# Patient Record
Sex: Female | Born: 1945 | Race: White | Hispanic: No | Marital: Married | Smoking: Never smoker
Health system: Southern US, Community
[De-identification: ages and names within clinical notes are randomized; demographics above are authoritative.]

## PROBLEM LIST (undated history)

## (undated) DIAGNOSIS — R112 Nausea with vomiting, unspecified: Secondary | ICD-10-CM

## (undated) DIAGNOSIS — G20 Parkinson's disease: Secondary | ICD-10-CM

## (undated) DIAGNOSIS — J45909 Unspecified asthma, uncomplicated: Secondary | ICD-10-CM

## (undated) DIAGNOSIS — J189 Pneumonia, unspecified organism: Secondary | ICD-10-CM

## (undated) DIAGNOSIS — M1990 Unspecified osteoarthritis, unspecified site: Secondary | ICD-10-CM

## (undated) DIAGNOSIS — G709 Myoneural disorder, unspecified: Secondary | ICD-10-CM

## (undated) DIAGNOSIS — E039 Hypothyroidism, unspecified: Secondary | ICD-10-CM

## (undated) DIAGNOSIS — Z9889 Other specified postprocedural states: Secondary | ICD-10-CM

## (undated) DIAGNOSIS — E079 Disorder of thyroid, unspecified: Secondary | ICD-10-CM

## (undated) DIAGNOSIS — G459 Transient cerebral ischemic attack, unspecified: Secondary | ICD-10-CM

## (undated) DIAGNOSIS — I639 Cerebral infarction, unspecified: Secondary | ICD-10-CM

## (undated) DIAGNOSIS — T783XXA Angioneurotic edema, initial encounter: Secondary | ICD-10-CM

## (undated) DIAGNOSIS — G20C Parkinsonism, unspecified: Secondary | ICD-10-CM

## (undated) DIAGNOSIS — L509 Urticaria, unspecified: Secondary | ICD-10-CM

## (undated) DIAGNOSIS — I10 Essential (primary) hypertension: Secondary | ICD-10-CM

## (undated) HISTORY — PX: TONSILLECTOMY: SUR1361

## (undated) HISTORY — DX: Disorder of thyroid, unspecified: E07.9

## (undated) HISTORY — DX: Urticaria, unspecified: L50.9

## (undated) HISTORY — PX: KNEE SURGERY: SHX244

## (undated) HISTORY — DX: Parkinson's disease: G20

## (undated) HISTORY — DX: Parkinsonism, unspecified: G20.C

## (undated) HISTORY — DX: Angioneurotic edema, initial encounter: T78.3XXA

## (undated) HISTORY — DX: Essential (primary) hypertension: I10

## (undated) HISTORY — PX: DILATION AND CURETTAGE OF UTERUS: SHX78

## (undated) HISTORY — DX: Transient cerebral ischemic attack, unspecified: G45.9

## (undated) HISTORY — PX: HYSTEROSCOPY: SHX211

---

## 1999-03-23 ENCOUNTER — Ambulatory Visit (HOSPITAL_COMMUNITY): Admission: RE | Admit: 1999-03-23 | Discharge: 1999-03-23 | Payer: Self-pay | Admitting: Neurological Surgery

## 1999-03-23 ENCOUNTER — Encounter: Payer: Self-pay | Admitting: Neurological Surgery

## 1999-04-01 ENCOUNTER — Encounter: Payer: Self-pay | Admitting: Neurological Surgery

## 1999-04-01 ENCOUNTER — Encounter: Admission: RE | Admit: 1999-04-01 | Discharge: 1999-04-01 | Payer: Self-pay | Admitting: Neurological Surgery

## 1999-05-12 HISTORY — PX: BACK SURGERY: SHX140

## 1999-05-14 ENCOUNTER — Encounter: Payer: Self-pay | Admitting: Neurological Surgery

## 1999-05-14 ENCOUNTER — Ambulatory Visit (HOSPITAL_COMMUNITY): Admission: RE | Admit: 1999-05-14 | Discharge: 1999-05-15 | Payer: Self-pay | Admitting: Neurological Surgery

## 1999-09-03 ENCOUNTER — Encounter: Payer: Self-pay | Admitting: Neurological Surgery

## 1999-09-03 ENCOUNTER — Ambulatory Visit (HOSPITAL_COMMUNITY): Admission: RE | Admit: 1999-09-03 | Discharge: 1999-09-03 | Payer: Self-pay | Admitting: Neurological Surgery

## 1999-11-21 ENCOUNTER — Encounter: Payer: Self-pay | Admitting: Neurological Surgery

## 1999-11-21 ENCOUNTER — Ambulatory Visit (HOSPITAL_COMMUNITY): Admission: RE | Admit: 1999-11-21 | Discharge: 1999-11-21 | Payer: Self-pay | Admitting: Neurological Surgery

## 1999-12-29 ENCOUNTER — Inpatient Hospital Stay (HOSPITAL_COMMUNITY): Admission: RE | Admit: 1999-12-29 | Discharge: 2000-01-05 | Payer: Self-pay | Admitting: Neurological Surgery

## 1999-12-29 ENCOUNTER — Encounter: Payer: Self-pay | Admitting: Neurological Surgery

## 1999-12-31 ENCOUNTER — Encounter: Payer: Self-pay | Admitting: Neurological Surgery

## 2000-01-19 ENCOUNTER — Encounter: Admission: RE | Admit: 2000-01-19 | Discharge: 2000-01-19 | Payer: Self-pay | Admitting: Neurological Surgery

## 2000-01-19 ENCOUNTER — Encounter: Payer: Self-pay | Admitting: Neurological Surgery

## 2000-03-12 ENCOUNTER — Encounter: Admission: RE | Admit: 2000-03-12 | Discharge: 2000-04-05 | Payer: Self-pay | Admitting: Neurological Surgery

## 2000-04-21 ENCOUNTER — Encounter: Payer: Self-pay | Admitting: Neurological Surgery

## 2000-04-21 ENCOUNTER — Encounter: Admission: RE | Admit: 2000-04-21 | Discharge: 2000-04-21 | Payer: Self-pay | Admitting: Neurological Surgery

## 2000-12-21 ENCOUNTER — Other Ambulatory Visit: Admission: RE | Admit: 2000-12-21 | Discharge: 2000-12-21 | Payer: Self-pay | Admitting: Obstetrics and Gynecology

## 2001-04-26 ENCOUNTER — Ambulatory Visit: Admission: RE | Admit: 2001-04-26 | Discharge: 2001-04-26 | Payer: Self-pay | Admitting: Gynecology

## 2001-04-26 ENCOUNTER — Encounter (INDEPENDENT_AMBULATORY_CARE_PROVIDER_SITE_OTHER): Payer: Self-pay | Admitting: Specialist

## 2001-11-08 HISTORY — PX: NECK SURGERY: SHX720

## 2001-12-21 ENCOUNTER — Other Ambulatory Visit: Admission: RE | Admit: 2001-12-21 | Discharge: 2001-12-21 | Payer: Self-pay | Admitting: Obstetrics and Gynecology

## 2002-09-07 ENCOUNTER — Ambulatory Visit (HOSPITAL_BASED_OUTPATIENT_CLINIC_OR_DEPARTMENT_OTHER): Admission: RE | Admit: 2002-09-07 | Discharge: 2002-09-07 | Payer: Self-pay | Admitting: Obstetrics and Gynecology

## 2002-09-07 ENCOUNTER — Encounter (INDEPENDENT_AMBULATORY_CARE_PROVIDER_SITE_OTHER): Payer: Self-pay | Admitting: Specialist

## 2003-01-12 ENCOUNTER — Other Ambulatory Visit: Admission: RE | Admit: 2003-01-12 | Discharge: 2003-01-12 | Payer: Self-pay | Admitting: Obstetrics and Gynecology

## 2003-01-16 ENCOUNTER — Encounter: Payer: Self-pay | Admitting: Neurological Surgery

## 2003-01-18 ENCOUNTER — Encounter: Payer: Self-pay | Admitting: Neurological Surgery

## 2003-01-18 ENCOUNTER — Inpatient Hospital Stay (HOSPITAL_COMMUNITY): Admission: RE | Admit: 2003-01-18 | Discharge: 2003-01-19 | Payer: Self-pay | Admitting: Neurological Surgery

## 2003-07-31 ENCOUNTER — Ambulatory Visit (HOSPITAL_COMMUNITY): Admission: RE | Admit: 2003-07-31 | Discharge: 2003-07-31 | Payer: Self-pay | Admitting: *Deleted

## 2004-01-17 ENCOUNTER — Other Ambulatory Visit: Admission: RE | Admit: 2004-01-17 | Discharge: 2004-01-17 | Payer: Self-pay | Admitting: Obstetrics and Gynecology

## 2004-11-05 ENCOUNTER — Ambulatory Visit (HOSPITAL_COMMUNITY): Admission: RE | Admit: 2004-11-05 | Discharge: 2004-11-05 | Payer: Self-pay | Admitting: Obstetrics and Gynecology

## 2004-11-05 ENCOUNTER — Ambulatory Visit (HOSPITAL_BASED_OUTPATIENT_CLINIC_OR_DEPARTMENT_OTHER): Admission: RE | Admit: 2004-11-05 | Discharge: 2004-11-05 | Payer: Self-pay | Admitting: Obstetrics and Gynecology

## 2004-11-05 ENCOUNTER — Encounter (INDEPENDENT_AMBULATORY_CARE_PROVIDER_SITE_OTHER): Payer: Self-pay | Admitting: *Deleted

## 2005-01-22 ENCOUNTER — Other Ambulatory Visit: Admission: RE | Admit: 2005-01-22 | Discharge: 2005-01-22 | Payer: Self-pay | Admitting: Obstetrics and Gynecology

## 2005-11-24 ENCOUNTER — Encounter: Admission: RE | Admit: 2005-11-24 | Discharge: 2005-11-24 | Payer: Self-pay | Admitting: Internal Medicine

## 2005-12-24 ENCOUNTER — Ambulatory Visit (HOSPITAL_BASED_OUTPATIENT_CLINIC_OR_DEPARTMENT_OTHER): Admission: RE | Admit: 2005-12-24 | Discharge: 2005-12-24 | Payer: Self-pay | Admitting: Obstetrics and Gynecology

## 2005-12-24 ENCOUNTER — Encounter (INDEPENDENT_AMBULATORY_CARE_PROVIDER_SITE_OTHER): Payer: Self-pay | Admitting: Specialist

## 2006-04-29 ENCOUNTER — Other Ambulatory Visit: Admission: RE | Admit: 2006-04-29 | Discharge: 2006-04-29 | Payer: Self-pay | Admitting: Obstetrics and Gynecology

## 2007-07-27 ENCOUNTER — Other Ambulatory Visit: Admission: RE | Admit: 2007-07-27 | Discharge: 2007-07-27 | Payer: Self-pay | Admitting: Obstetrics and Gynecology

## 2008-07-31 ENCOUNTER — Ambulatory Visit: Payer: Self-pay | Admitting: Obstetrics and Gynecology

## 2008-07-31 ENCOUNTER — Encounter: Payer: Self-pay | Admitting: Obstetrics and Gynecology

## 2008-07-31 ENCOUNTER — Other Ambulatory Visit: Admission: RE | Admit: 2008-07-31 | Discharge: 2008-07-31 | Payer: Self-pay | Admitting: Obstetrics and Gynecology

## 2008-08-01 ENCOUNTER — Ambulatory Visit: Payer: Self-pay | Admitting: Obstetrics and Gynecology

## 2008-08-07 ENCOUNTER — Ambulatory Visit: Payer: Self-pay | Admitting: Obstetrics and Gynecology

## 2008-08-08 ENCOUNTER — Ambulatory Visit: Payer: Self-pay | Admitting: Obstetrics and Gynecology

## 2009-03-06 ENCOUNTER — Ambulatory Visit (HOSPITAL_COMMUNITY): Admission: RE | Admit: 2009-03-06 | Discharge: 2009-03-06 | Payer: Self-pay | Admitting: Neurological Surgery

## 2009-03-06 ENCOUNTER — Encounter (INDEPENDENT_AMBULATORY_CARE_PROVIDER_SITE_OTHER): Payer: Self-pay | Admitting: Neurological Surgery

## 2009-03-06 ENCOUNTER — Ambulatory Visit: Payer: Self-pay | Admitting: Vascular Surgery

## 2009-05-11 DIAGNOSIS — I639 Cerebral infarction, unspecified: Secondary | ICD-10-CM

## 2009-05-11 HISTORY — DX: Cerebral infarction, unspecified: I63.9

## 2009-08-01 ENCOUNTER — Other Ambulatory Visit: Admission: RE | Admit: 2009-08-01 | Discharge: 2009-08-01 | Payer: Self-pay | Admitting: Obstetrics and Gynecology

## 2009-08-01 ENCOUNTER — Ambulatory Visit: Payer: Self-pay | Admitting: Obstetrics and Gynecology

## 2010-08-05 ENCOUNTER — Encounter: Payer: Self-pay | Admitting: Obstetrics and Gynecology

## 2010-08-06 ENCOUNTER — Other Ambulatory Visit: Payer: Self-pay | Admitting: Obstetrics and Gynecology

## 2010-08-06 ENCOUNTER — Encounter (INDEPENDENT_AMBULATORY_CARE_PROVIDER_SITE_OTHER): Payer: Medicare Other | Admitting: Obstetrics and Gynecology

## 2010-08-06 ENCOUNTER — Other Ambulatory Visit (HOSPITAL_COMMUNITY)
Admission: RE | Admit: 2010-08-06 | Discharge: 2010-08-06 | Disposition: A | Discharge status: Home / Self Care | Payer: Medicare Other | Source: Ambulatory Visit | Attending: Obstetrics and Gynecology | Admitting: Obstetrics and Gynecology

## 2010-08-06 DIAGNOSIS — M899 Disorder of bone, unspecified: Secondary | ICD-10-CM

## 2010-08-06 DIAGNOSIS — N952 Postmenopausal atrophic vaginitis: Secondary | ICD-10-CM

## 2010-08-06 DIAGNOSIS — Z124 Encounter for screening for malignant neoplasm of cervix: Secondary | ICD-10-CM

## 2010-08-06 DIAGNOSIS — N951 Menopausal and female climacteric states: Secondary | ICD-10-CM

## 2010-08-19 ENCOUNTER — Encounter (INDEPENDENT_AMBULATORY_CARE_PROVIDER_SITE_OTHER): Payer: Medicare Other

## 2010-08-19 DIAGNOSIS — M899 Disorder of bone, unspecified: Secondary | ICD-10-CM

## 2010-09-26 NOTE — Op Note (Signed)
NAME:  [name], Jeanette                     ACCOUNT NO.:  [id number]   MEDICAL RECORD NO.:  [id number]                   PATIENT TYPE:  AMB   LOCATION:  ENDO                                 FACILITY:  [facility]   PHYSICIAN:  Georgiana Spinner, M.D.                 DATE OF BIRTH:  12-26-1945   DATE OF PROCEDURE:  07/31/2003  DATE OF DISCHARGE:                                 OPERATIVE REPORT   PROCEDURE:  Colonoscopy.   INDICATIONS:  Constipation with colon cancer screening.   ANESTHESIA:  Demerol 100, Versed 7.5 mg.   PROCEDURE:  With the patient mildly sedated in the left lateral decubitus  position, the Olympus videoscopic colonoscope was inserted in the rectum and  passed under direct vision into the cecum, identified by the ileocecal valve  and appendiceal orifice, both of which were photographed.  At this point,  the colonoscope was slowly withdrawn, taking circumferential views of the  colonic mucosa, stopping only in the rectum, which appeared normal on direct  and showed hemorrhoids on retroflexed view.  The endoscope was straight and  withdrawn.  The patient's vital signs remained stable.  The patient  tolerated the procedure well with no apparent complications.   FINDINGS:  Scattered diverticulosis of right and left colon, internal  hemorrhoids, otherwise an unremarkable examination to the cecum.   PLAN:  The patient is to followup with me as an outpatient to discuss  certain problems with constipation and therapy directed towards that.                                               Georgiana Spinner, M.D.    GMO/MEDQ  D:  07/31/2003  T:  08/01/2003  Job:  [id number]   cc:   Reuel Boom L. Eda Paschal, M.D.  [address], [address]  [place]  [place] [address]  Fax: [phone]

## 2010-09-26 NOTE — Op Note (Signed)
Jeanette [name], DIENER               ACCOUNT NO.:  [id number]   MEDICAL RECORD NO.:  [id number]          PATIENT TYPE:  AMB   LOCATION:  [facility]                         FACILITY:  [facility]   PHYSICIAN:  Daniel L. Gottsegen, M.D.DATE OF BIRTH:  Aug 09, 1945   DATE OF PROCEDURE:  12/24/2005  DATE OF DISCHARGE:                                 OPERATIVE REPORT   PREOPERATIVE DIAGNOSIS:  Postmenopausal bleeding, probable endometrial  polyps.   POSTOPERATIVE DIAGNOSIS:  Postmenopausal bleeding with endometrial polyps.   OPERATIONS:  D and C, hysteroscopy, and excision of endometrial polyps.   SURGEON:  Daniel L. Eda Paschal, M.D.   ANESTHESIA:  General.   INDICATIONS:  The patient is a 65 year old female who presented to the  office with postmenopausal bleeding.  Endometrial biopsy was performed as  well as a saline infusion histogram.  She had a extremely thin endometrial  stripe of 17 mm, consistent with intrauterine pathology.  Although biopsy  was benign, it showed ___decidualization_______  which appeared to be  unusual in a postmenopausal woman not on progestins.  As a result of all the  above, she enters the hospital now for hysteroscopy and D and C.   FINDINGS:  External is normal.  BUS is normal.  Vaginal is normal.  Cervix  is clean.  Uterus is normal size and shape with first-degree uterine  descensus.  Adnexa fails to reveal masses.  At time of hysteroscopy, the  patient had multiple small endometrial polyps.  Once they were excised, the  patient had a normal postmenopausal endometrial lining without any other  pathology.   PROCEDURE:  After adequate general anesthesia, the patient was placed in the  dorsal supine position and prepped and draped in the usual sterile manner.  A single-tooth tenaculum was placed in the anterior lip of the cervix.  The  cervix was dilated to a No. 31 Pratt dilator, and then a hysteroscopic  resectoscope was introduced.  Camera was used for  magnification.  Sorbitol  3% was used to expand the intrauterine cavity.  Multiple small polyps were  obtained using the resectoscope set at 70 coag, 110 cutting, blend 1.  All  polyps were excised.  A fractional curettage was also performed.  All tissue  was sent to pathology for tissue diagnosis.  At the termination of the  procedure, there was no bleeding noted.  Fluid deficit was less than 100 cc  at approximately 70, and blood loss was minimal.  The patient left the  operating room in satisfactory condition.      Daniel L. Eda Paschal, M.D.  Electronically Signed     DLG/MEDQ  D:  12/24/2005  T:  12/24/2005  Job:  [id number]

## 2010-09-26 NOTE — Op Note (Signed)
NAME:  Jeanette [name], Jeanette [name]                     ACCOUNT NO.:  [id number]   MEDICAL RECORD NO.:  [id number]                   PATIENT TYPE:  AMB   LOCATION:  [facility]                                 FACILITY:  [facility]   PHYSICIAN:  Daniel L. Eda Paschal, M.D.           DATE OF BIRTH:  Sep 30, 1945   DATE OF PROCEDURE:  09/07/2002  DATE OF DISCHARGE:                                 OPERATIVE REPORT   PREOPERATIVE DIAGNOSIS:  Postmenopausal bleeding with abnormal endometrial  cavity.   POSTOPERATIVE DIAGNOSIS:  Postmenopausal bleeding with abnormal endometrial  cavity.   OPERATION:  Hysteroscopy D&C.   SURGEON:  Daniel L. Eda Paschal, M.D.   ANESTHESIA:  General.   INDICATIONS FOR PROCEDURE:  The patient is a 65 year old female who had  presented to the office with left lower quadrant pain, ultrasound was done  as a result of that. Although she did not have adnexal pathology, she did  have a very enlarged endometrial cavity for a post menopausal woman, it was  22 mm and it appeared to be that there was a solid growth in there. She also  had an intramural myoma. As a result of this, she was scheduled for  hysteroscopy D&C. The surgery was initially cancelled because she had  developed acute disk symptoms and she was stated on Celebrex and at that  point she started to have vaginal bleeding. This has been controlled with  Megace and she now enters for both diagnostic and therapeutic surgery.   PHYSICAL EXAMINATION:  External and vaginal is within normal limits. Cervix  is clean and somewhat stenotic. Uterus is enlarged by small myoma to about 7  or 8 week size. Adnexa are palpably normal. At the time of hysteroscopy, the  patient had a solid moss coming off the top of the fundus that when it was  removed appeared to be very fleshy and consistent with a benign endometrial  polyp. Curettings were heavy because of  her Megace. The uterus had first  degree uterine descensus.   DESCRIPTION OF  PROCEDURE:  After adequate general anesthesia, the patient  was placed in the dorsal lithotomy position, prepped and draped in the usual  sterile manner. A single tooth tenaculum was placed in the anterior lip of  the cervix. We had to start with very small dilators because of her cervical  stenosis but careful cautious progressive dilatation she was dilated to a  #33 Pratt dilator. The hysteroscopic resectoscope was introduced, it was  attached to 35 sorbitol to expand the intrauterine cavity. A 90 degree wire  loop was used with settings 70 coag, 110 cutting, blend 1. A camera was used  for magnification. The intrauterine cavity was seen. There were so much  curettings here that initially sharp curettage was done and then the polyp  could be identified, it was resected in about 5 or 6 bites using the 90  degree wire loop. She was then rehysteroscoped and she now  had a clean  cavity. Tissue was sent into separate specimens, endometrial curettings and  endometrial polyp. Estimated blood loss for the entire procedure was 50 mL  with none replaced. Fluid deficit was 150 mL. The patient tolerated the  procedure well and left the operating room in satisfactory condition.                                               Daniel L. Eda Paschal, M.D.   Tonette Bihari  D:  09/07/2002  T:  09/07/2002  Job:  [id number]

## 2010-09-26 NOTE — Consult Note (Signed)
[facility]  Patient:    Jeanette [name], Jeanette [name] Visit Number: [id number] MRN: [id number]          Service Type: GON Location: GYN Attending Physician:  Jeannette Corpus Dictated by:   Rande Brunt. Clarke-Pearson, M.D. Proc. Date: 04/26/01 Admit Date:  04/26/2001   CC:         Reuel Boom L. Eda Paschal, M.D.  Telford Nab, R.N.   Consultation Report  REASON FOR CONSULTATION:  [age]-year-old white female presents seeking a second opinion regarding management of ovarian cysts.  The patient had an ultrasound initially on August 28th in Dr. Reuel Boom L. Gottsegens office which revealed a myomatous uterus and a right adnexal cyst measuring 4.6 x 3.4 x 3.5 cm and a left adnexal cyst measuring 3.2 x 2.6 x 2.4 cm.  No free fluid was noted.  Followup ultrasound on November 20th showed that the right cyst had resolved; the left cyst was slightly larger, measuring now 5.2 x 3.7 x 3.7 cm with a thin septation.  The patient denies any pelvic pain, pressure or other GI or GU symptoms.  It is notable that the patients mother, in the postmenopausal time frame, has an early stage ovarian cancer.  Other gynecologic symptoms include the fact that patient has been having bleeding off and on for the last several months.  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  She notes that she was having bleeding on a somewhat regular basis for the last several years which she thought were periods.  However, since October, she has been bleeding nearly continuously.  It is noted that her endometrial stripe in August was 1.5 cm and in November was 12 cm.  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  She has had spinal surgery with fusions, placement of rods and  pins.  DRUG ALLERGIES:  COMPAZINE.  CURRENT MEDICATIONS:  Synthroid and two antihypertensive pills; the patient does not have them with her today.  FAMILY HISTORY:  Positive for mother with ovarian cancer.  REVIEW OF SYSTEMS:  Essentially negative.  PHYSICAL EXAMINATION:  VITAL SIGNS:  Weight 137 pounds.  Blood pressure 122/68, pulse 68, respiratory rate 18.  Height 5 feet 1 inch.  GENERAL:  The patient is a healthy white female in no acute distress.  HEENT:  Negative.  NECK:  Supple without thyromegaly.  NODES:  There is no supraclavicular or inguinal adenopathy.  ABDOMEN:  Soft and nontender.  No masses, organomegaly, ascites or herniae are noted although there is some fullness in the left lower quadrant.  PELVIC:  EGBUS normal.  Vagina is clean.  There is some menstrual blood present.  Cervix is normal.  Uterus is irregular in shape, consistent with fibroids, with aggregate size to approximately [redacted] weeks gestational size.  I do not feel any adnexal masses.  Rectovaginal exam confirms.  ULTRASOUND:  The patients ultrasound reports are reviewed.  IMPRESSION:  Relatively simple ovarian cyst in the left adnexa.  The right adnexa previously had had a cyst which resolved over three months of observation.  I believe these cysts are unlikely to represent a malignancy and would recommend that they be followed, since they are asymptomatic.  I would suggest she had a repeat ultrasound in approximately three months.  Uterine fibroids which apparently are asymptomatic unless they are  causing the patients bleeding.  Postmenopausal bleeding of questionable etiology with a thickened endometrial stripe.  After discussing this with the patient, she wishes to undertake a endometrial biopsy, which is obtained today.  The uterus sounds approximately 10 to 12 cm when performing the endometrial biopsy.  We will contact the patient with the biopsy report, as this may have some influence as  to her overall management.  She will return to Dr. Eda Paschal and have a repeat ultrasound. Dictated by:   Rande Brunt. Clarke-Pearson, M.D. Attending Physician:  Jeannette Corpus DD:  04/27/01 TD:  04/27/01 Job: [id number]

## 2010-09-26 NOTE — Discharge Summary (Signed)
. [facility]  Patient:    Jeanette [name], Jeanette [name]                  MRN: [id number] Adm. Date:  [id number] Disc. 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  DISCHARGE DIAGNOSES: 1. 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 2. Paralytic ileus with nausea and vomiting. 3. Postsurgical anemia.  CONDITION ON DISCHARGE:  Improved.  HOSPITAL COURSE:  The patient is a 65 year old individual who has had a severe degenerative spondylolisthesis with stenosis at L3-4 and L4-5.  After careful consideration of surgical intervention she was admitted and underwent an L3 and L4 laminectomy, posterior interbody arthrodesis, and supplemental fixation from L3 to L5 with local bone graft.  The patient tolerated the procedure well.  However, during the postoperative period she had a protracted period of a paralytic ileus which caused significant nausea and vomiting.  She was also noted to have a hemoglobin that went from a fairly normal level of 12.0 down to 7.5.  This maintained stable during her postoperative period, however, the patient did have some periods of light-headedness.  Mobilization was difficult in the early stages, however, after the third day the patient was gradually ambulated and tolerated this fairly well.  However, nausea and vomiting persisted until the sixth postoperative day the patient had continued problems with nausea.  Ultimately, she responded to laxatives and Fleets enema, and at the current time the patient is feeling well.  Her incision is clean and dry. Blood pressure has stabilized itself, though the patient has been advised to maintain herself off of her antihypertensive medications until she is seen in the office in about  three weeks time.  DISCHARGE MEDICATIONS:  The patient is given prescriptions for Percocet #60 without refills, Xanax 0.5 mg #40 without refills. DD:  01/05/00 TD:  01/05/00 Job: [id number]

## 2010-09-26 NOTE — Op Note (Signed)
Jeanette [name]                     ACCOUNT NO.:  [id number]   MEDICAL RECORD NO.:  [id number]                   PATIENT TYPE:  INP   LOCATION:  3011                                 FACILITY:  [facility]   PHYSICIAN:  Stefani Dama, M.D.               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   PROCEDURE:  Anterior cervical decompression and arthrodesis with structural  allograft, C5-6 and C6-7, Synthes plate fixation.   SURGEON:  Stefani Dama, M.D.   FIRST ASSISTANT:  Cristi Loron, M.D.   ANESTHESIA:  General endotracheal.   INDICATIONS:  [name] is a 65 year old individual who has had  significant neck, shoulder, and arm weakness, primarily in her right arm,  with decreased grip strength.  She has had severe spondylitic changes noted  at C5-6 and C6-7.  There is also moderate spondylosis at C4-5.  She notes  that she has numbing and tingling into the fingertips, and this process has  been progressively getting worse with plain radiographs and an MRI  confirming the presence of severe spondylitic degeneration with some  flattening of the ventral aspect of the cord and biforaminal stenosis, worse  at C5-6 but also present to a significant degree at C6-7.  She is taken to  the operating room.   DESCRIPTION OF PROCEDURE:  The patient was brought to the operating room and  placed on the operating table in supine position.  After the smooth  induction of general endotracheal anesthesia, she was placed in five pounds  of Holter traction and the neck was shaved, prepped with Duraprep, and  draped in a sterile fashion.  A transverse incision was made in the left  side of the neck and carried down through  the platysma.  The plain between  the sternocleidomastoid and the strap muscles was dissected bluntly until  the prevertebral space was reached with the first identifiable disk space  noted to be that of C5-6 on a localizing radiograph.  The dissection was  then carried over to the other side of the midline, stripping the longus  colli muscle from the prevertebral space.  A self-retaining Caspar retractor  was then placed in the wound and a diskectomy was undertaken at C5-6, first  removing large ventral osteophytes and then entering a severely collapsed  disk space.  The high-speed air drill and 2.3 mm dissecting tool were used  to enlarge the disk space to as to allow placement of a self-retaining disk  spreader.  This allowed for further opening of the interspace and then  drilling and then removal of some very desiccated cartilaginous end plate  material.  Once this was all removed  the area of the posterior longitudinal  ligament was examined.  A large posterior osteophyte from the inferior  margin of the body of C5 was drilled down with a 2.3 mm dissecting tool.  Dissection was carried out to the uncinate processes, where a large uncinate  spur from the edge of C6 was removed in an en bloc fashion with the bone  graft being saved for later use as bone graft.  This was first done on the  right side, then done on the left side. Then with the area being thinned  down to the posterior longitudinal ligament, this was opened with a 1 mm  Kerrison punch and then carefully the opening was enlarged to allow  placement of a 2 mm Kerrison punch so that the ligamentous material could be  taken out to the lateral recess, thus decompressing the C6 nerve root, which  was amply visualized in the foramen.  This was first done on the right and  then on the left.  Once the decompression was secured, care was taken to  check for hemostasis in the epidural spaces and when this was found to be  the case,  the end plates were drilled smooth and a trial fitting of a  6 mm  lordotic graft was attempted.  Further drilling was performed to allow snug  placement of this graft and then a graft was prepared by placing some of the  bone graft that had been harvested into the center of the fibular strut and  then tamping the graft into position.  Attention was then turned to C6-7,  where a similar procedure was carried out.  Again, here large uncinate spurs  were found on both sides, causing severe foraminal compression.  On the  right side there was noted to be some fat just beyond the takeoff of the C7  nerve root and once this was identified and the decompression was secured,  hemostasis was achieved and again a 6 mm lordotic graft was placed into the  interspace.  Then the traction was removed and the prevertebral space was  sized for a 34 mm plate, which was contoured to fit the ventral aspects of  the vertebral bodies and then secured with six locking 4 x 14 mm screws,  which were individually tapped and drilled.  Locking screws were placed.  A  final localizing radiograph identified good position of the surgical  construct.  The wound was copiously irrigated with antibiotic irrigating  solution.  Hemostasis in the soft tissues was meticulously obtained.  Dr.  Lovell Sheehan helped with the arthrodesis portion of this operation to help secure  fixation and placement of the plate and screws.  He also helped with the  very end of the decompression and placement of the two grafts  simultaneously.  With this the wound was closed with 3-0 Vicryl in the  platysma and 3-0 Vicryl in the subcuticular tissues.  Dermabond was placed  on the skin and the patient was returned to the recovery room in stable  condition.                                               Stefani Dama, M.D.    Merla Riches  D:  01/18/2003  T:  01/19/2003  Job:  [id number]

## 2010-09-26 NOTE — Op Note (Signed)
Jeanette [name], Jeanette [name]               ACCOUNT NO.:  [id number]   MEDICAL RECORD NO.:  [id number]          PATIENT TYPE:  AMB   LOCATION:  [facility]                         FACILITY:  [facility]   PHYSICIAN:  Daniel L. Gottsegen, M.D.DATE OF BIRTH:  October 24, 1945   DATE OF PROCEDURE:  11/05/2004  DATE OF DISCHARGE:                                 OPERATIVE REPORT   PREOPERATIVE DIAGNOSES:  1.  Postmenopausal bleeding.  2.  Endometrial polyp.   POSTOPERATIVE DIAGNOSES:  1.  Postmenopausal bleeding.  2.  Endometrial polyps.   OPERATION:  Hysteroscopy D&C, excision of endometrial polyp.   SURGEON:  Daniel L. Eda Paschal, M.D.   ANESTHESIA:  General.   INDICATIONS:  The patient is a 65 year old gravida 2, para 2, AB 0, who  presented to the office with two episodes of postmenopausal bleeding. On  ultrasound, there was an obvious endometrial polyp in the cavity. She now  enters the hospital for hysteroscopy and excision of the polyp.   FINDINGS:  External exam is normal. BUS is normal. Vaginal examination shows  1-1/2 degree cystocele. Cervix is clean. Uterus is retroverted, normal size  and shape. Adnexa are palpably normal.   PROCEDURE:  After adequate general anesthesia, the patient was placed in the  dorsal supine position, prepped and draped in usual sterile manner. A single-  tooth tenaculum was placed in the anterior lip of the cervix. The cervix was  dilated to #33 [facility] dilator. A hysteroscopic resectoscope was introduced.  It was attached to a camera for magnification and 3% sorbitol was used to  expand the intrauterine cavity. Pictures were taken for documentation.  Findings were two endometrial polyps. One was at the top of the fundus more  to the right and the other was in the lower uterine segment at the top of  the fundus. Each polyp was approximately 1.5 cm. Once these had been  excised, there was no other pathology present. Using a 90-degree wire loop  with settings of 70 coag,  110 cutting, blend 1, both polyps were completely  excised and sent to pathology for tissue diagnosis. There was a little bit  of oozing which was controlled with the wire loop. At the termination of  procedure, there was no bleeding noted. Total blood loss was minimal. Fluid  deficit was difficult to measure because there was a leak from the tubing  but it was not felt to be excessive. The patient left the operating room in  satisfactory condition.     DLG/MEDQ  D:  11/05/2004  T:  11/05/2004  Job:  [id number]

## 2010-09-26 NOTE — H&P (Signed)
[place] - Inpatient  Patient:    Jeanette [name], Jeanette [name]                  MRN: [id number] Adm. 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  HISTORY OF PRESENT ILLNESS:  The patient is a 65 year old individual who has been suffering with back and bilateral leg pain.  She was initially evaluated in 1997. She had some modest symptoms at that time. She also was noted to have some modest dorsiflexor weakness.  The patient was seen again in November and her workup has been ongoing since that time. She has been having episodes of daily pain without relief despite efforts at conservative treatment.  The patient ultimately had required myelography and that study revealed that he had a transitional level at the L5 sacrum level and at that two mobile segments that would be L3-4 and L4-5 had marked spondylolisthesis with stenosis.  Surgical approach was discussed in the office on November 27, 1999.  I indicated that the patient would require decompression with laminectomy and iliac crest bone graft arthrodesis. Posterior interbody technique would be chosen.  Aspects of the surgery were discussed in detail and the patient is ready and prepared to undergo decompression and stabilization at this time.  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  ALLERGIES:  Compazine.  CURRENT MEDICATIONS: Prempro 0.625 mg/2.5 mg daily.  Synthroid 0.88 mg  daily. Atenolol 25 mg a day.  Altace 5 mg a day.  SOCIAL HISTORY:  She does not smoke. She does not drink alcohol.  Height and weight have been stable at 130 pounds, 5 feet 1-1/2 inches.  FAMILY HISTORY:  Reveals that her mother had polycythemia and her father had heart disease and died age 67.  REVIEW OF SYSTEMS:  Notable for the items in the history of present plus hypertension.  The patient had a chest x-ray on February 18, 1999 which was normal.  PHYSICAL EXAMINATION:  GENERAL: She is alert, oriented, cooperative individual in no overt distress.  BACK:  Range of motion of her back reveals that she can flex forward normally. She extends normally.  Palpation and percussion of her back reproduces no tenderness.  NECK:  Range of motion in her neck reveals that she can turn 45 degrees to the right and 6 degrees to the left.  She can extend and flex normally.  Axial compression does not reproduce symptoms.  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  EXTREMITIES:  Motor strength in the upper extremities reveals the deltoids, biceps, triceps, grips, and intrinsics have normal strength, tone and bulk to confrontation.  Deep tendon reflexes are absent in the biceps, 1+ in the triceps, 1+ in the brachioradialis.  Sensation is intact distally in the upper extremities.  The lower extremity strength is normal to confrontational strength; however, the tibialis anterior do have a slight suggestion of weakness a little worse on the left than the right, 4/5.  Straight leg raising is negative to 80 degrees bilaterally.  Patricks maneuver is negative bilaterally also.  NEUROLOGIC:  Cranial nerve examination reveals the pupils are 3 mm and briskly reactive to light and accommodation.  The extraocular movements are full. Face is symmetric to  grimace.  Tongue and uvula are in the midline.  Sclerae and conjunctivae are clear.  NECK:  Reveals no masses and no bruits are heard.  LUNGS: Clear to auscultation.  HEART:  Regular rate and rhythm and no murmurs appreciated.  ABDOMEN:  Soft.  Bowel sounds are positive.  No masses are palpable.  EXTREMITIES:  Reveal no cyanosis, clubbing or edema.  IMPRESSION:  The patient has evidence of a severe spondylolisthesis with stenosis at L3-4 and L4-5, transitional L5-S1 segment.  She is now admitted to undergo surgical decompression, stabilization using posterior interbody technique plus pedicular fixation and posterior lateral grafting. DD:  12/29/99 TD:  12/29/99 Job: [id number]

## 2010-09-26 NOTE — Op Note (Signed)
[place]  Patient:    Jeanette [name], Jeanette [name]                  MRN: [id number] Proc. Date: 12/29/99 Adm. Date:  [id number] Attending:  Jonne Ply                           Operative Report  PREOPERATIVE DIAGNOSIS:  Lumbar spondylolisthesis and stenosis with lumbar radiculopathy, L3-4 and L4-5.  POSTOPERATIVE DIAGNOSIS:  Lumbar spondylolisthesis and stenosis with lumbar radiculopathy, L3-4 and L4-5.  PROCEDURE: 1. Lumbar Gill procedure, L4 and L3. 2. Posterior interbody arthrodesis with Branigan cage and local autograft    pedicular fixation, L3, L4, L5. 3. Posterolateral grafting with local autograft and Vitox bone scaffold.  SURGEON:  Stefani Dama, M.D.  FIRST ASSISTANT:  Danae Orleans. Venetia Maxon, M.D.  ANESTHESIA:  General endotracheal.  INDICATIONS:  The patient is a 65 year old individual who has had significant back and bilateral leg pain with claudication.  She was found to have a severe spondylolisthesis and stenosis at L3-4 and L4-5.  She was advised regarding surgical decompression and stabilization, and was taken to the operating room for this procedure.  PROCEDURE:  The patient was brought to the operating room supine on the stretcher.  After smooth induction of general endotracheal anesthesia she was turned prone.  Back was shaved, prepped with Duraprep and draped in a sterile fashion.  A midline incision was created and carried down to the lumbodorsal fascia which was opened on either side of the midline.  Dissection was carried out to expose the facet joints at L4-5 and 3-4, and each of these facets was noted to be markedly overgrown with degenerative spondylolisthesis.  Pars defect was not particularly identified, however, decompressive procedure was then performed consistent with a Gill procedure at L4-5 and L3-4.  Care was taken to protect the lateral nerve roots at the L4-5 level and also at L3-4. Once this  decompression was achieved, complete diskectomies were performed at L3-4 and L4-5.  Pedicle entry sites were then chosen at L3, L4, and L5, and these areas were instrumented with 6.75 x 40 mm screws from the spinal 90 defect.  An 80 mm rod was then contoured and placed between these pedicles. Prior to doing this the lateral transverse processes from L3 to L5 were decorticated in a subperiosteal fascia.  The disk space being evacuated the space was then instrumented with Branigan cages after reaming, decorticating, drilling, and then using the broach system to tap out square holes 11 x 11 mm in L4-5 and 9 x 9 mm in L3-4.  With the cages being inserted they were tamped to the ventral aspect of the interspace.  They had been packed with cancellous autograft that was harvested from the laminectomy and Gill procedure at L3-4 and L4-5.  Once the cages were placed the construct was then placed in slight lordosis and locked into position.  Lateral intertransverse spaces were then plaqued with remaining local autograft, packed with Vitox bone scaffold. Once this was completed the area was checked for hemostasis.  The exits of the nerve roots at L3, L4, and L5 were checked and found to be well decompressed. Hemostasis from the soft tissues was applied and checked, and then the lumbar dorsal fascia was closed with #1 Vicryl in interrupted fashion along with some PDS supplement, 2-0 Vicryl was used in the subcutaneous tissues, and 3-0 Vicryl was used subcuticularly.  The patient tolerated the procedure well. Blood loss was estimated at 650 cc.  A unit of cell saver blood was able to be garnered from the patients bleeding. DD:  12/29/99 TD:  12/29/99 Job: [id number]

## 2010-10-21 ENCOUNTER — Other Ambulatory Visit: Payer: Self-pay | Admitting: Dermatology

## 2010-11-20 ENCOUNTER — Other Ambulatory Visit: Payer: Self-pay | Admitting: Neurological Surgery

## 2010-11-20 DIAGNOSIS — M4712 Other spondylosis with myelopathy, cervical region: Secondary | ICD-10-CM

## 2010-11-22 ENCOUNTER — Ambulatory Visit
Admission: RE | Admit: 2010-11-22 | Discharge: 2010-11-22 | Disposition: A | Discharge status: Home / Self Care | Payer: Medicare Other | Source: Ambulatory Visit | Attending: Neurological Surgery | Admitting: Neurological Surgery

## 2010-11-22 DIAGNOSIS — M4712 Other spondylosis with myelopathy, cervical region: Secondary | ICD-10-CM

## 2010-12-30 ENCOUNTER — Other Ambulatory Visit (HOSPITAL_COMMUNITY): Payer: Self-pay | Admitting: Neurological Surgery

## 2010-12-30 ENCOUNTER — Encounter (HOSPITAL_COMMUNITY)
Admission: RE | Admit: 2010-12-30 | Discharge: 2010-12-30 | Disposition: A | Discharge status: Home / Self Care | Payer: Medicare Other | Source: Ambulatory Visit | Attending: Neurological Surgery | Admitting: Neurological Surgery

## 2010-12-30 ENCOUNTER — Ambulatory Visit (HOSPITAL_COMMUNITY)
Admission: RE | Admit: 2010-12-30 | Discharge: 2010-12-30 | Disposition: A | Discharge status: Home / Self Care | Payer: Medicare Other | Source: Ambulatory Visit | Attending: Neurological Surgery | Admitting: Neurological Surgery

## 2010-12-30 DIAGNOSIS — M47812 Spondylosis without myelopathy or radiculopathy, cervical region: Secondary | ICD-10-CM

## 2010-12-30 DIAGNOSIS — Z01812 Encounter for preprocedural laboratory examination: Secondary | ICD-10-CM | POA: Insufficient documentation

## 2010-12-30 DIAGNOSIS — Z01811 Encounter for preprocedural respiratory examination: Secondary | ICD-10-CM | POA: Insufficient documentation

## 2010-12-30 LAB — COMPREHENSIVE METABOLIC PANEL
ALT: 14 U/L (ref 0–35)
Potassium: 5 mEq/L (ref 3.5–5.1)

## 2010-12-30 LAB — CBC
HCT: 42.7 % (ref 36.0–46.0)
Hemoglobin: 13.8 g/dL (ref 12.0–15.0)
MCH: 29.3 pg (ref 26.0–34.0)
MCHC: 32.3 g/dL (ref 30.0–36.0)
Platelets: 279 10*3/uL (ref 150–400)
RBC: 4.71 MIL/uL (ref 3.87–5.11)
RDW: 12.9 % (ref 11.5–15.5)

## 2010-12-30 LAB — SURGICAL PCR SCREEN: MRSA, PCR: NEGATIVE

## 2011-01-08 ENCOUNTER — Ambulatory Visit (HOSPITAL_COMMUNITY)
Admission: RE | Admit: 2011-01-08 | Discharge: 2011-01-09 | Disposition: A | Discharge status: Home / Self Care | Payer: Medicare Other | Source: Ambulatory Visit | Attending: Neurological Surgery | Admitting: Neurological Surgery

## 2011-01-08 ENCOUNTER — Inpatient Hospital Stay (HOSPITAL_COMMUNITY): Payer: Medicare Other

## 2011-01-08 DIAGNOSIS — Z981 Arthrodesis status: Secondary | ICD-10-CM | POA: Insufficient documentation

## 2011-01-08 DIAGNOSIS — I10 Essential (primary) hypertension: Secondary | ICD-10-CM | POA: Insufficient documentation

## 2011-01-08 DIAGNOSIS — M4712 Other spondylosis with myelopathy, cervical region: Principal | ICD-10-CM | POA: Insufficient documentation

## 2011-01-08 DIAGNOSIS — I69920 Aphasia following unspecified cerebrovascular disease: Secondary | ICD-10-CM | POA: Insufficient documentation

## 2011-01-15 NOTE — Op Note (Signed)
NAMEAKEISHA, LAGERQUIST               ACCOUNT NO.:  [id number]  MEDICAL RECORD NO.:  [id number]  LOCATION:  3526                         FACILITY:  [facility]  PHYSICIAN:  Stefani Dama, M.D.  DATE OF BIRTH:  [birth year]  DATE OF PROCEDURE:  01/08/2011 DATE OF DISCHARGE:                              OPERATIVE REPORT   PREOPERATIVE DIAGNOSIS:  Cervical spondylosis with myelopathy C4-C5 status post anterior decompression and fusion C5 through C7.  POSTOPERATIVE DIAGNOSIS:  Cervical spondylosis with myelopathy C4-C5 status post anterior decompression and fusion C5 through C7.  PROCEDURES:  Anterior cervical decompression C4-C5, arthrodesis with structural allograft and Alphatec plate fixation, removal of old anterior cervical plate.  SURGEON:  Stefani Dama, MD  FIRST ASSISTANT:  Danae Orleans. Venetia Maxon, MD  ANESTHESIA:  General endotracheal.  INDICATIONS:  Charnee Turnipseed is a 65 year old individual who has had significant spondylitic disease in the neck in the past.  Over the past year, she has developed increasing neck pain which shoulder dysesthesias and pain particularly in the region of right shoulder and right arm.  She has generalized weakness.  She was found to have cord compression at the level of C4-C5 with severe spondylitic degeneration of that disk in addition to severe stenosis causing the myelopathy.  The patient was advised regarding need for surgery.  DESCRIPTION OF PROCEDURE:  The patient was brought to the operating room supine on the stretcher.  After smooth induction of general endotracheal anesthesia, she was placed in 5 pounds of halter traction.  Neck was prepped with alcohol and DuraPrep and draped in a sterile fashion. Transverse incision was created in the superior portion of the neck on the left side and dissection was carried down through the platysma.  The plane between the sternocleidomastoid and the strap muscles was dissected bluntly until the  prevertebral space was reached.  First identifiable disk space was noted to be that of C4-C5 and this was identified by identifying the previously placed plate and working to cephalad to it.  The plate was noted to be high in the C5 vertebral body.  Further dissection uncovered the disk space and the self- retaining Caspar retractor could be then placed into the wound. Diskectomy was performed at C4-C5 and entirety of the severely degenerating and desiccated disk material was removed.  As a region of posterior longitudinal ligament was reached, careful dissection of the subligamentous space under the vertebral body of C4 yielded a plane that allowed removal of the posterior longitudinal ligament.  The common dural tube was identified.  Care was taken to decompress out laterally and on the right side and on the left side, a large uncinate spur was removed allowing further decompression along the path of the nerve root of C5 into either lateral recess.  Once this was decompressed, hemostasis was established using a bipolar cautery and some small pledgets of Gelfoam soaked in thrombin.  A 7-mm trans graft was then fitted to the interspace and sized and shaped appropriately.  Because the patient's tissues were noted to be rather edematous, it was chosen not to dissect all the way down to the bone at C7.  We used a metal cutting drill bit  to cut the plate just below its attachment with the C5 vertebra.  This was done by cottonoid protecting all the soft tissue around the area from metal shavings and then drilling across the plate. The screws were then removed from the vertebral body and the plate was lifted and the area was inspected.  A 14-mm standard size Alphatec plate of the Trestle variety was then chosen and it was secured with 4 locking variable-angled 12-mm screws.  Once these were locked into position, the wound was irrigated copiously with antibiotic irrigating solution.   Some additional demineralized bone matrix which was placed in the 7-mm graft that was in the interspace at C4-C5 was then packed around the outside of the graft.  Once hemostasis was established, the retractors were removed and the platysma was closed with 3-0 Vicryl in interrupted fashion, 3-0 Vicryl was used in the subcuticular tissues, Dermabond was used in the skin.  Intraoperative radiograph identified good position of the surgical construct.  The patient tolerated the procedure well and was returned to recovery room in stable condition.     Stefani Dama, M.D.     Merla Riches  D:  01/08/2011  T:  01/08/2011  Job:  [id number]  Electronically Signed by Barnett Abu M.D. on 01/15/2011 03:17:46 PM

## 2011-01-15 NOTE — H&P (Signed)
[name], [name]               ACCOUNT NO.:  [id number]  MEDICAL RECORD NO.:  [id number]  LOCATION:  3526                         FACILITY:  [facility]  PHYSICIAN:  Stefani Dama, M.D.  DATE OF BIRTH:  [birth year]  DATE OF ADMISSION:  01/08/2011 DATE OF DISCHARGE:                             HISTORY & PHYSICAL   ADMITTING DIAGNOSIS:  Cervical spondylosis with myelopathy C4-C5 status post arthrodesis C5 through C7 years ago.  HISTORY OF PRESENT ILLNESS:  Jeanette [name] is a 65 year old individual whom I have known for many years.  She has had previous cervical spondylitic disease and was found to have severe lumbar spondylitic disease as early as 34.  In 2001, I did a surgical decompression of the lower lumbar spine at L3-L4, L4-L5 with decompression and stabilization.  The patient tolerated that procedure well, but then a few years later, she developed some cervical spondylitic myelopathy at C5-C6 and C6-C7 with radicular changes.  She underwent anterior cervical decompression and arthrodesis and seemed to tolerate this well.  In the last year, she has developed substantial neck pain, shoulder pain, and right arm pain.  She has evidence of advanced spondylitic degeneration on a recently performed MRI which demonstrates that there is cord compression at C4-C5 and biforaminal stenosis with an anterolisthesis of C4 on C5 essentially adjacent level disease at the level above her fusion.  The patient has been advised regarding the need for surgical intervention to decompress and stabilize the C4-C5 level and this is now being planned.  Her past medical history reveals that her general health has been good. 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  She had been treated at the [facility] as a younger girl for this process and it was noted that she has one leg  on the left side that is shorter than the right.  Allergies to medications include COMPAZINE.  Her current medications include: 1. Over-the-counter multivitamin. 2. Aspirin 81 mg a day. 3. Venlafaxine 37.5 mg daily. 4. Synthroid 75 mcg per day. 5. Metoprolol XL 50 mg per day.  Social history reveals that she does not smoke nor does she use any alcohol.  Height and weight have been stable.  Family history reveals that her mother had polycythemia and her father had heart disease and died at age 69.  Systems review is notable for all the items in the history of present illness plus complains of shoulder pain and arm pain.  She has a history of hypertension on 14-point review sheet.  Chest x-ray back in 2000, was normal.  PHYSICAL EXAMINATION:  GENERAL:  She is alert, oriented, and cooperative individual in no overt distress. NECK:  Range of motion of her neck is limited with a positive Spurling maneuver 15 degrees to the right side.  She can extend approximately 50% of normal, has rotation of approximately 50% of normal. NEUROLOGIC:  Her motor strength in the upper extremities reveals the deltoids have some give-way weakness on the right side at 4/5, biceps strength is 5/5 bilaterally, triceps are 5/5 bilaterally, intrinsic strength is 5/5  bilaterally.  Lower extremity strength is good in the iliopsoas, quadriceps, tibialis, anterior and gastrocs.  Deep tendon reflexes are 2+ in the patellae, 2+ in the Achilles.  Babinski are downgoing.  Sensation is intact distally in the upper extremities. Cranial nerve examination reveals that facial sensation is symmetric and all other cranial nerves are intact.  Station and gait are normal. LUNGS:  Clear to auscultation. HEART:  Regular rate and rhythm. ABDOMEN:  Soft.  Bowel sounds are positive.  No masses are palpable. EXTREMITIES:  No cyanosis, clubbing, or edema.  IMPRESSION:  The patient has evidence of spondylitic myelopathy at  C4-C5 secondary to cord compression from a marked advanced degenerative changes along with a spondylolisthesis at that level.  The patient has been advised regarding the need for surgical decompression and arthrodesis at the level of C4-C5.     Stefani Dama, M.D.     Merla Riches  D:  01/08/2011  T:  01/08/2011  Job:  [id number]  Electronically Signed by Barnett Abu M.D. on 01/15/2011 03:17:51 PM

## 2011-03-13 ENCOUNTER — Other Ambulatory Visit: Payer: Self-pay | Admitting: Neurological Surgery

## 2011-03-13 DIAGNOSIS — M25511 Pain in right shoulder: Secondary | ICD-10-CM

## 2011-03-14 ENCOUNTER — Ambulatory Visit
Admission: RE | Admit: 2011-03-14 | Discharge: 2011-03-14 | Disposition: A | Discharge status: Home / Self Care | Payer: Medicare Other | Source: Ambulatory Visit | Attending: Neurological Surgery | Admitting: Neurological Surgery

## 2011-03-14 DIAGNOSIS — M25511 Pain in right shoulder: Secondary | ICD-10-CM

## 2011-06-04 DIAGNOSIS — M75120 Complete rotator cuff tear or rupture of unspecified shoulder, not specified as traumatic: Secondary | ICD-10-CM | POA: Diagnosis not present

## 2011-06-19 DIAGNOSIS — M24119 Other articular cartilage disorders, unspecified shoulder: Secondary | ICD-10-CM | POA: Diagnosis not present

## 2011-06-19 DIAGNOSIS — M75120 Complete rotator cuff tear or rupture of unspecified shoulder, not specified as traumatic: Secondary | ICD-10-CM | POA: Diagnosis not present

## 2011-06-19 DIAGNOSIS — X58XXXA Exposure to other specified factors, initial encounter: Secondary | ICD-10-CM | POA: Diagnosis not present

## 2011-06-19 DIAGNOSIS — S46819A Strain of other muscles, fascia and tendons at shoulder and upper arm level, unspecified arm, initial encounter: Secondary | ICD-10-CM | POA: Diagnosis not present

## 2011-06-19 DIAGNOSIS — S43499A Other sprain of unspecified shoulder joint, initial encounter: Secondary | ICD-10-CM | POA: Diagnosis not present

## 2011-06-19 DIAGNOSIS — M67919 Unspecified disorder of synovium and tendon, unspecified shoulder: Secondary | ICD-10-CM | POA: Diagnosis not present

## 2011-06-19 DIAGNOSIS — M9420 Chondromalacia, unspecified site: Secondary | ICD-10-CM | POA: Diagnosis not present

## 2011-06-19 DIAGNOSIS — M75100 Unspecified rotator cuff tear or rupture of unspecified shoulder, not specified as traumatic: Secondary | ICD-10-CM | POA: Diagnosis not present

## 2011-06-23 DIAGNOSIS — M25619 Stiffness of unspecified shoulder, not elsewhere classified: Secondary | ICD-10-CM | POA: Diagnosis not present

## 2011-06-23 DIAGNOSIS — M6281 Muscle weakness (generalized): Secondary | ICD-10-CM | POA: Diagnosis not present

## 2011-06-23 DIAGNOSIS — M25519 Pain in unspecified shoulder: Secondary | ICD-10-CM | POA: Diagnosis not present

## 2011-06-30 DIAGNOSIS — M25519 Pain in unspecified shoulder: Secondary | ICD-10-CM | POA: Diagnosis not present

## 2011-06-30 DIAGNOSIS — M25619 Stiffness of unspecified shoulder, not elsewhere classified: Secondary | ICD-10-CM | POA: Diagnosis not present

## 2011-06-30 DIAGNOSIS — M6281 Muscle weakness (generalized): Secondary | ICD-10-CM | POA: Diagnosis not present

## 2011-07-07 DIAGNOSIS — M25619 Stiffness of unspecified shoulder, not elsewhere classified: Secondary | ICD-10-CM | POA: Diagnosis not present

## 2011-07-07 DIAGNOSIS — M25519 Pain in unspecified shoulder: Secondary | ICD-10-CM | POA: Diagnosis not present

## 2011-07-07 DIAGNOSIS — M6281 Muscle weakness (generalized): Secondary | ICD-10-CM | POA: Diagnosis not present

## 2011-07-10 DIAGNOSIS — M25619 Stiffness of unspecified shoulder, not elsewhere classified: Secondary | ICD-10-CM | POA: Diagnosis not present

## 2011-07-10 DIAGNOSIS — M25519 Pain in unspecified shoulder: Secondary | ICD-10-CM | POA: Diagnosis not present

## 2011-07-10 DIAGNOSIS — M6281 Muscle weakness (generalized): Secondary | ICD-10-CM | POA: Diagnosis not present

## 2011-07-15 DIAGNOSIS — M6281 Muscle weakness (generalized): Secondary | ICD-10-CM | POA: Diagnosis not present

## 2011-07-15 DIAGNOSIS — M25519 Pain in unspecified shoulder: Secondary | ICD-10-CM | POA: Diagnosis not present

## 2011-07-15 DIAGNOSIS — M25619 Stiffness of unspecified shoulder, not elsewhere classified: Secondary | ICD-10-CM | POA: Diagnosis not present

## 2011-07-17 DIAGNOSIS — M6281 Muscle weakness (generalized): Secondary | ICD-10-CM | POA: Diagnosis not present

## 2011-07-17 DIAGNOSIS — M25519 Pain in unspecified shoulder: Secondary | ICD-10-CM | POA: Diagnosis not present

## 2011-07-17 DIAGNOSIS — M25619 Stiffness of unspecified shoulder, not elsewhere classified: Secondary | ICD-10-CM | POA: Diagnosis not present

## 2011-07-21 DIAGNOSIS — M25519 Pain in unspecified shoulder: Secondary | ICD-10-CM | POA: Diagnosis not present

## 2011-07-21 DIAGNOSIS — M6281 Muscle weakness (generalized): Secondary | ICD-10-CM | POA: Diagnosis not present

## 2011-07-21 DIAGNOSIS — M25619 Stiffness of unspecified shoulder, not elsewhere classified: Secondary | ICD-10-CM | POA: Diagnosis not present

## 2011-07-24 DIAGNOSIS — M25619 Stiffness of unspecified shoulder, not elsewhere classified: Secondary | ICD-10-CM | POA: Diagnosis not present

## 2011-07-24 DIAGNOSIS — M25519 Pain in unspecified shoulder: Secondary | ICD-10-CM | POA: Diagnosis not present

## 2011-07-24 DIAGNOSIS — M6281 Muscle weakness (generalized): Secondary | ICD-10-CM | POA: Diagnosis not present

## 2011-07-28 DIAGNOSIS — M25519 Pain in unspecified shoulder: Secondary | ICD-10-CM | POA: Diagnosis not present

## 2011-07-28 DIAGNOSIS — M25619 Stiffness of unspecified shoulder, not elsewhere classified: Secondary | ICD-10-CM | POA: Diagnosis not present

## 2011-07-28 DIAGNOSIS — M6281 Muscle weakness (generalized): Secondary | ICD-10-CM | POA: Diagnosis not present

## 2011-07-31 DIAGNOSIS — M25619 Stiffness of unspecified shoulder, not elsewhere classified: Secondary | ICD-10-CM | POA: Diagnosis not present

## 2011-07-31 DIAGNOSIS — M6281 Muscle weakness (generalized): Secondary | ICD-10-CM | POA: Diagnosis not present

## 2011-07-31 DIAGNOSIS — M25519 Pain in unspecified shoulder: Secondary | ICD-10-CM | POA: Diagnosis not present

## 2011-08-04 DIAGNOSIS — M6281 Muscle weakness (generalized): Secondary | ICD-10-CM | POA: Diagnosis not present

## 2011-08-04 DIAGNOSIS — M25519 Pain in unspecified shoulder: Secondary | ICD-10-CM | POA: Diagnosis not present

## 2011-08-04 DIAGNOSIS — M25619 Stiffness of unspecified shoulder, not elsewhere classified: Secondary | ICD-10-CM | POA: Diagnosis not present

## 2011-08-06 DIAGNOSIS — M6281 Muscle weakness (generalized): Secondary | ICD-10-CM | POA: Diagnosis not present

## 2011-08-06 DIAGNOSIS — M25619 Stiffness of unspecified shoulder, not elsewhere classified: Secondary | ICD-10-CM | POA: Diagnosis not present

## 2011-08-06 DIAGNOSIS — M25519 Pain in unspecified shoulder: Secondary | ICD-10-CM | POA: Diagnosis not present

## 2011-08-13 DIAGNOSIS — M25619 Stiffness of unspecified shoulder, not elsewhere classified: Secondary | ICD-10-CM | POA: Diagnosis not present

## 2011-08-13 DIAGNOSIS — M25519 Pain in unspecified shoulder: Secondary | ICD-10-CM | POA: Diagnosis not present

## 2011-08-13 DIAGNOSIS — M6281 Muscle weakness (generalized): Secondary | ICD-10-CM | POA: Diagnosis not present

## 2011-08-14 DIAGNOSIS — M6281 Muscle weakness (generalized): Secondary | ICD-10-CM | POA: Diagnosis not present

## 2011-08-14 DIAGNOSIS — M25619 Stiffness of unspecified shoulder, not elsewhere classified: Secondary | ICD-10-CM | POA: Diagnosis not present

## 2011-08-14 DIAGNOSIS — M25519 Pain in unspecified shoulder: Secondary | ICD-10-CM | POA: Diagnosis not present

## 2011-08-18 DIAGNOSIS — M25619 Stiffness of unspecified shoulder, not elsewhere classified: Secondary | ICD-10-CM | POA: Diagnosis not present

## 2011-08-18 DIAGNOSIS — M25519 Pain in unspecified shoulder: Secondary | ICD-10-CM | POA: Diagnosis not present

## 2011-08-18 DIAGNOSIS — M6281 Muscle weakness (generalized): Secondary | ICD-10-CM | POA: Diagnosis not present

## 2011-08-20 DIAGNOSIS — M6281 Muscle weakness (generalized): Secondary | ICD-10-CM | POA: Diagnosis not present

## 2011-08-20 DIAGNOSIS — M25619 Stiffness of unspecified shoulder, not elsewhere classified: Secondary | ICD-10-CM | POA: Diagnosis not present

## 2011-08-20 DIAGNOSIS — M25519 Pain in unspecified shoulder: Secondary | ICD-10-CM | POA: Diagnosis not present

## 2011-08-25 DIAGNOSIS — M6281 Muscle weakness (generalized): Secondary | ICD-10-CM | POA: Diagnosis not present

## 2011-08-25 DIAGNOSIS — M25519 Pain in unspecified shoulder: Secondary | ICD-10-CM | POA: Diagnosis not present

## 2011-08-25 DIAGNOSIS — M25619 Stiffness of unspecified shoulder, not elsewhere classified: Secondary | ICD-10-CM | POA: Diagnosis not present

## 2011-08-27 DIAGNOSIS — M25619 Stiffness of unspecified shoulder, not elsewhere classified: Secondary | ICD-10-CM | POA: Diagnosis not present

## 2011-08-27 DIAGNOSIS — M6281 Muscle weakness (generalized): Secondary | ICD-10-CM | POA: Diagnosis not present

## 2011-08-27 DIAGNOSIS — M25519 Pain in unspecified shoulder: Secondary | ICD-10-CM | POA: Diagnosis not present

## 2011-08-31 DIAGNOSIS — M25619 Stiffness of unspecified shoulder, not elsewhere classified: Secondary | ICD-10-CM | POA: Diagnosis not present

## 2011-08-31 DIAGNOSIS — M6281 Muscle weakness (generalized): Secondary | ICD-10-CM | POA: Diagnosis not present

## 2011-08-31 DIAGNOSIS — M25519 Pain in unspecified shoulder: Secondary | ICD-10-CM | POA: Diagnosis not present

## 2011-09-02 DIAGNOSIS — M6281 Muscle weakness (generalized): Secondary | ICD-10-CM | POA: Diagnosis not present

## 2011-09-02 DIAGNOSIS — M25519 Pain in unspecified shoulder: Secondary | ICD-10-CM | POA: Diagnosis not present

## 2011-09-02 DIAGNOSIS — M25619 Stiffness of unspecified shoulder, not elsewhere classified: Secondary | ICD-10-CM | POA: Diagnosis not present

## 2011-09-07 DIAGNOSIS — M6281 Muscle weakness (generalized): Secondary | ICD-10-CM | POA: Diagnosis not present

## 2011-09-07 DIAGNOSIS — M25619 Stiffness of unspecified shoulder, not elsewhere classified: Secondary | ICD-10-CM | POA: Diagnosis not present

## 2011-09-07 DIAGNOSIS — M25519 Pain in unspecified shoulder: Secondary | ICD-10-CM | POA: Diagnosis not present

## 2011-09-09 DIAGNOSIS — I10 Essential (primary) hypertension: Secondary | ICD-10-CM | POA: Diagnosis not present

## 2011-09-09 DIAGNOSIS — M6281 Muscle weakness (generalized): Secondary | ICD-10-CM | POA: Diagnosis not present

## 2011-09-09 DIAGNOSIS — M25619 Stiffness of unspecified shoulder, not elsewhere classified: Secondary | ICD-10-CM | POA: Diagnosis not present

## 2011-09-09 DIAGNOSIS — M25519 Pain in unspecified shoulder: Secondary | ICD-10-CM | POA: Diagnosis not present

## 2011-09-09 DIAGNOSIS — Z1231 Encounter for screening mammogram for malignant neoplasm of breast: Secondary | ICD-10-CM | POA: Diagnosis not present

## 2011-09-11 ENCOUNTER — Encounter: Payer: Self-pay | Admitting: Obstetrics and Gynecology

## 2011-09-15 DIAGNOSIS — M25519 Pain in unspecified shoulder: Secondary | ICD-10-CM | POA: Diagnosis not present

## 2011-09-15 DIAGNOSIS — M25619 Stiffness of unspecified shoulder, not elsewhere classified: Secondary | ICD-10-CM | POA: Diagnosis not present

## 2011-09-15 DIAGNOSIS — M6281 Muscle weakness (generalized): Secondary | ICD-10-CM | POA: Diagnosis not present

## 2011-09-16 DIAGNOSIS — Z23 Encounter for immunization: Secondary | ICD-10-CM | POA: Diagnosis not present

## 2011-09-16 DIAGNOSIS — G250 Essential tremor: Secondary | ICD-10-CM | POA: Diagnosis not present

## 2011-09-16 DIAGNOSIS — I10 Essential (primary) hypertension: Secondary | ICD-10-CM | POA: Diagnosis not present

## 2011-09-16 DIAGNOSIS — G252 Other specified forms of tremor: Secondary | ICD-10-CM | POA: Diagnosis not present

## 2011-09-16 DIAGNOSIS — E039 Hypothyroidism, unspecified: Secondary | ICD-10-CM | POA: Diagnosis not present

## 2011-09-16 DIAGNOSIS — E559 Vitamin D deficiency, unspecified: Secondary | ICD-10-CM | POA: Diagnosis not present

## 2011-09-21 ENCOUNTER — Encounter: Payer: Self-pay | Admitting: Gynecology

## 2011-09-21 DIAGNOSIS — N840 Polyp of corpus uteri: Secondary | ICD-10-CM | POA: Insufficient documentation

## 2011-09-21 DIAGNOSIS — G459 Transient cerebral ischemic attack, unspecified: Secondary | ICD-10-CM | POA: Insufficient documentation

## 2011-09-22 DIAGNOSIS — M6281 Muscle weakness (generalized): Secondary | ICD-10-CM | POA: Diagnosis not present

## 2011-09-22 DIAGNOSIS — M25519 Pain in unspecified shoulder: Secondary | ICD-10-CM | POA: Diagnosis not present

## 2011-09-22 DIAGNOSIS — M25619 Stiffness of unspecified shoulder, not elsewhere classified: Secondary | ICD-10-CM | POA: Diagnosis not present

## 2011-09-23 ENCOUNTER — Encounter: Payer: Self-pay | Admitting: Obstetrics and Gynecology

## 2011-09-23 ENCOUNTER — Encounter: Payer: Medicare Other | Admitting: Obstetrics and Gynecology

## 2011-09-23 ENCOUNTER — Ambulatory Visit (INDEPENDENT_AMBULATORY_CARE_PROVIDER_SITE_OTHER): Payer: Medicare Other | Admitting: Obstetrics and Gynecology

## 2011-09-23 VITALS — BP 124/78 | Ht 60.0 in | Wt 138.0 lb

## 2011-09-23 DIAGNOSIS — D259 Leiomyoma of uterus, unspecified: Secondary | ICD-10-CM

## 2011-09-23 DIAGNOSIS — N951 Menopausal and female climacteric states: Secondary | ICD-10-CM | POA: Diagnosis not present

## 2011-09-23 DIAGNOSIS — N952 Postmenopausal atrophic vaginitis: Secondary | ICD-10-CM | POA: Diagnosis not present

## 2011-09-23 DIAGNOSIS — E079 Disorder of thyroid, unspecified: Secondary | ICD-10-CM | POA: Insufficient documentation

## 2011-09-23 DIAGNOSIS — M8580 Other specified disorders of bone density and structure, unspecified site: Secondary | ICD-10-CM

## 2011-09-23 DIAGNOSIS — M899 Disorder of bone, unspecified: Secondary | ICD-10-CM

## 2011-09-23 DIAGNOSIS — I10 Essential (primary) hypertension: Secondary | ICD-10-CM | POA: Insufficient documentation

## 2011-09-23 DIAGNOSIS — Z780 Asymptomatic menopausal state: Secondary | ICD-10-CM

## 2011-09-23 DIAGNOSIS — D219 Benign neoplasm of connective and other soft tissue, unspecified: Secondary | ICD-10-CM

## 2011-09-23 NOTE — Progress Notes (Signed)
Patient came back to see me today for further followup. She has been having trouble with vaginal dryness and dyspareunia. It has been an issue over the past year. She's not been sexually active for a while because of her orthopedic surgery but she is ready to reinitiate intercourse. She is having no vaginal bleeding. She is having no pelvic pain. She continues to have some hot flashes. She however seems to be okay without systemic estrogen and she does have a history of TIA. She has osteopenia without an elevated FRAX risk. Her last bone density was last year. She did have some additional bone loss. She is taking calcium and vitamin D. After correction her vitamin D level is now normal. She has had no fractures. She is up-to-date on mammograms. She has a history of fibroids that we have observed. At the moment she is asymptomatic. She has had recurrent postmenopausal bleeding with endometrial polyps. However as noted above she has not had any bleeding in the past year.  ROS: 12 system review done. Pertinent positives above. Other positives are  hypertension and hypothyroidism.  Physical examination: Kennon Portela present. HEENT within normal limits. Neck: Thyroid not large. No masses. Supraclavicular nodes: not enlarged. Breasts: Examined in both sitting and lying  position. No skin changes and no masses. Abdomen: Soft no guarding rebound or masses or hernia. Pelvic: External: Within normal limits. BUS: Within normal limits. Vaginal:within normal limits. Poor estrogen effect. No evidence of cystocele rectocele or enterocele. Cervix: clean. Uterus: Top normal size and shape. Adnexa: No masses. Rectovaginal exam: Confirmatory and negative. Extremities: Within normal limits.  Assessment: #1. Atrophic vaginitis #2. Her flashes #3. Vitamin D deficiency #4. A symptomatic fibroids #5. Osteopenia  Plan: Continue yearly mammograms. Continue periodic bone densities. Started patient on Vagifem 10 MCG 1 vaginally  twice a week. Continue vitamin d.

## 2011-09-28 ENCOUNTER — Encounter: Payer: Self-pay | Admitting: Obstetrics and Gynecology

## 2011-09-29 DIAGNOSIS — M6281 Muscle weakness (generalized): Secondary | ICD-10-CM | POA: Diagnosis not present

## 2011-09-29 DIAGNOSIS — M25519 Pain in unspecified shoulder: Secondary | ICD-10-CM | POA: Diagnosis not present

## 2011-10-07 DIAGNOSIS — I10 Essential (primary) hypertension: Secondary | ICD-10-CM | POA: Diagnosis not present

## 2011-10-07 DIAGNOSIS — G252 Other specified forms of tremor: Secondary | ICD-10-CM | POA: Diagnosis not present

## 2011-10-07 DIAGNOSIS — M6281 Muscle weakness (generalized): Secondary | ICD-10-CM | POA: Diagnosis not present

## 2011-10-07 DIAGNOSIS — G250 Essential tremor: Secondary | ICD-10-CM | POA: Diagnosis not present

## 2011-10-07 DIAGNOSIS — M25619 Stiffness of unspecified shoulder, not elsewhere classified: Secondary | ICD-10-CM | POA: Diagnosis not present

## 2011-10-07 DIAGNOSIS — E039 Hypothyroidism, unspecified: Secondary | ICD-10-CM | POA: Diagnosis not present

## 2011-10-07 DIAGNOSIS — M25519 Pain in unspecified shoulder: Secondary | ICD-10-CM | POA: Diagnosis not present

## 2011-10-12 DIAGNOSIS — M24119 Other articular cartilage disorders, unspecified shoulder: Secondary | ICD-10-CM | POA: Diagnosis not present

## 2011-10-12 DIAGNOSIS — M719 Bursopathy, unspecified: Secondary | ICD-10-CM | POA: Diagnosis not present

## 2011-10-12 DIAGNOSIS — M25519 Pain in unspecified shoulder: Secondary | ICD-10-CM | POA: Diagnosis not present

## 2011-10-12 DIAGNOSIS — M67919 Unspecified disorder of synovium and tendon, unspecified shoulder: Secondary | ICD-10-CM | POA: Diagnosis not present

## 2011-10-12 DIAGNOSIS — M75120 Complete rotator cuff tear or rupture of unspecified shoulder, not specified as traumatic: Secondary | ICD-10-CM | POA: Diagnosis not present

## 2011-11-03 DIAGNOSIS — E039 Hypothyroidism, unspecified: Secondary | ICD-10-CM | POA: Diagnosis not present

## 2011-11-09 DIAGNOSIS — M25519 Pain in unspecified shoulder: Secondary | ICD-10-CM | POA: Diagnosis not present

## 2011-11-09 DIAGNOSIS — M24119 Other articular cartilage disorders, unspecified shoulder: Secondary | ICD-10-CM | POA: Diagnosis not present

## 2011-11-09 DIAGNOSIS — M719 Bursopathy, unspecified: Secondary | ICD-10-CM | POA: Diagnosis not present

## 2011-11-09 DIAGNOSIS — M67919 Unspecified disorder of synovium and tendon, unspecified shoulder: Secondary | ICD-10-CM | POA: Diagnosis not present

## 2011-11-09 DIAGNOSIS — M75120 Complete rotator cuff tear or rupture of unspecified shoulder, not specified as traumatic: Secondary | ICD-10-CM | POA: Diagnosis not present

## 2011-12-09 ENCOUNTER — Other Ambulatory Visit: Payer: Self-pay

## 2011-12-09 DIAGNOSIS — D692 Other nonthrombocytopenic purpura: Secondary | ICD-10-CM | POA: Diagnosis not present

## 2011-12-09 DIAGNOSIS — C44621 Squamous cell carcinoma of skin of unspecified upper limb, including shoulder: Secondary | ICD-10-CM | POA: Diagnosis not present

## 2011-12-09 DIAGNOSIS — L821 Other seborrheic keratosis: Secondary | ICD-10-CM | POA: Diagnosis not present

## 2011-12-09 DIAGNOSIS — D485 Neoplasm of uncertain behavior of skin: Secondary | ICD-10-CM | POA: Diagnosis not present

## 2011-12-09 DIAGNOSIS — L570 Actinic keratosis: Secondary | ICD-10-CM | POA: Diagnosis not present

## 2011-12-17 DIAGNOSIS — M25519 Pain in unspecified shoulder: Secondary | ICD-10-CM | POA: Diagnosis not present

## 2011-12-29 ENCOUNTER — Other Ambulatory Visit: Payer: Self-pay | Admitting: Dermatology

## 2011-12-29 DIAGNOSIS — C44621 Squamous cell carcinoma of skin of unspecified upper limb, including shoulder: Secondary | ICD-10-CM | POA: Diagnosis not present

## 2012-01-01 DIAGNOSIS — E039 Hypothyroidism, unspecified: Secondary | ICD-10-CM | POA: Diagnosis not present

## 2012-01-01 DIAGNOSIS — R259 Unspecified abnormal involuntary movements: Secondary | ICD-10-CM | POA: Diagnosis not present

## 2012-01-01 DIAGNOSIS — E559 Vitamin D deficiency, unspecified: Secondary | ICD-10-CM | POA: Diagnosis not present

## 2012-01-01 DIAGNOSIS — I10 Essential (primary) hypertension: Secondary | ICD-10-CM | POA: Diagnosis not present

## 2012-01-22 DIAGNOSIS — R259 Unspecified abnormal involuntary movements: Secondary | ICD-10-CM | POA: Diagnosis not present

## 2012-01-29 DIAGNOSIS — R079 Chest pain, unspecified: Secondary | ICD-10-CM | POA: Diagnosis not present

## 2012-01-29 DIAGNOSIS — M62838 Other muscle spasm: Secondary | ICD-10-CM | POA: Diagnosis not present

## 2012-02-24 DIAGNOSIS — Z23 Encounter for immunization: Secondary | ICD-10-CM | POA: Diagnosis not present

## 2012-04-27 DIAGNOSIS — E559 Vitamin D deficiency, unspecified: Secondary | ICD-10-CM | POA: Diagnosis not present

## 2012-04-27 DIAGNOSIS — I10 Essential (primary) hypertension: Secondary | ICD-10-CM | POA: Diagnosis not present

## 2012-04-27 DIAGNOSIS — M899 Disorder of bone, unspecified: Secondary | ICD-10-CM | POA: Diagnosis not present

## 2012-04-27 DIAGNOSIS — E039 Hypothyroidism, unspecified: Secondary | ICD-10-CM | POA: Diagnosis not present

## 2012-04-27 DIAGNOSIS — M810 Age-related osteoporosis without current pathological fracture: Secondary | ICD-10-CM | POA: Diagnosis not present

## 2012-04-28 DIAGNOSIS — I10 Essential (primary) hypertension: Secondary | ICD-10-CM | POA: Diagnosis not present

## 2012-04-28 DIAGNOSIS — E559 Vitamin D deficiency, unspecified: Secondary | ICD-10-CM | POA: Diagnosis not present

## 2012-04-28 DIAGNOSIS — E039 Hypothyroidism, unspecified: Secondary | ICD-10-CM | POA: Diagnosis not present

## 2012-06-08 DIAGNOSIS — L821 Other seborrheic keratosis: Secondary | ICD-10-CM | POA: Diagnosis not present

## 2012-06-08 DIAGNOSIS — L819 Disorder of pigmentation, unspecified: Secondary | ICD-10-CM | POA: Diagnosis not present

## 2012-06-08 DIAGNOSIS — L820 Inflamed seborrheic keratosis: Secondary | ICD-10-CM | POA: Diagnosis not present

## 2012-06-08 DIAGNOSIS — L570 Actinic keratosis: Secondary | ICD-10-CM | POA: Diagnosis not present

## 2012-06-08 DIAGNOSIS — Z85828 Personal history of other malignant neoplasm of skin: Secondary | ICD-10-CM | POA: Diagnosis not present

## 2012-08-12 DIAGNOSIS — M5030 Other cervical disc degeneration, unspecified cervical region: Secondary | ICD-10-CM | POA: Diagnosis not present

## 2012-08-12 DIAGNOSIS — M25519 Pain in unspecified shoulder: Secondary | ICD-10-CM | POA: Diagnosis not present

## 2012-09-13 ENCOUNTER — Other Ambulatory Visit: Payer: Self-pay

## 2012-09-13 DIAGNOSIS — B079 Viral wart, unspecified: Secondary | ICD-10-CM | POA: Diagnosis not present

## 2012-09-13 DIAGNOSIS — D485 Neoplasm of uncertain behavior of skin: Secondary | ICD-10-CM | POA: Diagnosis not present

## 2012-09-13 DIAGNOSIS — Z85828 Personal history of other malignant neoplasm of skin: Secondary | ICD-10-CM | POA: Diagnosis not present

## 2012-09-13 DIAGNOSIS — L723 Sebaceous cyst: Secondary | ICD-10-CM | POA: Diagnosis not present

## 2012-09-13 DIAGNOSIS — D2330 Other benign neoplasm of skin of unspecified part of face: Secondary | ICD-10-CM | POA: Diagnosis not present

## 2012-09-13 DIAGNOSIS — D0470 Carcinoma in situ of skin of unspecified lower limb, including hip: Secondary | ICD-10-CM | POA: Diagnosis not present

## 2012-09-13 DIAGNOSIS — L821 Other seborrheic keratosis: Secondary | ICD-10-CM | POA: Diagnosis not present

## 2012-09-21 DIAGNOSIS — M5000 Cervical disc disorder with myelopathy, unspecified cervical region: Secondary | ICD-10-CM | POA: Diagnosis not present

## 2012-09-22 DIAGNOSIS — Z85828 Personal history of other malignant neoplasm of skin: Secondary | ICD-10-CM | POA: Diagnosis not present

## 2012-09-22 DIAGNOSIS — D0470 Carcinoma in situ of skin of unspecified lower limb, including hip: Secondary | ICD-10-CM | POA: Diagnosis not present

## 2012-09-23 ENCOUNTER — Other Ambulatory Visit: Payer: Self-pay | Admitting: Neurological Surgery

## 2012-09-23 DIAGNOSIS — M4712 Other spondylosis with myelopathy, cervical region: Secondary | ICD-10-CM

## 2012-09-29 DIAGNOSIS — Z1231 Encounter for screening mammogram for malignant neoplasm of breast: Secondary | ICD-10-CM | POA: Diagnosis not present

## 2012-10-01 ENCOUNTER — Ambulatory Visit
Admission: RE | Admit: 2012-10-01 | Discharge: 2012-10-01 | Disposition: A | Discharge status: Home / Self Care | Payer: Medicare Other | Source: Ambulatory Visit | Attending: Neurological Surgery | Admitting: Neurological Surgery

## 2012-10-01 DIAGNOSIS — M4802 Spinal stenosis, cervical region: Secondary | ICD-10-CM | POA: Diagnosis not present

## 2012-10-01 DIAGNOSIS — M4712 Other spondylosis with myelopathy, cervical region: Secondary | ICD-10-CM

## 2012-10-01 DIAGNOSIS — M5030 Other cervical disc degeneration, unspecified cervical region: Secondary | ICD-10-CM | POA: Diagnosis not present

## 2012-10-01 DIAGNOSIS — M47812 Spondylosis without myelopathy or radiculopathy, cervical region: Secondary | ICD-10-CM | POA: Diagnosis not present

## 2012-10-06 DIAGNOSIS — M47812 Spondylosis without myelopathy or radiculopathy, cervical region: Secondary | ICD-10-CM | POA: Diagnosis not present

## 2012-10-07 ENCOUNTER — Other Ambulatory Visit: Payer: Self-pay | Admitting: Neurological Surgery

## 2012-10-07 ENCOUNTER — Encounter (HOSPITAL_COMMUNITY): Payer: Self-pay | Admitting: Pharmacy Technician

## 2012-10-12 ENCOUNTER — Ambulatory Visit (HOSPITAL_COMMUNITY)
Admission: RE | Admit: 2012-10-12 | Discharge: 2012-10-12 | Disposition: A | Discharge status: Home / Self Care | Payer: Medicare Other | Source: Ambulatory Visit | Attending: Anesthesiology | Admitting: Anesthesiology

## 2012-10-12 ENCOUNTER — Encounter (HOSPITAL_COMMUNITY)
Admission: RE | Admit: 2012-10-12 | Discharge: 2012-10-12 | Disposition: A | Discharge status: Home / Self Care | Payer: Medicare Other | Source: Ambulatory Visit | Attending: Neurological Surgery | Admitting: Neurological Surgery

## 2012-10-12 ENCOUNTER — Encounter (HOSPITAL_COMMUNITY): Payer: Self-pay

## 2012-10-12 DIAGNOSIS — Z981 Arthrodesis status: Secondary | ICD-10-CM | POA: Insufficient documentation

## 2012-10-12 DIAGNOSIS — Z01818 Encounter for other preprocedural examination: Secondary | ICD-10-CM | POA: Insufficient documentation

## 2012-10-12 DIAGNOSIS — M47814 Spondylosis without myelopathy or radiculopathy, thoracic region: Secondary | ICD-10-CM | POA: Diagnosis not present

## 2012-10-12 DIAGNOSIS — Z01810 Encounter for preprocedural cardiovascular examination: Secondary | ICD-10-CM | POA: Insufficient documentation

## 2012-10-12 DIAGNOSIS — R9431 Abnormal electrocardiogram [ECG] [EKG]: Secondary | ICD-10-CM | POA: Insufficient documentation

## 2012-10-12 DIAGNOSIS — Z01812 Encounter for preprocedural laboratory examination: Secondary | ICD-10-CM | POA: Insufficient documentation

## 2012-10-12 HISTORY — DX: Cerebral infarction, unspecified: I63.9

## 2012-10-12 HISTORY — DX: Pneumonia, unspecified organism: J18.9

## 2012-10-12 HISTORY — DX: Nausea with vomiting, unspecified: R11.2

## 2012-10-12 HISTORY — DX: Unspecified osteoarthritis, unspecified site: M19.90

## 2012-10-12 HISTORY — DX: Other specified postprocedural states: Z98.890

## 2012-10-12 HISTORY — DX: Hypothyroidism, unspecified: E03.9

## 2012-10-12 LAB — CBC
Hemoglobin: 13 g/dL (ref 12.0–15.0)
MCH: 30 pg (ref 26.0–34.0)
Platelets: 273 10*3/uL (ref 150–400)
RBC: 4.33 MIL/uL (ref 3.87–5.11)
WBC: 8.1 10*3/uL (ref 4.0–10.5)

## 2012-10-12 LAB — TYPE AND SCREEN: ABO/RH(D): A NEG

## 2012-10-12 LAB — ABO/RH: ABO/RH(D): A NEG

## 2012-10-12 LAB — BASIC METABOLIC PANEL
CO2: 30 mEq/L (ref 19–32)
Calcium: 9.6 mg/dL (ref 8.4–10.5)
GFR calc non Af Amer: 75 mL/min — ABNORMAL LOW (ref >90)
Glucose, Bld: 75 mg/dL (ref 70–99)
Potassium: 4.1 mEq/L (ref 3.5–5.1)
Sodium: 141 mEq/L (ref 135–145)

## 2012-10-12 LAB — SURGICAL PCR SCREEN: Staphylococcus aureus: NEGATIVE

## 2012-10-12 NOTE — Pre-Procedure Instructions (Signed)
Jeanette [name]  10/12/2012   Your procedure is scheduled on:  10/14/12  Report to [facility] at 530 AM.  Call this number if you have problems the morning of surgery: [phone]   Remember:   Do not eat food or drink liquids after midnight.   Take these medicines the morning of surgery with A SIP OF WATER:levothyroxine, propanerol, effexor, tylenol   Do not wear jewelry, make-up or nail polish.  Do not wear lotions, powders, or perfumes. You may wear deodorant.  Do not shave 48 hours prior to surgery. Men may shave face and neck.  Do not bring valuables to the hospital.  [facility] is not responsible                   for any belongings or valuables.  Contacts, dentures or bridgework may not be worn into surgery.  Leave suitcase in the car. After surgery it may be brought to your room.  For patients admitted to the hospital, checkout time is 11:00 AM the day of  discharge.   Patients discharged the day of surgery will not be allowed to drive  home.  Name and phone number of your driver:  Special Instructions: Shower using CHG 2 nights before surgery and the night before surgery.  If you shower the day of surgery use CHG.  Use special wash - you have one bottle of CHG for all showers.  You should use approximately 1/3 of the bottle for each shower.   Please read over the following fact sheets that you were given: Pain Booklet, Coughing and Deep Breathing and Blood Transfusion Information

## 2012-10-13 MED ORDER — CEFAZOLIN SODIUM-DEXTROSE 2-3 GM-% IV SOLR
2.0000 g | INTRAVENOUS | Status: AC
  Administered 2012-10-14: 2 g via INTRAVENOUS
  Filled 2012-10-13: qty 50

## 2012-10-14 ENCOUNTER — Ambulatory Visit (HOSPITAL_COMMUNITY): Payer: Medicare Other | Admitting: Anesthesiology

## 2012-10-14 ENCOUNTER — Encounter (HOSPITAL_COMMUNITY): Payer: Self-pay | Admitting: *Deleted

## 2012-10-14 ENCOUNTER — Encounter (HOSPITAL_COMMUNITY): Payer: Self-pay | Admitting: Anesthesiology

## 2012-10-14 ENCOUNTER — Inpatient Hospital Stay (HOSPITAL_COMMUNITY)
Admission: RE | Admit: 2012-10-14 | Discharge: 2012-10-14 | DRG: 473 | Disposition: A | Discharge status: Home / Self Care | Payer: Medicare Other | Source: Ambulatory Visit | Attending: Neurological Surgery | Admitting: Neurological Surgery

## 2012-10-14 ENCOUNTER — Encounter (HOSPITAL_COMMUNITY)
Admission: RE | Disposition: A | Discharge status: Home / Self Care | Payer: Self-pay | Source: Ambulatory Visit | Attending: Neurological Surgery

## 2012-10-14 ENCOUNTER — Ambulatory Visit (HOSPITAL_COMMUNITY): Payer: Medicare Other

## 2012-10-14 DIAGNOSIS — Z8041 Family history of malignant neoplasm of ovary: Secondary | ICD-10-CM

## 2012-10-14 DIAGNOSIS — F329 Major depressive disorder, single episode, unspecified: Secondary | ICD-10-CM | POA: Diagnosis present

## 2012-10-14 DIAGNOSIS — Z981 Arthrodesis status: Secondary | ICD-10-CM

## 2012-10-14 DIAGNOSIS — M47812 Spondylosis without myelopathy or radiculopathy, cervical region: Secondary | ICD-10-CM | POA: Diagnosis not present

## 2012-10-14 DIAGNOSIS — N840 Polyp of corpus uteri: Secondary | ICD-10-CM | POA: Diagnosis not present

## 2012-10-14 DIAGNOSIS — M898X9 Other specified disorders of bone, unspecified site: Secondary | ICD-10-CM | POA: Diagnosis not present

## 2012-10-14 DIAGNOSIS — Z8673 Personal history of transient ischemic attack (TIA), and cerebral infarction without residual deficits: Secondary | ICD-10-CM

## 2012-10-14 DIAGNOSIS — M4712 Other spondylosis with myelopathy, cervical region: Principal | ICD-10-CM | POA: Diagnosis present

## 2012-10-14 DIAGNOSIS — Z79899 Other long term (current) drug therapy: Secondary | ICD-10-CM | POA: Diagnosis not present

## 2012-10-14 DIAGNOSIS — Z7982 Long term (current) use of aspirin: Secondary | ICD-10-CM

## 2012-10-14 DIAGNOSIS — M5382 Other specified dorsopathies, cervical region: Secondary | ICD-10-CM | POA: Diagnosis not present

## 2012-10-14 DIAGNOSIS — Z8249 Family history of ischemic heart disease and other diseases of the circulatory system: Secondary | ICD-10-CM | POA: Diagnosis not present

## 2012-10-14 DIAGNOSIS — I10 Essential (primary) hypertension: Secondary | ICD-10-CM | POA: Diagnosis present

## 2012-10-14 DIAGNOSIS — F3289 Other specified depressive episodes: Secondary | ICD-10-CM | POA: Diagnosis not present

## 2012-10-14 DIAGNOSIS — Z806 Family history of leukemia: Secondary | ICD-10-CM

## 2012-10-14 DIAGNOSIS — E039 Hypothyroidism, unspecified: Secondary | ICD-10-CM | POA: Diagnosis present

## 2012-10-14 HISTORY — PX: ANTERIOR CERVICAL DECOMP/DISCECTOMY FUSION: SHX1161

## 2012-10-14 SURGERY — ANTERIOR CERVICAL DECOMPRESSION/DISCECTOMY FUSION 1 LEVEL/HARDWARE REMOVAL
Anesthesia: General | Site: Neck | Wound class: Clean

## 2012-10-14 MED ORDER — DEXAMETHASONE SODIUM PHOSPHATE 4 MG/ML IJ SOLN
INTRAMUSCULAR | Status: DC | PRN
  Administered 2012-10-14: 10 mg via INTRAVENOUS

## 2012-10-14 MED ORDER — MORPHINE SULFATE 2 MG/ML IJ SOLN
1.0000 mg | INTRAMUSCULAR | Status: DC | PRN
Start: 2012-10-14 — End: 2012-10-14

## 2012-10-14 MED ORDER — ONDANSETRON HCL 4 MG/2ML IJ SOLN: 4.0000 mg | INTRAMUSCULAR | Status: DC | PRN

## 2012-10-14 MED ORDER — HEMOSTATIC AGENTS (NO CHARGE) OPTIME
TOPICAL | Status: DC | PRN
  Administered 2012-10-14: 1 via TOPICAL

## 2012-10-14 MED ORDER — THROMBIN 5000 UNITS EX SOLR
CUTANEOUS | Status: DC | PRN
  Administered 2012-10-14 (×2): 5000 [IU] via TOPICAL

## 2012-10-14 MED ORDER — NEOSTIGMINE METHYLSULFATE 1 MG/ML IJ SOLN
INTRAMUSCULAR | Status: DC | PRN
  Administered 2012-10-14: 5 mg via INTRAVENOUS

## 2012-10-14 MED ORDER — ALBUMIN HUMAN 5 % IV SOLN
INTRAVENOUS | Status: DC | PRN
  Administered 2012-10-14: 08:00:00 via INTRAVENOUS

## 2012-10-14 MED ORDER — METOCLOPRAMIDE HCL 5 MG/ML IJ SOLN
INTRAMUSCULAR | Status: DC | PRN
  Administered 2012-10-14: 10 mg via INTRAVENOUS

## 2012-10-14 MED ORDER — ACETAMINOPHEN 650 MG RE SUPP: 650.0000 mg | RECTAL | Status: DC | PRN

## 2012-10-14 MED ORDER — PHENYLEPHRINE HCL 10 MG/ML IJ SOLN
INTRAMUSCULAR | Status: DC | PRN
  Administered 2012-10-14 (×3): 40 ug via INTRAVENOUS

## 2012-10-14 MED ORDER — ARTIFICIAL TEARS OP OINT
TOPICAL_OINTMENT | OPHTHALMIC | Status: DC | PRN
  Administered 2012-10-14: 1 via OPHTHALMIC

## 2012-10-14 MED ORDER — ONDANSETRON HCL 4 MG/2ML IJ SOLN
INTRAMUSCULAR | Status: DC | PRN
  Administered 2012-10-14 (×2): 4 mg via INTRAVENOUS

## 2012-10-14 MED ORDER — SODIUM CHLORIDE 0.9 % IR SOLN
Status: DC | PRN
  Administered 2012-10-14: 08:00:00

## 2012-10-14 MED ORDER — DIAZEPAM 5 MG PO TABS: 5.0000 mg | ORAL_TABLET | Freq: Four times a day (QID) | ORAL | Status: DC | PRN

## 2012-10-14 MED ORDER — PROPRANOLOL HCL 80 MG PO TABS: 80.0000 mg | ORAL_TABLET | Freq: Every day | ORAL | Status: DC

## 2012-10-14 MED ORDER — PROPRANOLOL HCL ER 80 MG PO CP24: 80.0000 mg | ORAL_CAPSULE | Freq: Every day | ORAL | Status: DC

## 2012-10-14 MED ORDER — VENLAFAXINE HCL ER 37.5 MG PO CP24
37.5000 mg | ORAL_CAPSULE | Freq: Every day | ORAL | Status: DC
  Filled 2012-10-14: qty 1

## 2012-10-14 MED ORDER — SODIUM CHLORIDE 0.9 % IV SOLN
INTRAVENOUS | Status: AC
  Filled 2012-10-14: qty 500

## 2012-10-14 MED ORDER — LIDOCAINE-EPINEPHRINE 1 %-1:100000 IJ SOLN
INTRAMUSCULAR | Status: DC | PRN
  Administered 2012-10-14: 7 mL

## 2012-10-14 MED ORDER — FENTANYL CITRATE 0.05 MG/ML IJ SOLN
INTRAMUSCULAR | Status: DC | PRN
  Administered 2012-10-14 (×2): 100 ug via INTRAVENOUS
  Administered 2012-10-14 (×2): 50 ug via INTRAVENOUS

## 2012-10-14 MED ORDER — LIDOCAINE HCL (CARDIAC) 20 MG/ML IV SOLN
INTRAVENOUS | Status: DC | PRN
  Administered 2012-10-14: 50 mg via INTRAVENOUS
  Administered 2012-10-14: 80 mg via INTRAVENOUS
  Administered 2012-10-14: 20 mg via INTRAVENOUS

## 2012-10-14 MED ORDER — ROCURONIUM BROMIDE 100 MG/10ML IV SOLN
INTRAVENOUS | Status: DC | PRN
  Administered 2012-10-14: 40 mg via INTRAVENOUS
  Administered 2012-10-14: 10 mg via INTRAVENOUS

## 2012-10-14 MED ORDER — ALUM & MAG HYDROXIDE-SIMETH 200-200-20 MG/5ML PO SUSP: 30.0000 mL | Freq: Four times a day (QID) | ORAL | Status: DC | PRN

## 2012-10-14 MED ORDER — EPHEDRINE SULFATE 50 MG/ML IJ SOLN
INTRAMUSCULAR | Status: DC | PRN
  Administered 2012-10-14: 5 mg via INTRAVENOUS
  Administered 2012-10-14: 10 mg via INTRAVENOUS
  Administered 2012-10-14: 15 mg via INTRAVENOUS
  Administered 2012-10-14: 5 mg via INTRAVENOUS

## 2012-10-14 MED ORDER — PROPOFOL 10 MG/ML IV BOLUS
INTRAVENOUS | Status: DC | PRN
  Administered 2012-10-14: 160 mg via INTRAVENOUS
  Administered 2012-10-14: 40 mg via INTRAVENOUS

## 2012-10-14 MED ORDER — HYDROMORPHONE HCL PF 1 MG/ML IJ SOLN: 0.2500 mg | INTRAMUSCULAR | Status: DC | PRN

## 2012-10-14 MED ORDER — SODIUM CHLORIDE 0.9 % IJ SOLN: 3.0000 mL | Freq: Two times a day (BID) | INTRAMUSCULAR | Status: DC

## 2012-10-14 MED ORDER — VENLAFAXINE HCL 37.5 MG PO TABS: 37.5000 mg | ORAL_TABLET | Freq: Every day | ORAL | Status: DC

## 2012-10-14 MED ORDER — OXYCODONE-ACETAMINOPHEN 5-325 MG PO TABS: 1.0000 | ORAL_TABLET | ORAL | Status: DC | PRN

## 2012-10-14 MED ORDER — LACTATED RINGERS IV SOLN
INTRAVENOUS | Status: DC | PRN
  Administered 2012-10-14: 07:00:00 via INTRAVENOUS

## 2012-10-14 MED ORDER — ACETAMINOPHEN 325 MG PO TABS: 650.0000 mg | ORAL_TABLET | ORAL | Status: DC | PRN

## 2012-10-14 MED ORDER — DEXTROSE 5 % IV SOLN
INTRAVENOUS | Status: DC | PRN
  Administered 2012-10-14: 08:00:00 via INTRAVENOUS

## 2012-10-14 MED ORDER — LEVOTHYROXINE SODIUM 75 MCG PO TABS
75.0000 ug | ORAL_TABLET | Freq: Every day | ORAL | Status: DC
  Filled 2012-10-14: qty 1

## 2012-10-14 MED ORDER — METHOCARBAMOL 500 MG PO TABS: 500.0000 mg | ORAL_TABLET | Freq: Four times a day (QID) | ORAL | Status: DC | PRN

## 2012-10-14 MED ORDER — SODIUM CHLORIDE 0.9 % IJ SOLN: 3.0000 mL | INTRAMUSCULAR | Status: DC | PRN

## 2012-10-14 MED ORDER — LACTATED RINGERS IV SOLN: INTRAVENOUS | Status: DC | PRN

## 2012-10-14 MED ORDER — BACITRACIN 50000 UNITS IM SOLR
INTRAMUSCULAR | Status: AC
  Filled 2012-10-14: qty 1

## 2012-10-14 MED ORDER — SCOPOLAMINE 1 MG/3DAYS TD PT72: 1.0000 | MEDICATED_PATCH | TRANSDERMAL | Status: DC

## 2012-10-14 MED ORDER — MENTHOL 3 MG MT LOZG: 1.0000 | LOZENGE | OROMUCOSAL | Status: DC | PRN

## 2012-10-14 MED ORDER — DEXTROSE 5 % IV SOLN
500.0000 mg | Freq: Four times a day (QID) | INTRAVENOUS | Status: DC | PRN
  Filled 2012-10-14: qty 5

## 2012-10-14 MED ORDER — 0.9 % SODIUM CHLORIDE (POUR BTL) OPTIME
TOPICAL | Status: DC | PRN
  Administered 2012-10-14: 1000 mL

## 2012-10-14 MED ORDER — GLYCOPYRROLATE 0.2 MG/ML IJ SOLN
INTRAMUSCULAR | Status: DC | PRN
  Administered 2012-10-14: 0.6 mg via INTRAVENOUS

## 2012-10-14 MED ORDER — PHENOL 1.4 % MT LIQD: 1.0000 | OROMUCOSAL | Status: DC | PRN

## 2012-10-14 MED ORDER — MIDAZOLAM HCL 5 MG/5ML IJ SOLN
INTRAMUSCULAR | Status: DC | PRN
  Administered 2012-10-14: 2 mg via INTRAVENOUS

## 2012-10-14 SURGICAL SUPPLY — 62 items
ADH SKN CLS APL DERMABOND .7 (GAUZE/BANDAGES/DRESSINGS) ×1
ADH SKN CLS LQ APL DERMABOND (GAUZE/BANDAGES/DRESSINGS) ×1
BAG DECANTER FOR FLEXI CONT (MISCELLANEOUS) ×2 IMPLANT
BANDAGE GAUZE ELAST BULKY 4 IN (GAUZE/BANDAGES/DRESSINGS) IMPLANT
BIT DRILL 14MM (INSTRUMENTS) IMPLANT
BIT DRILL NEURO 2X3.1 SFT TUCH (MISCELLANEOUS) ×1 IMPLANT
BUR BARREL STRAIGHT FLUTE 4.0 (BURR) ×2 IMPLANT
CANISTER SUCTION 2500CC (MISCELLANEOUS) ×2 IMPLANT
CLOTH BEACON ORANGE TIMEOUT ST (SAFETY) ×2 IMPLANT
CONT SPEC 4OZ CLIKSEAL STRL BL (MISCELLANEOUS) ×4 IMPLANT
DECANTER SPIKE VIAL GLASS SM (MISCELLANEOUS) ×2 IMPLANT
DERMABOND ADHESIVE PROPEN (GAUZE/BANDAGES/DRESSINGS) ×1
DERMABOND ADVANCED (GAUZE/BANDAGES/DRESSINGS) ×1
DERMABOND ADVANCED .7 DNX12 (GAUZE/BANDAGES/DRESSINGS) ×1 IMPLANT
DERMABOND ADVANCED .7 DNX6 (GAUZE/BANDAGES/DRESSINGS) IMPLANT
DRAIN JACKSON PRATT 1/4 1325 (MISCELLANEOUS) ×1 IMPLANT
DRAPE LAPAROTOMY 100X72 PEDS (DRAPES) ×2 IMPLANT
DRAPE MICROSCOPE LEICA (MISCELLANEOUS) IMPLANT
DRAPE POUCH INSTRU U-SHP 10X18 (DRAPES) ×2 IMPLANT
DRESSING TELFA 8X3 (GAUZE/BANDAGES/DRESSINGS) ×2 IMPLANT
DRILL 14MM (INSTRUMENTS) ×2
DRILL NEURO 2X3.1 SOFT TOUCH (MISCELLANEOUS) ×2
DRSG OPSITE 4X5.5 SM (GAUZE/BANDAGES/DRESSINGS) ×2 IMPLANT
DURAPREP 6ML APPLICATOR 50/CS (WOUND CARE) ×2 IMPLANT
ELECT REM PT RETURN 9FT ADLT (ELECTROSURGICAL) ×2
ELECTRODE REM PT RTRN 9FT ADLT (ELECTROSURGICAL) ×1 IMPLANT
GAUZE SPONGE 4X4 16PLY XRAY LF (GAUZE/BANDAGES/DRESSINGS) IMPLANT
GLOVE BIO SURGEON STRL SZ7.5 (GLOVE) IMPLANT
GLOVE BIOGEL PI IND STRL 7.5 (GLOVE) IMPLANT
GLOVE BIOGEL PI IND STRL 8.5 (GLOVE) ×1 IMPLANT
GLOVE BIOGEL PI INDICATOR 7.5 (GLOVE)
GLOVE BIOGEL PI INDICATOR 8.5 (GLOVE) ×1
GLOVE ECLIPSE 6.5 STRL STRAW (GLOVE) ×1 IMPLANT
GLOVE ECLIPSE 8.5 STRL (GLOVE) ×2 IMPLANT
GLOVE EXAM NITRILE LRG STRL (GLOVE) IMPLANT
GLOVE EXAM NITRILE MD LF STRL (GLOVE) IMPLANT
GLOVE EXAM NITRILE XL STR (GLOVE) IMPLANT
GLOVE EXAM NITRILE XS STR PU (GLOVE) IMPLANT
GOWN BRE IMP SLV AUR LG STRL (GOWN DISPOSABLE) IMPLANT
GOWN BRE IMP SLV AUR XL STRL (GOWN DISPOSABLE) ×2 IMPLANT
GOWN STRL REIN 2XL LVL4 (GOWN DISPOSABLE) ×2 IMPLANT
HEAD HALTER (SOFTGOODS) ×2 IMPLANT
KIT BASIN OR (CUSTOM PROCEDURE TRAY) ×2 IMPLANT
KIT ROOM TURNOVER OR (KITS) ×2 IMPLANT
NDL SPNL 22GX3.5 QUINCKE BK (NEEDLE) ×1 IMPLANT
NEEDLE HYPO 22GX1.5 SAFETY (NEEDLE) ×2 IMPLANT
NEEDLE SPNL 22GX3.5 QUINCKE BK (NEEDLE) ×2 IMPLANT
NS IRRIG 1000ML POUR BTL (IV SOLUTION) ×2 IMPLANT
PACK LAMINECTOMY NEURO (CUSTOM PROCEDURE TRAY) ×2 IMPLANT
PAD ARMBOARD 7.5X6 YLW CONV (MISCELLANEOUS) ×6 IMPLANT
PLATE 14MM (Plate) ×1 IMPLANT
RUBBERBAND STERILE (MISCELLANEOUS) IMPLANT
SCREW 14MM (Screw) ×4 IMPLANT
SPACER ASSEM CERV LORD 7M (Spacer) ×1 IMPLANT
SPONGE INTESTINAL PEANUT (DISPOSABLE) ×2 IMPLANT
SPONGE SURGIFOAM ABS GEL SZ50 (HEMOSTASIS) ×2 IMPLANT
SUT ETHILON 3 0 FSL (SUTURE) ×1 IMPLANT
SUT VIC AB 3-0 SH 8-18 (SUTURE) ×2 IMPLANT
SYR 20ML ECCENTRIC (SYRINGE) ×2 IMPLANT
TOWEL OR 17X24 6PK STRL BLUE (TOWEL DISPOSABLE) ×2 IMPLANT
TOWEL OR 17X26 10 PK STRL BLUE (TOWEL DISPOSABLE) ×2 IMPLANT
WATER STERILE IRR 1000ML POUR (IV SOLUTION) ×2 IMPLANT

## 2012-10-14 NOTE — H&P (Signed)
Jeanette [name] is an 67 y.o. female.   Chief Complaint: Neck shoulder and upper extremity discomfort HPI: Jeanette Przybylski Humphreyis having with pain, numbness, dysesthesias and weakness in her right shoulder and arm. Jeanette [name] was last seen by me in 12/2011. She was recovering from an anterior decompression at the level of C4-C5. She had some ongoing shoulder problems and has been seen and treated by Dr. Salvatore Marvel. She underwent shoulder surgery and rehab. However, she still feels a pulling sensation on the right side such that she notes that she cannot lay on to her left side and she has numbness, tingling, dysesthesias and more over weakness of that right hand. She had some radiographs performed in Dr. Sherene Sires office and these were brought in for review. I noted on these studies that she has a solid arthrodesis from the C4 down to C7. This is with two separate plates, one of which had been partially removed at the performance of her previous anterior decompression and arthrodesis. PHYSICAL EXAMINATION: On examination, I note that Jeanette [name] is not tender to palpation of supraclavicular fossa and she has a good range of motion of her shoulder, but she is weak to testing of her deltoid, her biceps, wrist extensor and her grip on the right side compared to the left side. Reflexes are symmetrically depressed on that right side. The vibratory sensation is intact.  Past Medical History  Diagnosis Date  . Endometrial polyp   . TIA (transient ischemic attack)   . Hypertension   . Thyroid disease   . PONV (postoperative nausea and vomiting)     has scop patch to apply  10/13/12  . Hypothyroidism   . Stroke     tia's  . Depression   . Pneumonia     hx  . Arthritis     Past Surgical History  Procedure Laterality Date  . Back surgery    . Knee surgery Right     67 yrs old  . Dilation and curettage of uterus    . Hysteroscopy    . Neck surgery      Fusion  . Rotator cuff repair Right 12     Family History  Problem Relation Age of Onset  . Ovarian cancer Mother   . Cancer Mother     Leukemia  . Leukemia Mother   . Hypertension Father   . Heart disease Father    Social History:  reports that she has never smoked. She does not have any smokeless tobacco history on file. She reports that she drinks about 1.0 ounces of alcohol per week. She reports that she does not use illicit drugs.  Allergies:  Allergies  Allergen Reactions  . Compazine (Prochlorperazine Edisylate) Swelling    Medications Prior to Admission  Medication Sig Dispense Refill  . aspirin 81 MG tablet Take 81 mg by mouth daily.      . Calcium Carbonate-Vitamin D (CALCIUM + D PO) Take 1 tablet by mouth daily.       . Cholecalciferol (VITAMIN D PO) Take 5,000 Units by mouth daily.       Marland Kitchen levothyroxine (SYNTHROID, LEVOTHROID) 75 MCG tablet Take 75 mcg by mouth daily.      . Multiple Vitamin (MULTIVITAMIN) tablet Take 1 tablet by mouth daily.      . propranolol (INDERAL) 80 MG tablet Take 80 mg by mouth daily.      Marland Kitchen venlafaxine (EFFEXOR) 37.5 MG tablet Take 37.5 mg by mouth daily.      Marland Kitchen  scopolamine (TRANSDERM-SCOP) 1.5 MG Place 1 patch onto the skin every 3 (three) days. To place 10/13/12 pm        Results for orders placed during the hospital encounter of 10/12/12 (from the past 48 hour(s))  SURGICAL PCR SCREEN     Status: None   Collection Time    10/12/12  2:24 PM      Result Value Range   MRSA, PCR NEGATIVE  NEGATIVE   Staphylococcus aureus NEGATIVE  NEGATIVE   Comment:            The Xpert SA Assay (FDA     approved for NASAL specimens     in patients over 21 years of age),     is one component of     a comprehensive surveillance     program.  Test performance has     been validated by [employer] for patients greater     than or equal to 56 year old.     It is not intended     to diagnose infection nor to     guide or monitor treatment.  BASIC METABOLIC PANEL     Status: Abnormal    Collection Time    10/12/12  2:28 PM      Result Value Range   Sodium 141  135 - 145 mEq/L   Potassium 4.1  3.5 - 5.1 mEq/L   Chloride 105  96 - 112 mEq/L   CO2 30  19 - 32 mEq/L   Glucose, Bld 75  70 - 99 mg/dL   BUN 20  6 - 23 mg/dL   Creatinine, Ser 1.61  0.50 - 1.10 mg/dL   Calcium 9.6  8.4 - 09.6 mg/dL   GFR calc non Af Amer 75 (*) >90 mL/min   GFR calc Af Amer 86 (*) >90 mL/min   Comment:            The eGFR has been calculated     using the CKD EPI equation.     This calculation has not been     validated in all clinical     situations.     eGFR's persistently     <90 mL/min signify     possible Chronic Kidney Disease.  CBC     Status: None   Collection Time    10/12/12  2:28 PM      Result Value Range   WBC 8.1  4.0 - 10.5 K/uL   RBC 4.33  3.87 - 5.11 MIL/uL   Hemoglobin 13.0  12.0 - 15.0 g/dL   HCT 04.5  40.9 - 81.1 %   MCV 91.0  78.0 - 100.0 fL   MCH 30.0  26.0 - 34.0 pg   MCHC 33.0  30.0 - 36.0 g/dL   RDW 91.4  78.2 - 95.6 %   Platelets 273  150 - 400 K/uL  TYPE AND SCREEN     Status: None   Collection Time    10/12/12  2:30 PM      Result Value Range   ABO/RH(D) A NEG     Antibody Screen NEG     Sample Expiration 10/15/2012    ABO/RH     Status: None   Collection Time    10/12/12  2:30 PM      Result Value Range   ABO/RH(D) A NEG     Dg Chest 2 View  10/12/2012   *RADIOLOGY REPORT*  Clinical Data:  Preadmission radiograph  CHEST - 2 VIEW  Comparison: 08/12/2012  Findings: Heart size is normal.  There is no pleural effusion or edema identified.  No airspace consolidation noted.  Spondylosis identified within the thoracic spine.  Postsurgical changes from cervical and lumbar spine fusion noted.  IMPRESSION:  1.  No acute cardiopulmonary abnormalities.   Original Report Authenticated By: Signa Kell, M.D.    Review of Systems  HENT: Positive for neck pain.   Respiratory: Negative.   Cardiovascular: Negative.   Gastrointestinal: Negative.    Genitourinary: Negative.   Skin: Negative.   Neurological: Positive for tingling, sensory change, focal weakness and weakness.  Endo/Heme/Allergies: Negative.   Psychiatric/Behavioral: Negative.     Blood pressure 131/85, pulse 54, temperature 98 F (36.7 C), temperature source Oral, resp. rate 18, SpO2 96.00%. Physical Exam  Constitutional: She is oriented to person, place, and time. She appears well-developed and well-nourished.  HENT:  Head: Normocephalic and atraumatic.  Eyes: Conjunctivae and EOM are normal. Pupils are equal, round, and reactive to light.  Neck:  Decreased range of motion, positive spurling  Cardiovascular: Normal rate and regular rhythm.   Respiratory: Effort normal and breath sounds normal.  GI: Bowel sounds are normal.  Musculoskeletal:  See neck   Neurological: She is alert and oriented to person, place, and time.  Mild weakness of right intrinsics  Skin: Skin is warm and dry.  Psychiatric: She has a normal mood and affect. Her behavior is normal. Judgment and thought content normal.     Assessment/Plan   DATA: MRI c spine on 10/01/2012  demonstrates that Jeanette [name] has a good decompression of her cervical spine up high, but at C7-T1 she has advanced degenerative change of the disc. 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 Left sided foramen also has some moderate stenosis.  IMPRESSION/PLAN: I demonstrated the findings to Jeanette [name] and indicated that at this point, she will need to undergo surgical decompression and stabilization at C7-T1. This would best be performed by an anterior procedure. I noted that she has had a plate fixation down to C7 and part of the plate was removed for her upper decompression arthrodesis a few years back. I would  likely remove the rest of the plate and decompress and stabilize C7-T1 with a single level anterior fusion. We will plan to expedite this at her earliest convenience. I discussed with Jeanette [name] the risks which are about the same as the other surgeries, and Jeanette [name] has had some issues of swallowing difficulties, and she was concerned whether she needed to have her esophagus stretched, but I believe at this point decompressing and stabilizing the neck should hopefully change some of those symptoms.  She is admitted for surgery today.    Friend Dorfman J 10/14/2012, 7:44 AM

## 2012-10-14 NOTE — Anesthesia Procedure Notes (Signed)
Procedure Name: Intubation Date/Time: 10/14/2012 7:52 AM Performed by: Wray Kearns A Pre-anesthesia Checklist: Patient identified, Timeout performed, Emergency Drugs available, Suction available and Patient being monitored Patient Re-evaluated:Patient Re-evaluated prior to inductionOxygen Delivery Method: Circle system utilized Preoxygenation: Pre-oxygenation with 100% oxygen Intubation Type: IV induction and Cricoid Pressure applied Ventilation: Mask ventilation without difficulty Laryngoscope Size: Mac and 4 Grade View: Grade I Tube type: Oral Tube size: 7.5 mm Number of attempts: 1 Airway Equipment and Method: Stylet and LTA kit utilized Placement Confirmation: ETT inserted through vocal cords under direct vision,  breath sounds checked- equal and bilateral and positive ETCO2 Secured at: 22 cm Tube secured with: Tape Dental Injury: Teeth and Oropharynx as per pre-operative assessment

## 2012-10-14 NOTE — Preoperative (Signed)
Beta Blockers   Reason not to administer Beta Blockers:Pt took Propranolol @0500  Hrs. on 10/14/2012

## 2012-10-14 NOTE — Transfer of Care (Signed)
Immediate Anesthesia Transfer of Care Note  Patient: Theora Gianotti Alipio  Procedure(s) Performed: Procedure(s) with comments: ANTERIOR CERVICAL DECOMPRESSION/DISCECTOMY FUSION 1 LEVEL/HARDWARE REMOVAL (N/A) - C7-T1 Anterior cervical decompression/diskectomy/fusion/removal of old synthes plate  Patient Location: PACU  Anesthesia Type:General  Level of Consciousness: oriented, sedated, patient cooperative and responds to stimulation  Airway & Oxygen Therapy: Patient Spontanous Breathing and Patient connected to nasal cannula oxygen  Post-op Assessment: Report given to PACU RN, Post -op Vital signs reviewed and stable, Patient moving all extremities and Patient moving all extremities X 4  Post vital signs: Reviewed and stable  Complications: No apparent anesthesia complications

## 2012-10-14 NOTE — Anesthesia Preprocedure Evaluation (Signed)
Anesthesia Evaluation  Patient identified by MRN, date of birth, ID band Patient awake    Reviewed: Allergy & Precautions, H&P , NPO status , Patient's Chart, lab work & pertinent test results  History of Anesthesia Complications (+) PONV  Airway Mallampati: II      Dental   Pulmonary pneumonia -,  breath sounds clear to auscultation        Cardiovascular hypertension, Rhythm:Regular Rate:Normal     Neuro/Psych Anxiety TIACVA    GI/Hepatic negative GI ROS, Neg liver ROS,   Endo/Other  Hypothyroidism   Renal/GU negative Renal ROS     Musculoskeletal   Abdominal   Peds  Hematology   Anesthesia Other Findings   Reproductive/Obstetrics                           Anesthesia Physical Anesthesia Plan  ASA: III  Anesthesia Plan: General   Post-op Pain Management:    Induction: Intravenous  Airway Management Planned: Oral ETT  Additional Equipment:   Intra-op Plan:   Post-operative Plan: Extubation in OR  Informed Consent: I have reviewed the patients History and Physical, chart, labs and discussed the procedure including the risks, benefits and alternatives for the proposed anesthesia with the patient or authorized representative who has indicated his/her understanding and acceptance.   Dental advisory given  Plan Discussed with: CRNA and Anesthesiologist  Anesthesia Plan Comments:         Anesthesia Quick Evaluation

## 2012-10-14 NOTE — Discharge Summary (Signed)
Physician Discharge Summary  Patient ID: Jeanette [name] MRN: [id number] DOB/AGE: [birth year] 68 y.o.  Admit date: 10/14/2012 Discharge date: 10/14/2012  Admission Diagnoses: Cervical spondylosis with myelopathy C7-T1  Discharge Diagnoses: Herbal spondylosis with myelopathy C7-T1 Principal Problem:   Cervical spondylosis without myelopathy   Discharged Condition: good  Hospital Course: Patient was admitted to undergo anterior cervical decompression C7-T1. Previous hardware that was placed to the level of C7 and at C6 was removed.  Consults: None  Significant Diagnostic Studies: None  Treatments: surgery: Anterior cervical decompression C7-T1 arthrodesis with structural allograft anterior plate fixation N8-G9 removal previous hardware placed at C6 and C7  Discharge Exam: Blood pressure 117/73, pulse 73, temperature 97.4 F (36.3 C), temperature source Oral, resp. rate 18, SpO2 93.00%. Incision is clean and dry motor function is intact.  Disposition: 01-Home or Self Care  Discharge Orders   Future Orders Complete By Expires     Call MD for:  redness, tenderness, or signs of infection (pain, swelling, redness, odor or green/yellow discharge around incision site)  As directed     Call MD for:  severe uncontrolled pain  As directed     Call MD for:  temperature >100.4  As directed     Diet - low sodium heart healthy  As directed     Discharge instructions  As directed     Comments:      Okay to shower. Do not apply salves or appointments to incision. No heavy lifting with the upper extremities greater than 15 pounds. May resume driving when not requiring pain medication and patient feels comfortable with doing so.    Increase activity slowly  As directed         Medication List    TAKE these medications       aspirin 81 MG tablet  Take 81 mg by mouth daily.     CALCIUM + D PO  Take 1 tablet by mouth daily.     diazepam 5 MG tablet  Commonly known as:  VALIUM  Take 1  tablet (5 mg total) by mouth every 6 (six) hours as needed for anxiety.     levothyroxine 75 MCG tablet  Commonly known as:  SYNTHROID, LEVOTHROID  Take 75 mcg by mouth daily.     multivitamin tablet  Take 1 tablet by mouth daily.     oxyCODONE-acetaminophen 5-325 MG per tablet  Commonly known as:  PERCOCET/ROXICET  Take 1-2 tablets by mouth every 4 (four) hours as needed.     propranolol 80 MG tablet  Commonly known as:  INDERAL  Take 80 mg by mouth daily.     scopolamine 1.5 MG  Commonly known as:  TRANSDERM-SCOP  Place 1 patch onto the skin every 3 (three) days. To place 10/13/12 pm     venlafaxine 37.5 MG tablet  Commonly known as:  EFFEXOR  Take 37.5 mg by mouth daily.     VITAMIN D PO  Take 5,000 Units by mouth daily.         SignedStefani Dama 10/14/2012, 7:39 PM

## 2012-10-14 NOTE — Progress Notes (Signed)
PT. AMBULATING IN HALL WITH STEADY GAIT, TOLERATING DIET, VOIDING WITHOUT DIFFICULTIES. V/S STABLE. JP  DRAIN D/C'D PER ORDERS. PT. VERBALIZED UNDERSTANDING OF D/C ORDERS. SPOUSE AND STAFF ASSISTING PT. TO CAR VIA W/C. NO DISTRESS NOTED. CHRIS Nakeyia Menden RN

## 2012-10-14 NOTE — Op Note (Signed)
Date of surgery:@T @ Preoperative diagnosis: Cervical spondylosis with radiculopathy C7-T1 Post operative diagnosis: Cervical spondylosis with radiculopathy C7-T1 Procedure: Anterior cervical discectomy decompression of nerve roots and spinal canal C7-T1 arthrodesis with structural allograft, Alphatec plate fixation Z6-X0 , removal of plate from R6-E4 Surgeon: Barnett Abu M.D. Asst.: Coletta Memos Indications: Patient is a 68 year old individual is had previous anterior decompression and arthrodesis from C4-C7. She has evidence of spondylitic disease now at C7-T1 and has radiculopathy on the right side. She's been advised regarding the need for surgical decompression and arthrodesis.   Procedure: The patient was brought to the operating room placed on the table in supine position. After the smooth induction of general endotracheal anesthesia neck was placed in 5 pounds of halter traction and prepped with alcohol and DuraPrep. After sterile draping and appropriate timeout procedure a transverse incision was created in the left side of the neck and carried down to the platysma. The plane between the sternocleidomastoid and strap muscles dissected bluntly until the prevertebral space was reached. The old plate was identified, and removed The dissection was then undertaken in the longus coli muscle to allow placement of a self-retaining Caspar type retractor.  The anterior longitudinal ligament was opened at C7-T1 and ventral osteophytes were removed with a Leksell rongeur and Kerrison punch. Interspace was cleared of significant quantity of the degenerated disc material in the region of the posterior longitudinal ligament was removed. Dissection was carried out using a high-speed drill and 3-0 Karlin curettes. Uncinate processes were drilled down and removed and osteophytes from the inferior margin of the body of C7 were removed with a Kerrison 2 mm gold punch. After the central canal and lateral recesses  were well decompressed hemostasis was achieved with the bipolar cautery and some small pledgets of Gelfoam soaked in thrombin that were later irrigated away.  A 7 mm manufactured cortical cancellus allograft was then prepared  and placed into the interspace.  Next the retractor was removed and a 16 mm trestle plate was placed over the vertebral bodies and secured with 14 mm variable angle screws. A final localizing radiograph identified the position of the surgical construct. Hemostasis was achieved in the soft tissues however because of some persistent ooze a Jackson-Pratt drain was placed.  then the platysma was closed with 3-0 Vicryl in an interrupted fashion and 3-0 Vicryl was used in the subcuticular tissue. Blood loss was estimated at 50 cc

## 2012-10-14 NOTE — Plan of Care (Signed)
Problem: Consults Goal: Diagnosis - Spinal Surgery Outcome: Completed/Met Date Met:  10/14/12 Cervical Spine Fusion

## 2012-10-14 NOTE — Progress Notes (Signed)
Utilization review completed.  

## 2012-10-18 ENCOUNTER — Encounter (HOSPITAL_COMMUNITY): Payer: Self-pay | Admitting: Neurological Surgery

## 2012-10-20 NOTE — Anesthesia Postprocedure Evaluation (Signed)
  Anesthesia Post-op Note  Patient: Jeanette [name]  Procedure(s) Performed: Procedure(s) with comments: ANTERIOR CERVICAL DECOMPRESSION/DISCECTOMY FUSION 1 LEVEL/HARDWARE REMOVAL (N/A) - C7-T1 Anterior cervical decompression/diskectomy/fusion/removal of old synthes plate  Patient Location: PACU  Anesthesia Type:General  Level of Consciousness: awake  Airway and Oxygen Therapy: Patient Spontanous Breathing  Post-op Pain: mild  Post-op Assessment: Post-op Vital signs reviewed  Post-op Vital Signs: Reviewed  Complications: No apparent anesthesia complications

## 2012-10-25 DIAGNOSIS — E559 Vitamin D deficiency, unspecified: Secondary | ICD-10-CM | POA: Diagnosis not present

## 2012-10-25 DIAGNOSIS — I10 Essential (primary) hypertension: Secondary | ICD-10-CM | POA: Diagnosis not present

## 2012-10-31 DIAGNOSIS — Z0000 Encounter for general adult medical examination without abnormal findings: Secondary | ICD-10-CM | POA: Diagnosis not present

## 2012-10-31 DIAGNOSIS — I10 Essential (primary) hypertension: Secondary | ICD-10-CM | POA: Diagnosis not present

## 2012-10-31 DIAGNOSIS — E039 Hypothyroidism, unspecified: Secondary | ICD-10-CM | POA: Diagnosis not present

## 2012-10-31 DIAGNOSIS — E559 Vitamin D deficiency, unspecified: Secondary | ICD-10-CM | POA: Diagnosis not present

## 2012-11-03 DIAGNOSIS — M5000 Cervical disc disorder with myelopathy, unspecified cervical region: Secondary | ICD-10-CM | POA: Diagnosis not present

## 2012-11-03 DIAGNOSIS — M542 Cervicalgia: Secondary | ICD-10-CM | POA: Diagnosis not present

## 2012-11-19 DIAGNOSIS — J309 Allergic rhinitis, unspecified: Secondary | ICD-10-CM | POA: Diagnosis not present

## 2012-11-19 DIAGNOSIS — J00 Acute nasopharyngitis [common cold]: Secondary | ICD-10-CM | POA: Diagnosis not present

## 2012-11-19 DIAGNOSIS — J029 Acute pharyngitis, unspecified: Secondary | ICD-10-CM | POA: Diagnosis not present

## 2012-11-21 DIAGNOSIS — M1710 Unilateral primary osteoarthritis, unspecified knee: Secondary | ICD-10-CM | POA: Diagnosis not present

## 2012-11-28 DIAGNOSIS — J45909 Unspecified asthma, uncomplicated: Secondary | ICD-10-CM | POA: Diagnosis not present

## 2012-11-28 DIAGNOSIS — J329 Chronic sinusitis, unspecified: Secondary | ICD-10-CM | POA: Diagnosis not present

## 2013-01-02 DIAGNOSIS — IMO0002 Reserved for concepts with insufficient information to code with codable children: Secondary | ICD-10-CM | POA: Diagnosis not present

## 2013-01-02 DIAGNOSIS — M25559 Pain in unspecified hip: Secondary | ICD-10-CM | POA: Diagnosis not present

## 2013-01-19 DIAGNOSIS — IMO0002 Reserved for concepts with insufficient information to code with codable children: Secondary | ICD-10-CM | POA: Diagnosis not present

## 2013-01-20 ENCOUNTER — Other Ambulatory Visit: Payer: Self-pay | Admitting: Neurological Surgery

## 2013-01-20 DIAGNOSIS — M5416 Radiculopathy, lumbar region: Secondary | ICD-10-CM

## 2013-01-27 ENCOUNTER — Ambulatory Visit
Admission: RE | Admit: 2013-01-27 | Discharge: 2013-01-27 | Disposition: A | Discharge status: Home / Self Care | Payer: Medicare Other | Source: Ambulatory Visit | Attending: Neurological Surgery | Admitting: Neurological Surgery

## 2013-01-27 DIAGNOSIS — M5126 Other intervertebral disc displacement, lumbar region: Secondary | ICD-10-CM | POA: Diagnosis not present

## 2013-01-27 DIAGNOSIS — M5416 Radiculopathy, lumbar region: Secondary | ICD-10-CM

## 2013-01-27 DIAGNOSIS — M48061 Spinal stenosis, lumbar region without neurogenic claudication: Secondary | ICD-10-CM | POA: Diagnosis not present

## 2013-01-27 MED ORDER — GADOBENATE DIMEGLUMINE 529 MG/ML IV SOLN
13.0000 mL | Freq: Once | INTRAVENOUS | Status: AC | PRN
  Administered 2013-01-27: 13 mL via INTRAVENOUS

## 2013-02-02 DIAGNOSIS — M48061 Spinal stenosis, lumbar region without neurogenic claudication: Secondary | ICD-10-CM | POA: Diagnosis not present

## 2013-02-03 ENCOUNTER — Other Ambulatory Visit: Payer: Self-pay | Admitting: Neurological Surgery

## 2013-02-05 DIAGNOSIS — Z23 Encounter for immunization: Secondary | ICD-10-CM | POA: Diagnosis not present

## 2013-02-22 ENCOUNTER — Encounter (HOSPITAL_COMMUNITY): Payer: Self-pay | Admitting: Pharmacy Technician

## 2013-02-23 DIAGNOSIS — M25519 Pain in unspecified shoulder: Secondary | ICD-10-CM | POA: Diagnosis not present

## 2013-02-24 ENCOUNTER — Encounter (HOSPITAL_COMMUNITY)
Admission: RE | Admit: 2013-02-24 | Discharge: 2013-02-24 | Disposition: A | Discharge status: Home / Self Care | Payer: Medicare Other | Source: Ambulatory Visit | Attending: Neurological Surgery | Admitting: Neurological Surgery

## 2013-02-24 ENCOUNTER — Encounter (HOSPITAL_COMMUNITY): Payer: Self-pay

## 2013-02-24 DIAGNOSIS — Z01812 Encounter for preprocedural laboratory examination: Secondary | ICD-10-CM | POA: Diagnosis not present

## 2013-02-24 HISTORY — DX: Unspecified asthma, uncomplicated: J45.909

## 2013-02-24 LAB — BASIC METABOLIC PANEL
BUN: 18 mg/dL (ref 6–23)
Calcium: 9.9 mg/dL (ref 8.4–10.5)
Creatinine, Ser: 0.62 mg/dL (ref 0.50–1.10)
GFR calc non Af Amer: >90 mL/min (ref >90)
Glucose, Bld: 130 mg/dL — ABNORMAL HIGH (ref 70–99)

## 2013-02-24 LAB — CBC
MCH: 30.2 pg (ref 26.0–34.0)
MCHC: 33.6 g/dL (ref 30.0–36.0)
Platelets: 303 10*3/uL (ref 150–400)
RDW: 13.1 % (ref 11.5–15.5)

## 2013-02-24 LAB — SURGICAL PCR SCREEN: MRSA, PCR: NEGATIVE

## 2013-02-24 LAB — TYPE AND SCREEN: ABO/RH(D): A NEG

## 2013-02-24 NOTE — Progress Notes (Signed)
Called office and spoke with Shanda Bumps to have dr elsner look at elevated wbc of 19.5.02/24/13 at 1534

## 2013-02-24 NOTE — Pre-Procedure Instructions (Addendum)
Leisel Pinette [place]  02/24/2013   Your procedure is scheduled on:03/06/13  Report to [facility] short stay admitting at 645 AM.  Call this number if you have problems the morning of surgery: [phone]   Remember:   Do not eat food or drink liquids after midnight.   Take these medicines the morning of surgery with A SIP OF WATER: levothroxine, inderal, effexor if needed     STOP aspirin, multi vit 03/01/13   Do not wear jewelry, make-up or nail polish.  Do not wear lotions, powders, or perfumes. You may wear deodorant.  Do not shave 48 hours prior to surgery. Men may shave face and neck.  Do not bring valuables to the hospital.  [facility] is not responsible                  for any belongings or valuables.               Contacts, dentures or bridgework may not be worn into surgery.  Leave suitcase in the car. After surgery it may be brought to your room.  For patients admitted to the hospital, discharge time is determined by your                treatment team.               Patients discharged the day of surgery will not be allowed to drive  home.  Name and phone number of your driver  Special Instructions: Shower using CHG 2 nights before surgery and the night before surgery.  If you shower the day of surgery use CHG.  Use special wash - you have one bottle of CHG for all showers.  You should use approximately 1/3 of the bottle for each shower.   Please read over the following fact sheets that you were given: Pain Booklet, Coughing and Deep Breathing, Blood Transfusion Information, MRSA Information and Surgical Site Infection Prevention

## 2013-02-27 ENCOUNTER — Encounter (HOSPITAL_COMMUNITY): Payer: Self-pay

## 2013-02-27 NOTE — Progress Notes (Addendum)
Anesthesia Chart Review:  Patient is a 67 year old female scheduled for L2-3 PLIF on 03/06/13 by Dr. Danielle Dess.  History includes non-smoker, post-operative N/V, hypothyroidism, HTN, asthma, TIA, depression, tonsillectomy, prior back surgery, C7-T1 ACDF with removal of plate from Z6-1 10/2012.  EKG on 10/12/12 showed NSR, possible LAE, non-specific T wave abnormality.  CXR on 10/12/12 showed no acute cardiopulmonary abnormalities.  Preoperative labs noted.  WBC 19.5K.  Currently, there are no steroids listed on her medication list.  [name] at Dr. Verlee Rossetti office is aware.  She reviewed with Dr. Danielle Dess.  For now, he would like her CBC repeated on the day of surgery, and will make additional recommendations as felt appropriate following results.  Her surgeon and anesthesia staff will have to follow-up results on the day of surgery.   [name] [facility] Stay Center/Anesthesiology Phone [phone] 02/27/2013 1:48 PM

## 2013-03-05 MED ORDER — CEFAZOLIN SODIUM-DEXTROSE 2-3 GM-% IV SOLR
2.0000 g | INTRAVENOUS | Status: AC
  Administered 2013-03-06 (×2): 2 g via INTRAVENOUS

## 2013-03-05 NOTE — Anesthesia Preprocedure Evaluation (Signed)
Anesthesia Evaluation  Patient identified by MRN, date of birth, ID band Patient awake    Reviewed: Allergy & Precautions, H&P , NPO status , Patient's Chart, lab work & pertinent test results  History of Anesthesia Complications (+) PONV  Airway Mallampati: II TM Distance: <3 FB Neck ROM: Limited    Dental   Pulmonary asthma , pneumonia -, resolved,  breath sounds clear to auscultation        Cardiovascular hypertension, Rhythm:Regular Rate:Normal     Neuro/Psych Anxiety Depression TIACVA    GI/Hepatic negative GI ROS, Neg liver ROS,   Endo/Other  Hypothyroidism   Renal/GU negative Renal ROS     Musculoskeletal   Abdominal   Peds  Hematology   Anesthesia Other Findings   Reproductive/Obstetrics                           Anesthesia Physical  Anesthesia Plan  ASA: III  Anesthesia Plan: General   Post-op Pain Management:    Induction: Intravenous  Airway Management Planned: Oral ETT and Video Laryngoscope Planned  Additional Equipment:   Intra-op Plan:   Post-operative Plan: Extubation in OR  Informed Consent: I have reviewed the patients History and Physical, chart, labs and discussed the procedure including the risks, benefits and alternatives for the proposed anesthesia with the patient or authorized representative who has indicated his/her understanding and acceptance.   Dental advisory given  Plan Discussed with: CRNA  Anesthesia Plan Comments:         Anesthesia Quick Evaluation

## 2013-03-06 ENCOUNTER — Encounter (HOSPITAL_COMMUNITY)
Admission: RE | Disposition: A | Discharge status: Home / Self Care | Payer: Medicare Other | Source: Ambulatory Visit | Attending: Neurological Surgery

## 2013-03-06 ENCOUNTER — Encounter (HOSPITAL_COMMUNITY): Payer: Self-pay | Admitting: *Deleted

## 2013-03-06 ENCOUNTER — Encounter (HOSPITAL_COMMUNITY): Payer: Medicare Other | Admitting: Vascular Surgery

## 2013-03-06 ENCOUNTER — Inpatient Hospital Stay (HOSPITAL_COMMUNITY): Payer: Medicare Other | Admitting: Anesthesiology

## 2013-03-06 ENCOUNTER — Inpatient Hospital Stay (HOSPITAL_COMMUNITY): Payer: Medicare Other

## 2013-03-06 ENCOUNTER — Inpatient Hospital Stay (HOSPITAL_COMMUNITY)
Admission: RE | Admit: 2013-03-06 | Discharge: 2013-03-11 | DRG: 460 | Disposition: A | Discharge status: Home / Self Care | Payer: Medicare Other | Source: Ambulatory Visit | Attending: Neurological Surgery | Admitting: Neurological Surgery

## 2013-03-06 DIAGNOSIS — D62 Acute posthemorrhagic anemia: Secondary | ICD-10-CM | POA: Diagnosis not present

## 2013-03-06 DIAGNOSIS — K560 Paralytic ileus: Secondary | ICD-10-CM | POA: Diagnosis not present

## 2013-03-06 DIAGNOSIS — Z8673 Personal history of transient ischemic attack (TIA), and cerebral infarction without residual deficits: Secondary | ICD-10-CM | POA: Diagnosis not present

## 2013-03-06 DIAGNOSIS — I10 Essential (primary) hypertension: Secondary | ICD-10-CM | POA: Diagnosis present

## 2013-03-06 DIAGNOSIS — M47817 Spondylosis without myelopathy or radiculopathy, lumbosacral region: Secondary | ICD-10-CM | POA: Diagnosis not present

## 2013-03-06 DIAGNOSIS — IMO0002 Reserved for concepts with insufficient information to code with codable children: Secondary | ICD-10-CM | POA: Diagnosis not present

## 2013-03-06 DIAGNOSIS — M48062 Spinal stenosis, lumbar region with neurogenic claudication: Secondary | ICD-10-CM | POA: Diagnosis not present

## 2013-03-06 DIAGNOSIS — G9741 Accidental puncture or laceration of dura during a procedure: Secondary | ICD-10-CM | POA: Diagnosis not present

## 2013-03-06 DIAGNOSIS — Z981 Arthrodesis status: Secondary | ICD-10-CM

## 2013-03-06 DIAGNOSIS — M539 Dorsopathy, unspecified: Secondary | ICD-10-CM | POA: Diagnosis not present

## 2013-03-06 DIAGNOSIS — M549 Dorsalgia, unspecified: Secondary | ICD-10-CM | POA: Diagnosis not present

## 2013-03-06 DIAGNOSIS — M48061 Spinal stenosis, lumbar region without neurogenic claudication: Secondary | ICD-10-CM | POA: Diagnosis not present

## 2013-03-06 DIAGNOSIS — M129 Arthropathy, unspecified: Secondary | ICD-10-CM | POA: Diagnosis present

## 2013-03-06 DIAGNOSIS — E039 Hypothyroidism, unspecified: Secondary | ICD-10-CM | POA: Diagnosis present

## 2013-03-06 DIAGNOSIS — N840 Polyp of corpus uteri: Secondary | ICD-10-CM | POA: Diagnosis not present

## 2013-03-06 LAB — DIFFERENTIAL
Basophils Absolute: 0.1 10*3/uL (ref 0.0–0.1)
Eosinophils Absolute: 0.2 10*3/uL (ref 0.0–0.7)
Lymphocytes Relative: 29 % (ref 12–46)
Lymphs Abs: 3 10*3/uL (ref 0.7–4.0)
Monocytes Relative: 8 % (ref 3–12)
Neutro Abs: 6.5 10*3/uL (ref 1.7–7.7)
Neutrophils Relative %: 62 % (ref 43–77)

## 2013-03-06 LAB — CBC
Hemoglobin: 13.7 g/dL (ref 12.0–15.0)
Platelets: 269 10*3/uL (ref 150–400)
RBC: 4.48 MIL/uL (ref 3.87–5.11)
WBC: 10.6 10*3/uL — ABNORMAL HIGH (ref 4.0–10.5)

## 2013-03-06 SURGERY — POSTERIOR LUMBAR FUSION 1 LEVEL
Anesthesia: General | Site: Spine Lumbar | Wound class: Clean

## 2013-03-06 MED ORDER — ACETAMINOPHEN 325 MG PO TABS
650.0000 mg | ORAL_TABLET | ORAL | Status: DC | PRN
  Administered 2013-03-11: 650 mg via ORAL
  Filled 2013-03-06: qty 2

## 2013-03-06 MED ORDER — MIDAZOLAM HCL 5 MG/5ML IJ SOLN
INTRAMUSCULAR | Status: DC | PRN
  Administered 2013-03-06: 1 mg via INTRAVENOUS
  Administered 2013-03-06: 2 mg via INTRAVENOUS

## 2013-03-06 MED ORDER — SODIUM CHLORIDE 0.9 % IV SOLN
INTRAVENOUS | Status: DC | PRN
  Administered 2013-03-06: 14:00:00 via INTRAVENOUS

## 2013-03-06 MED ORDER — MIDAZOLAM HCL 2 MG/2ML IJ SOLN: 1.0000 mg | INTRAMUSCULAR | Status: DC | PRN

## 2013-03-06 MED ORDER — ACETAMINOPHEN 650 MG RE SUPP: 650.0000 mg | RECTAL | Status: DC | PRN

## 2013-03-06 MED ORDER — LEVOTHYROXINE SODIUM 75 MCG PO TABS
75.0000 ug | ORAL_TABLET | Freq: Every day | ORAL | Status: DC
  Administered 2013-03-07 – 2013-03-11 (×5): 75 ug via ORAL
  Filled 2013-03-06 (×6): qty 1

## 2013-03-06 MED ORDER — GLYCOPYRROLATE 0.2 MG/ML IJ SOLN
INTRAMUSCULAR | Status: DC | PRN
  Administered 2013-03-06: .4 mg via INTRAVENOUS
  Administered 2013-03-06: 0.2 mg via INTRAVENOUS

## 2013-03-06 MED ORDER — HYDROMORPHONE HCL PF 1 MG/ML IJ SOLN
INTRAMUSCULAR | Status: AC
  Filled 2013-03-06: qty 1

## 2013-03-06 MED ORDER — THROMBIN 5000 UNITS EX SOLR
OROMUCOSAL | Status: DC | PRN
  Administered 2013-03-06: 12:00:00 via TOPICAL

## 2013-03-06 MED ORDER — SODIUM CHLORIDE 0.9 % IV SOLN
INTRAVENOUS | Status: DC
  Administered 2013-03-07 – 2013-03-08 (×6): via INTRAVENOUS
  Administered 2013-03-08 (×2): 1000 mL via INTRAVENOUS
  Administered 2013-03-09: 06:00:00 via INTRAVENOUS

## 2013-03-06 MED ORDER — LIDOCAINE-EPINEPHRINE 1 %-1:100000 IJ SOLN
INTRAMUSCULAR | Status: DC | PRN
  Administered 2013-03-06: 4.5 mL

## 2013-03-06 MED ORDER — OXYCODONE-ACETAMINOPHEN 5-325 MG PO TABS
1.0000 | ORAL_TABLET | ORAL | Status: DC | PRN
  Administered 2013-03-07 – 2013-03-08 (×2): 2 via ORAL
  Administered 2013-03-08: 1 via ORAL
  Administered 2013-03-08: 2 via ORAL
  Filled 2013-03-06 (×3): qty 2
  Filled 2013-03-06: qty 1
  Filled 2013-03-06: qty 2

## 2013-03-06 MED ORDER — EPHEDRINE SULFATE 50 MG/ML IJ SOLN
INTRAMUSCULAR | Status: DC | PRN
  Administered 2013-03-06: 5 mg via INTRAVENOUS
  Administered 2013-03-06: 10 mg via INTRAVENOUS
  Administered 2013-03-06 (×2): 5 mg via INTRAVENOUS
  Administered 2013-03-06 (×2): 10 mg via INTRAVENOUS
  Administered 2013-03-06: 5 mg via INTRAVENOUS
  Administered 2013-03-06: 10 mg via INTRAVENOUS

## 2013-03-06 MED ORDER — KETOROLAC TROMETHAMINE 15 MG/ML IJ SOLN
15.0000 mg | Freq: Four times a day (QID) | INTRAMUSCULAR | Status: AC
  Administered 2013-03-07 (×4): 15 mg via INTRAVENOUS
  Filled 2013-03-06 (×5): qty 1

## 2013-03-06 MED ORDER — NEOSTIGMINE METHYLSULFATE 1 MG/ML IJ SOLN
INTRAMUSCULAR | Status: DC | PRN
  Administered 2013-03-06: 3 mg via INTRAVENOUS

## 2013-03-06 MED ORDER — METHOCARBAMOL 100 MG/ML IJ SOLN
500.0000 mg | Freq: Four times a day (QID) | INTRAVENOUS | Status: DC | PRN
  Filled 2013-03-06: qty 5

## 2013-03-06 MED ORDER — THROMBIN 20000 UNITS EX SOLR
CUTANEOUS | Status: DC | PRN
  Administered 2013-03-06: 11:00:00 via TOPICAL

## 2013-03-06 MED ORDER — CEFAZOLIN SODIUM 1-5 GM-% IV SOLN
1.0000 g | Freq: Three times a day (TID) | INTRAVENOUS | Status: AC
  Administered 2013-03-06 – 2013-03-07 (×2): 1 g via INTRAVENOUS
  Filled 2013-03-06 (×2): qty 50

## 2013-03-06 MED ORDER — ALBUMIN HUMAN 5 % IV SOLN
INTRAVENOUS | Status: DC | PRN
  Administered 2013-03-06 (×2): via INTRAVENOUS

## 2013-03-06 MED ORDER — METHOCARBAMOL 100 MG/ML IJ SOLN
500.0000 mg | INTRAVENOUS | Status: DC
  Filled 2013-03-06: qty 5

## 2013-03-06 MED ORDER — ONDANSETRON HCL 4 MG/2ML IJ SOLN
4.0000 mg | INTRAMUSCULAR | Status: DC | PRN
  Administered 2013-03-07 – 2013-03-09 (×5): 4 mg via INTRAVENOUS
  Filled 2013-03-06 (×6): qty 2

## 2013-03-06 MED ORDER — 0.9 % SODIUM CHLORIDE (POUR BTL) OPTIME
TOPICAL | Status: DC | PRN
  Administered 2013-03-06: 1000 mL

## 2013-03-06 MED ORDER — METHOCARBAMOL 500 MG PO TABS
500.0000 mg | ORAL_TABLET | Freq: Four times a day (QID) | ORAL | Status: DC | PRN
  Administered 2013-03-08 – 2013-03-09 (×2): 500 mg via ORAL
  Filled 2013-03-06 (×2): qty 1

## 2013-03-06 MED ORDER — CEFAZOLIN SODIUM-DEXTROSE 2-3 GM-% IV SOLR
INTRAVENOUS | Status: AC
  Filled 2013-03-06: qty 50

## 2013-03-06 MED ORDER — PHENYLEPHRINE HCL 10 MG/ML IJ SOLN
INTRAMUSCULAR | Status: DC | PRN
  Administered 2013-03-06 (×4): 40 ug via INTRAVENOUS
  Administered 2013-03-06: 80 ug via INTRAVENOUS
  Administered 2013-03-06 (×2): 40 ug via INTRAVENOUS

## 2013-03-06 MED ORDER — SODIUM CHLORIDE 0.9 % IV SOLN: 250.0000 mL | INTRAVENOUS | Status: DC

## 2013-03-06 MED ORDER — LACTATED RINGERS IV SOLN: INTRAVENOUS | Status: DC | PRN

## 2013-03-06 MED ORDER — OXYCODONE HCL 5 MG/5ML PO SOLN: 5.0000 mg | Freq: Once | ORAL | Status: DC | PRN

## 2013-03-06 MED ORDER — OXYCODONE HCL 5 MG PO TABS: 5.0000 mg | ORAL_TABLET | Freq: Once | ORAL | Status: DC | PRN

## 2013-03-06 MED ORDER — KETOROLAC TROMETHAMINE 15 MG/ML IJ SOLN: 15.0000 mg | Freq: Once | INTRAMUSCULAR | Status: DC

## 2013-03-06 MED ORDER — FENTANYL CITRATE 0.05 MG/ML IJ SOLN: 50.0000 ug | Freq: Once | INTRAMUSCULAR | Status: DC

## 2013-03-06 MED ORDER — PROPRANOLOL HCL ER 80 MG PO CP24
80.0000 mg | ORAL_CAPSULE | Freq: Every day | ORAL | Status: DC
  Administered 2013-03-10 – 2013-03-11 (×2): 80 mg via ORAL
  Filled 2013-03-06 (×5): qty 1

## 2013-03-06 MED ORDER — PHENOL 1.4 % MT LIQD: 1.0000 | OROMUCOSAL | Status: DC | PRN

## 2013-03-06 MED ORDER — LACTATED RINGERS IV SOLN
INTRAVENOUS | Status: DC
  Administered 2013-03-06 (×4): via INTRAVENOUS

## 2013-03-06 MED ORDER — ONDANSETRON HCL 4 MG/2ML IJ SOLN
INTRAMUSCULAR | Status: DC | PRN
  Administered 2013-03-06 (×2): 4 mg via INTRAVENOUS

## 2013-03-06 MED ORDER — ROCURONIUM BROMIDE 100 MG/10ML IV SOLN
INTRAVENOUS | Status: DC | PRN
  Administered 2013-03-06 (×4): 10 mg via INTRAVENOUS
  Administered 2013-03-06: 40 mg via INTRAVENOUS

## 2013-03-06 MED ORDER — HYDROMORPHONE HCL PF 1 MG/ML IJ SOLN
0.2500 mg | INTRAMUSCULAR | Status: DC | PRN
  Administered 2013-03-06 (×4): 0.5 mg via INTRAVENOUS

## 2013-03-06 MED ORDER — VENLAFAXINE HCL 37.5 MG PO TABS
37.5000 mg | ORAL_TABLET | ORAL | Status: DC
  Administered 2013-03-07 – 2013-03-11 (×3): 37.5 mg via ORAL
  Filled 2013-03-06 (×3): qty 1

## 2013-03-06 MED ORDER — MENTHOL 3 MG MT LOZG: 1.0000 | LOZENGE | OROMUCOSAL | Status: DC | PRN

## 2013-03-06 MED ORDER — HYDROMORPHONE HCL PF 1 MG/ML IJ SOLN: 0.5000 mg | INTRAMUSCULAR | Status: DC | PRN

## 2013-03-06 MED ORDER — BUPIVACAINE HCL (PF) 0.5 % IJ SOLN
INTRAMUSCULAR | Status: DC | PRN
  Administered 2013-03-06: 4.5 mL
  Administered 2013-03-06: 20 mL

## 2013-03-06 MED ORDER — ALBUTEROL SULFATE HFA 108 (90 BASE) MCG/ACT IN AERS: 2.0000 | INHALATION_SPRAY | Freq: Four times a day (QID) | RESPIRATORY_TRACT | Status: DC | PRN

## 2013-03-06 MED ORDER — LIDOCAINE HCL (CARDIAC) 20 MG/ML IV SOLN
INTRAVENOUS | Status: DC | PRN
  Administered 2013-03-06: 90 mg via INTRAVENOUS

## 2013-03-06 MED ORDER — SODIUM CHLORIDE 0.9 % IR SOLN
Status: DC | PRN
  Administered 2013-03-06: 11:00:00

## 2013-03-06 MED ORDER — FENTANYL CITRATE 0.05 MG/ML IJ SOLN
INTRAMUSCULAR | Status: DC | PRN
  Administered 2013-03-06: 100 ug via INTRAVENOUS
  Administered 2013-03-06 (×3): 50 ug via INTRAVENOUS

## 2013-03-06 MED ORDER — PROPOFOL 10 MG/ML IV BOLUS
INTRAVENOUS | Status: DC | PRN
  Administered 2013-03-06: 120 mg via INTRAVENOUS

## 2013-03-06 MED ORDER — SODIUM CHLORIDE 0.9 % IJ SOLN
3.0000 mL | Freq: Two times a day (BID) | INTRAMUSCULAR | Status: DC
  Administered 2013-03-06 – 2013-03-08 (×3): 3 mL via INTRAVENOUS

## 2013-03-06 MED ORDER — KETOROLAC TROMETHAMINE 30 MG/ML IJ SOLN
INTRAMUSCULAR | Status: AC
  Administered 2013-03-06: 15 mg
  Filled 2013-03-06: qty 1

## 2013-03-06 MED ORDER — SODIUM CHLORIDE 0.9 % IJ SOLN: 3.0000 mL | INTRAMUSCULAR | Status: DC | PRN

## 2013-03-06 SURGICAL SUPPLY — 68 items
ADH SKN CLS APL DERMABOND .7 (GAUZE/BANDAGES/DRESSINGS)
ADH SKN CLS LQ APL DERMABOND (GAUZE/BANDAGES/DRESSINGS) ×1
BAG DECANTER FOR FLEXI CONT (MISCELLANEOUS) ×2 IMPLANT
BLADE SURG ROTATE 9660 (MISCELLANEOUS) IMPLANT
BUR MATCHSTICK NEURO 3.0 LAGG (BURR) ×2 IMPLANT
CAGE TLIF 12MM (Cage) ×1 IMPLANT
CAGE TLIF 13MM SPINAL (Cage) ×1 IMPLANT
CANISTER SUCT 3000ML (MISCELLANEOUS) ×2 IMPLANT
CONT SPEC 4OZ CLIKSEAL STRL BL (MISCELLANEOUS) ×4 IMPLANT
COVER BACK TABLE 24X17X13 BIG (DRAPES) IMPLANT
COVER TABLE BACK 60X90 (DRAPES) ×2 IMPLANT
DECANTER SPIKE VIAL GLASS SM (MISCELLANEOUS) ×2 IMPLANT
DERMABOND ADHESIVE PROPEN (GAUZE/BANDAGES/DRESSINGS) ×1
DERMABOND ADVANCED (GAUZE/BANDAGES/DRESSINGS)
DERMABOND ADVANCED .7 DNX12 (GAUZE/BANDAGES/DRESSINGS) ×1 IMPLANT
DERMABOND ADVANCED .7 DNX6 (GAUZE/BANDAGES/DRESSINGS) IMPLANT
DRAPE C-ARM 42X72 X-RAY (DRAPES) ×5 IMPLANT
DRAPE LAPAROTOMY 100X72X124 (DRAPES) ×2 IMPLANT
DRAPE POUCH INSTRU U-SHP 10X18 (DRAPES) ×2 IMPLANT
DRAPE PROXIMA HALF (DRAPES) IMPLANT
DRSG OPSITE POSTOP 4X10 (GAUZE/BANDAGES/DRESSINGS) ×1 IMPLANT
DURAPREP 26ML APPLICATOR (WOUND CARE) ×2 IMPLANT
ELECT REM PT RETURN 9FT ADLT (ELECTROSURGICAL) ×2
ELECTRODE REM PT RTRN 9FT ADLT (ELECTROSURGICAL) ×1 IMPLANT
GAUZE SPONGE 4X4 16PLY XRAY LF (GAUZE/BANDAGES/DRESSINGS) IMPLANT
GLOVE BIO SURGEON STRL SZ 6.5 (GLOVE) ×1 IMPLANT
GLOVE BIOGEL PI IND STRL 6.5 (GLOVE) IMPLANT
GLOVE BIOGEL PI IND STRL 8.5 (GLOVE) ×2 IMPLANT
GLOVE BIOGEL PI INDICATOR 6.5 (GLOVE) ×1
GLOVE BIOGEL PI INDICATOR 8.5 (GLOVE) ×4
GLOVE ECLIPSE 8.5 STRL (GLOVE) ×5 IMPLANT
GLOVE EXAM NITRILE LRG STRL (GLOVE) IMPLANT
GLOVE EXAM NITRILE MD LF STRL (GLOVE) IMPLANT
GLOVE EXAM NITRILE XL STR (GLOVE) IMPLANT
GLOVE EXAM NITRILE XS STR PU (GLOVE) IMPLANT
GLOVE SURG SS PI 8.0 STRL IVOR (GLOVE) ×3 IMPLANT
GOWN BRE IMP SLV AUR LG STRL (GOWN DISPOSABLE) ×1 IMPLANT
GOWN BRE IMP SLV AUR XL STRL (GOWN DISPOSABLE) ×3 IMPLANT
GOWN STRL REIN 2XL LVL4 (GOWN DISPOSABLE) ×4 IMPLANT
HEMOSTAT POWDER KIT SURGIFOAM (HEMOSTASIS) ×1 IMPLANT
KIT BASIN OR (CUSTOM PROCEDURE TRAY) ×2 IMPLANT
KIT ROOM TURNOVER OR (KITS) ×2 IMPLANT
NEEDLE HYPO 22GX1.5 SAFETY (NEEDLE) ×2 IMPLANT
NS IRRIG 1000ML POUR BTL (IV SOLUTION) ×2 IMPLANT
PACK FOAM VITOSS 10CC (Orthopedic Implant) ×1 IMPLANT
PACK LAMINECTOMY NEURO (CUSTOM PROCEDURE TRAY) ×2 IMPLANT
PAD ARMBOARD 7.5X6 YLW CONV (MISCELLANEOUS) ×8 IMPLANT
PATTIES SURGICAL .5 X.5 (GAUZE/BANDAGES/DRESSINGS) ×1 IMPLANT
PATTIES SURGICAL .5 X1 (DISPOSABLE) ×2 IMPLANT
ROD 90MM (Rod) ×2 IMPLANT
SCREW 40MM (Screw) ×4 IMPLANT
SCREW 50MM (Screw) ×2 IMPLANT
SCREW SET SPINAL STD HEXALOBE (Screw) ×6 IMPLANT
SPONGE GAUZE 4X4 12PLY (GAUZE/BANDAGES/DRESSINGS) ×1 IMPLANT
SPONGE LAP 4X18 X RAY DECT (DISPOSABLE) IMPLANT
SPONGE SURGIFOAM ABS GEL 100 (HEMOSTASIS) ×2 IMPLANT
SUT PROLENE 6 0 BV (SUTURE) ×1 IMPLANT
SUT VIC AB 1 CT1 18XBRD ANBCTR (SUTURE) ×1 IMPLANT
SUT VIC AB 1 CT1 8-18 (SUTURE) ×4
SUT VIC AB 2-0 CP2 18 (SUTURE) ×3 IMPLANT
SUT VIC AB 3-0 SH 8-18 (SUTURE) ×3 IMPLANT
SYR 20ML ECCENTRIC (SYRINGE) ×2 IMPLANT
SYR 3ML LL SCALE MARK (SYRINGE) ×8 IMPLANT
TOWEL OR 17X24 6PK STRL BLUE (TOWEL DISPOSABLE) ×2 IMPLANT
TOWEL OR 17X26 10 PK STRL BLUE (TOWEL DISPOSABLE) ×2 IMPLANT
TRAP SPECIMEN MUCOUS 40CC (MISCELLANEOUS) ×2 IMPLANT
TRAY FOLEY CATH 14FRSI W/METER (CATHETERS) ×2 IMPLANT
WATER STERILE IRR 1000ML POUR (IV SOLUTION) ×2 IMPLANT

## 2013-03-06 NOTE — Anesthesia Postprocedure Evaluation (Signed)
  Anesthesia Post-op Note  Patient: Jeanette [name]  Procedure(s) Performed: Procedure(s): LUMBAR TWO-THREE POSTERIOR LUMBAR INTERBODY FUSION WITH HARDWARE REMOVAL (N/A)  Patient Location: PACU  Anesthesia Type:General  Level of Consciousness: awake  Airway and Oxygen Therapy: Patient Spontanous Breathing  Post-op Pain: mild  Post-op Assessment: Post-op Vital signs reviewed, Patient's Cardiovascular Status Stable, Respiratory Function Stable, Patent Airway, No signs of Nausea or vomiting and Pain level controlled  Post-op Vital Signs: Reviewed and stable  Complications: No apparent anesthesia complications

## 2013-03-06 NOTE — Op Note (Signed)
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 Surgeon: Barnett Abu First assistant: Karene Fry  Anesthesia: General endotracheal Indications Jeanette [name] is a 67 year old individual who has had previous decompression and fusion from L3-L5 done over 10 years ago she did well for a long period of time but has now developed a retrolisthesis and stenosis at the level of L2-L3 above or few segment. As also noted that her fusion healed and a flattened configuration such that she has a flat lumbar spine. She was advised regarding the need for surgical decompression and stabilization at L2-L3.  Procedure: The patient was brought to the operating and supine on a stretcher. After the smooth induction of general endotracheal anesthesia, she was turned prone. The back was prepped with alcohol and DuraPrep. A midline incision was created in the bed of the old scar. This was carried down to the lumbar dorsal fascia. The periosteal tissues were stripped from the region of L2 to expose the interlaminar space at L2-3 and the previous hardware from L3-L5. By loosening the hardware we were able to remove the rod and the caps and the individual screws at L3-L4 and L5. Then a laminectomy was created removing the inferior margin of the lamina of L2 and the entire facet complex at L2-L3. A remnant of the laminar arch of L3 was also left and this was removed. While working on the  superior medial aspect of the decompression in inadvertent durotomy was created. This was on the very dorsum of the spinal canal. A larger laminectomy was then created of L2 removing the entire spinous process and laminar arch. This exposed the dural incursion. He was able to be closed with a singular figure-of-eight suture of 6-0 Prolene. No further leakage of spinal fluid was observed. The decompression was then carried laterally to expose and decompress the L2 nerve roots on either side. They were severely compressed secondary to an overgrowth of ligamentous material and the superior articular process of L3 which compressed each lateral recess. I decompressing this region the L2 nerve roots were free. Attention was turned to the L3 nerve roots which were decompressed by performing a foraminotomy over the root inferiorly. The common dural tube was then mobilized. The disc was noted meat markedly degenerated. Is a large posterior osteophyte from the inferior margin of the vertebral body of L2. This was taken down with an osteophyte tool. The disc space was then entered on either side and a series of disc shavers were used to evacuate significant quantities of the degenerated disc material. This space was able to BX handed only to approximately a 10 mm size. Because of the nature of the patient's flap back it was desirable to create lordosis at this interspace. After the endplates were completely removed the interspace was sized for appropriate size spacer to be placed ventrally. Is felt that a singular transforaminal type graft would fit best against the ventral aspect of the interspace. A 12 mm size was chosen and this was tamped into position while distractors in the interbody space were being used. Behind this we packed with local autograft and some the  cost bone graft sponges into the interspace. The position of the implant was checked radiographically. Pedicle screws were then placed in the L2. These were 6.5  x 50 mm screws with good purchase through the pedicles. 7.5 x 40 mm screws were then placed into L4 and L5. Because of the orientation of the L3 screws these were left out as they would not allow placement of both the L2 and L3 screws area 80 mm rods were then used to connect the construct on the left and the right. The inferior portion of the construct was tightened first and then a rod compressor was used to create lordosis at the level of L2-L3. This was checked radiographically before locking the caps into final position. The lateral gutters which had previously then been decorticated were packed with the remainder of autograft and allograft between L2 and L3. Hemostasis was checked meticulously and when verified the pads of the nerve roots were again checked to make sure that there was no bone or other debris impinging them into the exit foramen. When this was verified, the retractor was removed and the wound was closed. 1 Vicryl was used in the lumbar dorsal fascia 2-0 Vicryl was used subcutaneous a knee sleeve and 3-0 Vicryl subcuticularly. Blood loss for the procedure was estimated at 600 cc, approximately 200 cc of Cell Saver blood was returned to the patient.

## 2013-03-06 NOTE — Transfer of Care (Signed)
Immediate Anesthesia Transfer of Care Note  Patient: Jeanette [name]  Procedure(s) Performed: Procedure(s): LUMBAR TWO-THREE POSTERIOR LUMBAR INTERBODY FUSION WITH HARDWARE REMOVAL (N/A)  Patient Location: PACU  Anesthesia Type:General  Level of Consciousness: awake, oriented, patient cooperative and lethargic  Airway & Oxygen Therapy: Patient Spontanous Breathing and Patient connected to face mask oxygen  Post-op Assessment: Report given to PACU RN, Post -op Vital signs reviewed and stable and Patient moving all extremities  Post vital signs: Reviewed and stable  Complications: No apparent anesthesia complications

## 2013-03-06 NOTE — H&P (Signed)
Jeanette [name] is an 67 y.o. female.   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 She's been advised regarding the need for surgery and is admitted to undergo surgical decompression and stabilization at this adjacent level.  Past Medical History  Diagnosis Date  . Endometrial polyp   . TIA (transient ischemic attack)   . Hypertension   . Thyroid disease   . PONV (postoperative nausea and vomiting)     has scop patch to apply  10/13/12  . Hypothyroidism   . Pneumonia     hx  . Arthritis   . Depression     for mood swings menopause  . Asthma     hx  . Stroke     tia's    Past Surgical History  Procedure Laterality Date  . Knee surgery Right     67 yrs old  . Dilation and curettage of uterus    . Hysteroscopy    . Neck surgery  07,03    cerv,Fusion  . Rotator cuff repair Right 13  . Anterior cervical decomp/discectomy fusion N/A 10/14/2012    Procedure: ANTERIOR CERVICAL DECOMPRESSION/DISCECTOMY FUSION 1 LEVEL/HARDWARE REMOVAL;  Surgeon: Barnett Abu, MD;  Location: [facility];  Service: Neurosurgery;  Laterality: N/A;  C7-T1 Anterior cervical decompression/diskectomy/fusion/removal of old synthes plate  . Back surgery  01  . Tonsillectomy      Family History  Problem Relation Age of Onset  . Ovarian cancer Mother   . Cancer Mother     Leukemia  . Leukemia Mother   . Hypertension Father   . Heart disease Father    Social History:  reports that she has never smoked. She does not have any smokeless tobacco history on file. She reports that she drinks about 1.8 ounces of alcohol per week. She reports that she does not use illicit drugs.  Allergies:  Allergies  Allergen Reactions  . Compazine  [Prochlorperazine Edisylate] Swelling    Mouth and tongue swell, throat swells closed    Medications Prior to Admission  Medication Sig Dispense Refill  . aspirin 81 MG tablet Take 81 mg by mouth every other day.       . Calcium Carbonate-Vitamin D (CALCIUM + D PO) Take 1 tablet by mouth daily.       . Cholecalciferol (VITAMIN D PO) Take 5,000 Units by mouth daily.       Marland Kitchen levothyroxine (SYNTHROID, LEVOTHROID) 75 MCG tablet Take 75 mcg by mouth daily.      . Multiple Vitamin (MULTIVITAMIN) tablet Take 1 tablet by mouth daily.      . propranolol ER (INDERAL LA) 80 MG 24 hr capsule Take 80 mg by mouth daily.      Marland Kitchen venlafaxine (EFFEXOR) 37.5 MG tablet Take 37.5 mg by mouth every other day.       . albuterol (PROVENTIL HFA;VENTOLIN HFA) 108 (90 BASE) MCG/ACT inhaler Inhale 2 puffs into the lungs every 6 (six) hours as needed for wheezing.        Results for orders placed during the hospital encounter of 03/06/13 (from the past 48 hour(s))  CBC     Status: Abnormal   Collection Time    03/06/13  7:37 AM      Result Value Range  WBC 10.6 (*) 4.0 - 10.5 K/uL   RBC 4.48  3.87 - 5.11 MIL/uL   Hemoglobin 13.7  12.0 - 15.0 g/dL   HCT 45.4  09.8 - 11.9 %   MCV 92.0  78.0 - 100.0 fL   MCH 30.6  26.0 - 34.0 pg   MCHC 33.3  30.0 - 36.0 g/dL   RDW 14.7  82.9 - 56.2 %   Platelets 269  150 - 400 K/uL  DIFFERENTIAL     Status: None   Collection Time    03/06/13  7:37 AM      Result Value Range   Neutrophils Relative % 62  43 - 77 %   Neutro Abs 6.5  1.7 - 7.7 K/uL   Lymphocytes Relative 29  12 - 46 %   Lymphs Abs 3.0  0.7 - 4.0 K/uL   Monocytes Relative 8  3 - 12 %   Monocytes Absolute 0.8  0.1 - 1.0 K/uL   Eosinophils Relative 1  0 - 5 %   Eosinophils Absolute 0.2  0.0 - 0.7 K/uL   Basophils Relative 1  0 - 1 %   Basophils Absolute 0.1  0.0 - 0.1 K/uL   No results found.  Review of Systems  Constitutional: Negative.   HENT: Negative.   Eyes: Negative.   Respiratory: Negative.    Cardiovascular: Negative.   Gastrointestinal: Negative.   Genitourinary: Negative.   Musculoskeletal: Positive for back pain.  Skin: Negative.   Neurological: Positive for tingling and focal weakness.  Endo/Heme/Allergies: Negative.   Psychiatric/Behavioral: Negative.     Blood pressure 142/88, pulse 49, temperature 97.3 F (36.3 C), temperature source Oral, resp. rate 16, SpO2 99.00%. Physical Exam  Constitutional: She is oriented to person, place, and time. She appears well-developed and well-nourished.  HENT:  Head: Normocephalic and atraumatic.  Eyes: Conjunctivae and EOM are normal. Pupils are equal, round, and reactive to light.  Neck: Normal range of motion. Neck supple.  Cardiovascular: Normal rate, regular rhythm and normal heart sounds.   Respiratory: Effort normal and breath sounds normal.  GI: Soft. Bowel sounds are normal.  Musculoskeletal:  Moderate back pain to palpation and percussion. Lower extremity strength is modestly weak in iliopsoas and quads bilaterally. Tibialis anterior and gastrocs are intact.  Neurological: She is alert and oriented to person, place, and time.  Radial nerve examination reveals pupils are 3 mm briskly reactive light and accommodation extract the movements are full face is symmetric tongue and uvula are midline sclerae and conjunctiva are clear motor function in the upper and lower extremities is as above. Up or extremity strength is normal. GEN reflexes are absent in the patellae and Achilles both.  Skin: Skin is warm and dry.  Psychiatric: She has a normal mood and affect. Her behavior is normal. Judgment and thought content normal.     Assessment/Plan Stenosis L2-L3 status post arthrodesis L3 to sacrum  Jeanette [name] J 03/06/2013, 9:38 AM

## 2013-03-06 NOTE — Anesthesia Procedure Notes (Signed)
Procedure Name: Intubation Date/Time: 03/06/2013 10:12 AM Performed by: Barnett Abu Pre-anesthesia Checklist: Patient identified, Timeout performed, Emergency Drugs available, Suction available and Patient being monitored Patient Re-evaluated:Patient Re-evaluated prior to inductionOxygen Delivery Method: Circle system utilized and Simple face mask Preoxygenation: Pre-oxygenation with 100% oxygen Intubation Type: IV induction Ventilation: Mask ventilation without difficulty Laryngoscope size: glidescope. Grade View: Grade I Tube type: Oral Tube size: 7.0 mm Number of attempts: 1 Airway Equipment and Method: Patient positioned with wedge pillow and Stylet Placement Confirmation: ETT inserted through vocal cords under direct vision,  positive ETCO2 and breath sounds checked- equal and bilateral Secured at: 21 cm Tube secured with: Tape Dental Injury: Teeth and Oropharynx as per pre-operative assessment  Difficulty Due To: Difficulty was anticipated and Difficult Airway- due to reduced neck mobility

## 2013-03-06 NOTE — Preoperative (Signed)
Beta Blockers   Reason not to administer Beta Blockers:last dose propranolol 10-27

## 2013-03-07 LAB — BASIC METABOLIC PANEL
BUN: 14 mg/dL (ref 6–23)
Calcium: 8.2 mg/dL — ABNORMAL LOW (ref 8.4–10.5)
Creatinine, Ser: 0.69 mg/dL (ref 0.50–1.10)
GFR calc Af Amer: >90 mL/min (ref >90)
GFR calc non Af Amer: 87 mL/min — ABNORMAL LOW (ref >90)
Glucose, Bld: 117 mg/dL — ABNORMAL HIGH (ref 70–99)

## 2013-03-07 LAB — CBC
Hemoglobin: 9 g/dL — ABNORMAL LOW (ref 12.0–15.0)
MCH: 31 pg (ref 26.0–34.0)
MCHC: 33.5 g/dL (ref 30.0–36.0)
MCV: 92.8 fL (ref 78.0–100.0)
Platelets: 215 10*3/uL (ref 150–400)

## 2013-03-07 MED FILL — Sodium Chloride Irrigation Soln 0.9%: Qty: 3000 | Status: AC

## 2013-03-07 MED FILL — Sodium Chloride IV Soln 0.9%: INTRAVENOUS | Qty: 1000 | Status: AC

## 2013-03-07 MED FILL — Heparin Sodium (Porcine) Inj 1000 Unit/ML: INTRAMUSCULAR | Qty: 30 | Status: AC

## 2013-03-07 NOTE — Progress Notes (Signed)
CRITICAL VALUE ALERT  Critical value received:  hgb of8.5  Date of notification:  03/07/13  Time of notification:  0830  Critical value read back:yes  Nurse who received alert:  Johnell Comings  MD notified (1st page):  Barnett Abu  Time of first page:  [phone]  MD notified (2nd page):Henry Elsner  Time of second RUEA:[id number]  Responding MD:  Barnett Abu  Time MD responded:  [phone]

## 2013-03-07 NOTE — Progress Notes (Signed)
Pt's BP read 79/40 this morning while sitting up in chair,c/o of being nauseous and slightly dizzy,Dr Elsner paged and notified,said to wait for pt's CBC result and to increase her iv fluids to 166ml/hr,same commenced,pt reassured,information related to the day nurse Angelito. Obasogie-Asidi, Jeanette [name]

## 2013-03-07 NOTE — Progress Notes (Signed)
Orthopedic Tech Progress Note Patient Details:  Jeanette [name] 1945-12-22 [id number]  Patient ID: Jeanette [name], female   DOB: [birth year], 67 y.o.   MRN: [id number]   Jeanette [name] 03/07/2013, 8:39 AMCalled bio-tech for aspen lumbar fusion brace.

## 2013-03-07 NOTE — Evaluation (Addendum)
Occupational Therapy Evaluation Patient Details Name: Jeanette [name] MRN: [id number] DOB: 08-10-1945 Today's Date: 03/07/2013 Time: 9562-1308 OT Time Calculation (min): 26 min  OT Assessment / Plan / Recommendation History of present illness pt presents with L2-3 PLIF.     Clinical Impression   Pt presents with below problem list. Pt moving well during evaluation. Pt independent with ADLs, PTA. Pt will benefit from acute OT to increase independence prior to d/c.     OT Assessment  Patient needs continued OT Services    Follow Up Recommendations  No OT follow up;Supervision/Assistance - 24 hour    Barriers to Discharge      Equipment Recommendations  3 in 1 bedside comode    Recommendations for Other Services    Frequency  Min 2X/week    Precautions / Restrictions Precautions Precautions: Back;Fall Precaution Booklet Issued: No Required Braces or Orthoses: Spinal Brace Spinal Brace: Lumbar corset;Applied in sitting position Restrictions Weight Bearing Restrictions: No   Pertinent Vitals/Pain Pain 10/10 while moving. Repositioned.     ADL  Eating/Feeding: Independent Where Assessed - Eating/Feeding: Chair Grooming: Wash/dry hands;Supervision/safety;Performed Where Assessed - Grooming: Supported standing Upper Body Bathing: Set up;Supervision/safety Where Assessed - Upper Body Bathing: Supported sitting Lower Body Bathing: Moderate assistance Where Assessed - Lower Body Bathing: Supported sit to stand Upper Body Dressing: Set up;Supervision/safety Where Assessed - Upper Body Dressing: Supported sitting Lower Body Dressing: Moderate assistance Where Assessed - Lower Body Dressing: Supported sit to stand Toilet Transfer: Hydrographic surveyor Method: Sit to Barista: Raised toilet seat with arms (or 3-in-1 over toilet);Regular height toilet;Grab bars Toileting - Clothing Manipulation and Hygiene: Minimal assistance (able to perform hygiene,  but breaking precautions) Where Assessed - Toileting Clothing Manipulation and Hygiene: Sit to stand from 3-in-1 or toilet Tub/Shower Transfer Method: Not assessed Equipment Used: Back brace;Rolling walker;Sock aid;Reacher;Long-handled sponge;Long-handled shoe horn Transfers/Ambulation Related to ADLs: Min guard ADL Comments: Educated on AE for LB ADLs. Educated to have husband pick up rugs in house (non skid in bathroom) and discussed use of bag on walker to help carry items. Educated on use of toilet aid for hygiene-cues for precautions during hygiene.  Educated to have grooming items on right side of sink and also use of two cups for teeth care to avoid breaking precautions.     OT Diagnosis: Acute pain  OT Problem List: Decreased strength;Decreased activity tolerance;Impaired balance (sitting and/or standing);Decreased knowledge of precautions;Decreased knowledge of use of DME or AE;Pain;Decreased range of motion OT Treatment Interventions: Self-care/ADL training;DME and/or AE instruction;Therapeutic activities;Balance training;Patient/family education   OT Goals(Current goals can be found in the care plan section) Acute Rehab OT Goals Patient Stated Goal: go home OT Goal Formulation: With patient Time For Goal Achievement: 03/14/13 Potential to Achieve Goals: Good ADL Goals Pt Will Perform Grooming: with modified independence;standing Pt Will Perform Lower Body Bathing: with modified independence;with adaptive equipment;sit to/from stand Pt Will Perform Lower Body Dressing: with modified independence;with adaptive equipment;sit to/from stand Pt Will Transfer to Toilet: with modified independence;ambulating (3 in 1 over commode) Pt Will Perform Toileting - Clothing Manipulation and hygiene: with modified independence;sit to/from stand;with adaptive equipment Pt Will Perform Tub/Shower Transfer: Shower transfer;with supervision;ambulating;rolling walker (tub equipment tbd) Additional ADL  Goal #1: Pt will independently verbalize 3/3 back precautions.  Visit Information  Last OT Received On: 03/07/13 Assistance Needed: +1 History of Present Illness: pt presents with L2-3 PLIF.         Prior Functioning  Home Living Family/patient expects to be discharged to:: Private residence Living Arrangements: Spouse/significant other Available Help at Discharge: Family Type of Home: House Home Access: Stairs to enter Secretary/administrator of Steps: 4 Entrance Stairs-Rails: Right;Left Home Layout: One level Home Equipment: None Prior Function Level of Independence: Independent Communication Communication: No difficulties Dominant Hand: Right         Vision/Perception     Cognition  Cognition Arousal/Alertness: Awake/alert Behavior During Therapy: WFL for tasks assessed/performed Overall Cognitive Status: Within Functional Limits for tasks assessed    Extremity/Trunk Assessment Upper Extremity Assessment Upper Extremity Assessment: Overall WFL for tasks assessed Lower Extremity Assessment Lower Extremity Assessment: Defer to PT evaluation     Mobility Bed Mobility Bed Mobility: Rolling Left;Left Sidelying to Sit Rolling Left: 5: Supervision Left Sidelying to Sit: 4: Min guard Details for Bed Mobility Assistance: cues for technique Transfers Transfers: Sit to Stand Sit to Stand: 4: Min guard;With upper extremity assist;From bed;From chair/3-in-1;From toilet Stand to Sit: 4: Min guard;With upper extremity assist;To chair/3-in-1;To toilet Details for Transfer Assistance: cues for technique.     Exercise     Balance     End of Session OT - End of Session Equipment Utilized During Treatment: Rolling walker;Back brace Activity Tolerance: Patient tolerated treatment well Patient left: in chair;with call bell/phone within reach;with family/visitor present  Lorri Frederick OTR/L [phone] 03/07/2013, 6:31 PM

## 2013-03-07 NOTE — Progress Notes (Signed)
UR COMPLETED  

## 2013-03-07 NOTE — Evaluation (Signed)
Physical Therapy Evaluation Patient Details Name: Jeanette [name] MRN: [id number] DOB: 15-Oct-1945 Today's Date: 03/07/2013 Time: 4098-1191 PT Time Calculation (min): 18 min  PT Assessment / Plan / Recommendation History of Present Illness  pt presents with L2-3 PLIF.    Clinical Impression  Pt moving well despite feeling nauseated.  Will continue to follow and anticpate great progress.      PT Assessment  Patient needs continued PT services    Follow Up Recommendations  No PT follow up;Supervision - Intermittent    Does the patient have the potential to tolerate intense rehabilitation      Barriers to Discharge        Equipment Recommendations  Rolling walker with 5" wheels    Recommendations for Other Services     Frequency Min 5X/week    Precautions / Restrictions Precautions Precautions: Back;Fall Precaution Booklet Issued: Yes (comment) Required Braces or Orthoses: Spinal Brace Spinal Brace: Lumbar corset;Applied in sitting position Restrictions Weight Bearing Restrictions: No   Pertinent Vitals/Pain Indicates incisional pain.  Premedicated.        Mobility  Bed Mobility Bed Mobility: Rolling Left;Left Sidelying to Sit;Sitting - Scoot to [place] of Bed;Sit to Sidelying Left Rolling Left: 4: Min guard Left Sidelying to Sit: 4: Min guard Sitting - Scoot to [place] of Bed: 4: Min guard Sit to Sidelying Left: 4: Min guard Details for Bed Mobility Assistance: hand over hand cueing for safe tehcnique and log roll.   Transfers Transfers: Sit to Stand;Stand to Sit Sit to Stand: 4: Min guard;With upper extremity assist;From bed Stand to Sit: 4: Min guard;With upper extremity assist;To bed Details for Transfer Assistance: cues for use of UEs, controlling descent to sit.   Ambulation/Gait Ambulation/Gait Assistance: 4: Min guard Ambulation Distance (Feet): 200 Feet Assistive device: Rolling walker Ambulation/Gait Assistance Details: cues for upright posture, use of RW,  encouragement.   Gait Pattern: Step-through pattern;Decreased stride length Stairs: No Wheelchair Mobility Wheelchair Mobility: No    Exercises     PT Diagnosis: Difficulty walking  PT Problem List: Decreased activity tolerance;Decreased balance;Decreased mobility;Decreased knowledge of use of DME;Decreased knowledge of precautions;Pain PT Treatment Interventions: DME instruction;Gait training;Stair training;Functional mobility training;Therapeutic activities;Therapeutic exercise;Balance training;Patient/family education     PT Goals(Current goals can be found in the care plan section) Acute Rehab PT Goals Patient Stated Goal: Home PT Goal Formulation: With patient Time For Goal Achievement: 03/14/13 Potential to Achieve Goals: Good  Visit Information  Last PT Received On: 03/07/13 Assistance Needed: +1 History of Present Illness: pt presents with L2-3 PLIF.         Prior Functioning  Home Living Family/patient expects to be discharged to:: Private residence Living Arrangements: Spouse/significant other Available Help at Discharge: Family Type of Home: House Home Access: Stairs to enter Secretary/administrator of Steps: 4 Entrance Stairs-Rails: Right;Left Home Layout: One level Home Equipment: None Prior Function Level of Independence: Independent Communication Communication: No difficulties    Cognition  Cognition Arousal/Alertness: Awake/alert Behavior During Therapy: WFL for tasks assessed/performed Overall Cognitive Status: Within Functional Limits for tasks assessed    Extremity/Trunk Assessment Upper Extremity Assessment Upper Extremity Assessment: Defer to OT evaluation Lower Extremity Assessment Lower Extremity Assessment: Overall WFL for tasks assessed Cervical / Trunk Assessment Cervical / Trunk Assessment: Normal   Balance Balance Balance Assessed: Yes Static Standing Balance Static Standing - Balance Support: Bilateral upper extremity  supported Static Standing - Level of Assistance: 5: Stand by assistance  End of Session PT - End of Session Equipment  Utilized During Treatment: Gait belt;Back brace Activity Tolerance: Patient tolerated treatment well Patient left: in bed;with call bell/phone within reach Nurse Communication: Mobility status  GP     Sunny Schlein, [place] [phone] 03/07/2013, 2:28 PM

## 2013-03-07 NOTE — Progress Notes (Signed)
Subjective: Patient reports A bit nauseous. Blood pressure has been low. 70s and 80s with heart rate between 60 and 80. Has been able to ambulate.  Objective: Vital signs in last 24 hours: Temp:  [97.5 F (36.4 C)-99.1 F (37.3 C)] 99.1 F (37.3 C) (10/28 1544) Pulse Rate:  [55-106] 85 (10/28 1544) Resp:  [7-20] 18 (10/28 1544) BP: (77-99)/(36-55) 82/42 mmHg (10/28 1546) SpO2:  [92 %-100 %] 100 % (10/28 1544) Weight:  [67.132 kg (148 lb)] 67.132 kg (148 lb) (10/27 1900)  Intake/Output from previous day: 10/27 0701 - 10/28 0700 In: 4050 [I.V.:3250; Blood:300; IV Piggyback:500] Out: 1875 [Urine:1125; Blood:750] Intake/Output this shift:    Color is good motor function is good in lower extremities to confrontational testing incisions are clean and dry.  Lab Results:  Recent Labs  03/07/13 0540 03/07/13 0927  WBC 13.2* 14.4*  HGB 8.5* 9.0*  HCT 26.6* 26.9*  PLT 187 215   BMET  Recent Labs  03/07/13 0540  NA 140  K 4.7  CL 105  CO2 31  GLUCOSE 117*  BUN 14  CREATININE 0.69  CALCIUM 8.2*    Studies/Results: Dg Lumbar Spine 2-3 Views  03/06/2013   CLINICAL DATA:  Lumbar disc disease and spinal stenosis.  EXAM: LUMBAR SPINE - 2-3 VIEW; DG C-ARM 1-60 MIN  COMPARISON:  MRI dated 01/27/2013  FINDINGS: AP and lateral C-arm images demonstrate the patient has undergone interbody and posterior fusion at L2-3. Pedicle screws have been removed at L3. New posterior rods connect the pedicle screws at L2 to the screws at L4 and L5.  IMPRESSION: Fusion performed at L2-3.   Electronically Signed   By: Geanie Cooley M.D.   On: 03/06/2013 15:56   Dg C-arm 1-60 Min  03/06/2013   CLINICAL DATA:  Lumbar disc disease and spinal stenosis.  EXAM: LUMBAR SPINE - 2-3 VIEW; DG C-ARM 1-60 MIN  COMPARISON:  MRI dated 01/27/2013  FINDINGS: AP and lateral C-arm images demonstrate the patient has undergone interbody and posterior fusion at L2-3. Pedicle screws have been removed at L3. New posterior  rods connect the pedicle screws at L2 to the screws at L4 and L5.  IMPRESSION: Fusion performed at L2-3.   Electronically Signed   By: Geanie Cooley M.D.   On: 03/06/2013 15:56    Assessment/Plan: Hypotension with blood pressures 70s and 80s. Relatively asymptomatic. No tachycardia. Has been on a beta blocker also. I suggested we hydrate with IV fluids recheck CBC as her hemoglobin is down to 9 likely secondary to acute blood loss anemia from surgery.  LOS: 1 day  Continue IV hydration recheck CBC.   Daily Crate J 03/07/2013, 3:50 PM

## 2013-03-08 LAB — CBC
HCT: 26.6 % — ABNORMAL LOW (ref 36.0–46.0)
Hemoglobin: 8.5 g/dL — ABNORMAL LOW (ref 12.0–15.0)
MCH: 29.9 pg (ref 26.0–34.0)
MCHC: 32 g/dL (ref 30.0–36.0)
MCHC: 32.6 g/dL (ref 30.0–36.0)
MCV: 93.7 fL (ref 78.0–100.0)
MCV: 94.7 fL (ref 78.0–100.0)
Platelets: 140 10*3/uL — ABNORMAL LOW (ref 150–400)
RDW: 13.8 % (ref 11.5–15.5)
RDW: 14 % (ref 11.5–15.5)
WBC: 13.2 10*3/uL — ABNORMAL HIGH (ref 4.0–10.5)
WBC: 15 10*3/uL — ABNORMAL HIGH (ref 4.0–10.5)

## 2013-03-08 NOTE — Progress Notes (Signed)
Physical Therapy Treatment Patient Details Name: Jeanette [name] MRN: [id number] DOB: 04-Mar-1946 Today's Date: 03/08/2013 Time: 4098-1191 PT Time Calculation (min): 24 min  PT Assessment / Plan / Recommendation  History of Present Illness pt presents with L2-3 PLIF.     PT Comments   Pt did well today despite nausea. Nursing administered meds prior to tx.  Held stair training today due to nausea.  Pt did well and was safe with functional bathroom activities.  Pt educated on repositioning to prevent pressure sores.  Pt able to recall 2/3 back precautions.   Follow Up Recommendations  No PT follow up;Supervision - Intermittent     Does the patient have the potential to tolerate intense rehabilitation     Barriers to Discharge        Equipment Recommendations  Rolling walker with 5" wheels    Recommendations for Other Services    Frequency Min 5X/week   Progress towards PT Goals Progress towards PT goals: Progressing toward goals  Plan      Precautions / Restrictions Precautions Precautions: Back;Fall Precaution Booklet Issued: No Required Braces or Orthoses: Spinal Brace Spinal Brace: Lumbar corset;Applied in sitting position Restrictions Weight Bearing Restrictions: No   Pertinent Vitals/Pain 7/10 LBP; pt positioned in bed after tx for comfort.    Mobility  Bed Mobility Bed Mobility: Rolling Right;Rolling Left;Sit to Sidelying Left Rolling Right: 5: Supervision Rolling Left: 5: Supervision Sit to Sidelying Left: 4: Min assist Details for Bed Mobility Assistance: Assistance needed to lift LE's Transfers Transfers: Sit to Stand;Stand to Sit Sit to Stand: From toilet;From bed;5: Supervision;With upper extremity assist Stand to Sit: To bed;With upper extremity assist Ambulation/Gait Ambulation/Gait Assistance: 5: Supervision Ambulation Distance (Feet): 120 Feet Assistive device: Rolling walker Ambulation/Gait Assistance Details: VC's for erect posture Gait Pattern:  Step-through pattern;Decreased stride length Gait velocity: decreased General Gait Details: Gait limited due to nausea  Stairs: No Wheelchair Mobility Wheelchair Mobility: No    Exercises     PT Diagnosis:    PT Problem List:   PT Treatment Interventions:     PT Goals (current goals can now be found in the care plan section)    Visit Information  Last PT Received On: 03/08/13 Assistance Needed: +1 History of Present Illness: pt presents with L2-3 PLIF.      Subjective Data      Cognition  Cognition Arousal/Alertness: Awake/alert Behavior During Therapy: WFL for tasks assessed/performed Overall Cognitive Status: Within Functional Limits for tasks assessed    Balance     End of Session PT - End of Session Equipment Utilized During Treatment: Gait belt;Back brace Activity Tolerance: Patient tolerated treatment well;Other (comment) (Limited by nausea) Patient left: in bed;with call bell/phone within reach Nurse Communication: Mobility status;Other (comment) (nausea medication)   GP     Wilmina Maxham, SPTA 03/08/2013, 9:08 AM

## 2013-03-08 NOTE — Progress Notes (Signed)
Occupational Therapy Treatment Patient Details Name: Jeanette [name] MRN: [id number] DOB: [birth year] Today's Date: 03/08/2013 Time: 1914-7829 OT Time Calculation (min): 49 min  OT Assessment / Plan / Recommendation  History of present illness pt presents with L2-3 PLIF.     OT comments  Pt progressing towards goals. Provided education to pt and husband. Pt practiced tub/shower transfer as well as toileting, grooming, and using AE for LB ADLs.   Follow Up Recommendations  No OT follow up;Supervision - Intermittent    Barriers to Discharge       Equipment Recommendations  3 in 1 bedside comode    Recommendations for Other Services    Frequency Min 2X/week   Progress towards OT Goals Progress towards OT goals: Progressing toward goals  Plan Discharge plan needs to be updated    Precautions / Restrictions Precautions Precautions: Back;Fall Precaution Booklet Issued: Yes (comment) Required Braces or Orthoses: Spinal Brace Spinal Brace: Lumbar corset;Applied in sitting position Restrictions Weight Bearing Restrictions: No   Pertinent Vitals/Pain Pain 8/10 moving. Repositioned.     ADL  Grooming: Performed;Teeth care;Brushing hair;Supervision/safety;Set up Where Assessed - Grooming: Supported standing Upper Body Dressing: Supervision/safety;Set up (back brace) Where Assessed - Upper Body Dressing: Unsupported sitting Lower Body Dressing: Min guard Where Assessed - Lower Body Dressing: Unsupported sit to stand Toilet Transfer: Min Pension scheme manager Method: Sit to Barista: Raised toilet seat with arms (or 3-in-1 over toilet) Toileting - Clothing Manipulation and Hygiene: Minimal assistance Where Assessed - Glass blower/designer Manipulation and Hygiene: Standing;Sit to stand from 3-in-1 or toilet Tub/Shower Transfer: Minimal assistance Tub/Shower Transfer Method: Ambulating Tub/Shower Transfer Equipment: Other (comment);Walk in shower (3 in 1; also  practiced stepping over tub) Equipment Used: Back brace;Gait belt;Sock aid;Rolling walker;Reacher Transfers/Ambulation Related to ADLs: Min guard for ambulation; Min A for simulated tub and shower transfer; Min guard for <> stand transfers. ADL Comments: Practiced simulated shower and tub transfer (did not fully complete tub transfer as pt feels more comfortable with shower), practiced with AE for LB ADLs. OT provided education to pt and spouse.  Performed grooming at sink with use of two cups for teeth care. Educated again about use of toilet aid. Pt able to state all back precautions. Cues for precautions during session.    OT Diagnosis:    OT Problem List:   OT Treatment Interventions:     OT Goals(current goals can now be found in the care plan section) Acute Rehab OT Goals Patient Stated Goal: not stated OT Goal Formulation: With patient Time For Goal Achievement: 03/14/13 Potential to Achieve Goals: Good ADL Goals Pt Will Perform Grooming: with modified independence;standing Pt Will Perform Lower Body Bathing: with modified independence;with adaptive equipment;sit to/from stand Pt Will Perform Lower Body Dressing: with modified independence;with adaptive equipment;sit to/from stand Pt Will Transfer to Toilet: with modified independence;ambulating (3 in 1 over commode) Pt Will Perform Toileting - Clothing Manipulation and hygiene: with modified independence;sit to/from stand;with adaptive equipment Pt Will Perform Tub/Shower Transfer: Shower transfer;with supervision;ambulating;rolling walker (tub equipment tbd) Additional ADL Goal #1: Pt will independently verbalize 3/3 back precautions.  Visit Information  Last OT Received On: 03/08/13 Assistance Needed: +1 History of Present Illness: pt presents with L2-3 PLIF.      Subjective Data      Prior Functioning       Cognition  Cognition Arousal/Alertness: Awake/alert Behavior During Therapy: WFL for tasks  assessed/performed Overall Cognitive Status: Within Functional Limits for tasks assessed    Mobility  Bed Mobility Bed Mobility: Rolling Left;Left Sidelying to Sit Rolling Left: 4: Min assist Left Sidelying to Sit: 4: Min guard Details for Bed Mobility Assistance: Min A to roll to side. Cues for technique. Transfers Transfers: Sit to Stand;Stand to Sit Sit to Stand: 4: Min guard;With upper extremity assist;From chair/3-in-1;From bed Stand to Sit: 4: Min guard;With upper extremity assist;To chair/3-in-1;To bed Details for Transfer Assistance: Min guard for safety.     Exercises      Balance     End of Session OT - End of Session Equipment Utilized During Treatment: Rolling walker;Back brace;Gait belt Activity Tolerance: Patient tolerated treatment well Patient left: in chair;with family/visitor present  GO     Earlie Raveling OTR/L [phone] 03/08/2013, 11:33 AM

## 2013-03-08 NOTE — Progress Notes (Signed)
Seen and agreed 03/08/2013 Fredrich Birks PTA [phone] pager [phone] office

## 2013-03-08 NOTE — Progress Notes (Signed)
Referred to this CSW today for ?SNF. Chart reviewed and have spoken with patient and her husband-  plans to d/c home with [facility] and DME. CSW to sign off- please contact us if SW needs arise. Reece Levy, MSW, Theresia Majors [phone]

## 2013-03-08 NOTE — Progress Notes (Signed)
Pt. Was nauseated this morning.  Gave her zofran.  Zofran didn't help and she vomited.  This evening patient is nauseated again.  Zofran given.  She's not feeling well to eat.  Only drinking gingerale.  Incentive Spirometer was given and taught how to use.  Will continue to monitor patient.  Call light placed within reach.

## 2013-03-09 MED ORDER — PROMETHAZINE HCL 25 MG/ML IJ SOLN
25.0000 mg | Freq: Four times a day (QID) | INTRAMUSCULAR | Status: DC | PRN
  Administered 2013-03-09: 25 mg via INTRAMUSCULAR
  Filled 2013-03-09: qty 1

## 2013-03-09 MED ORDER — HYDROCODONE-ACETAMINOPHEN 5-325 MG PO TABS
1.0000 | ORAL_TABLET | ORAL | Status: DC | PRN
  Administered 2013-03-09 (×2): 2 via ORAL
  Administered 2013-03-09 – 2013-03-11 (×5): 1 via ORAL
  Filled 2013-03-09 (×2): qty 2
  Filled 2013-03-09 (×5): qty 1

## 2013-03-09 MED ORDER — PROMETHAZINE HCL 25 MG PO TABS
25.0000 mg | ORAL_TABLET | Freq: Four times a day (QID) | ORAL | Status: DC | PRN
  Administered 2013-03-09 – 2013-03-11 (×2): 25 mg via ORAL
  Filled 2013-03-09 (×2): qty 1

## 2013-03-09 NOTE — Progress Notes (Signed)
PT Cancellation Note  Patient Details Name: Jeanette [name] MRN: [id number] DOB: [birth year]   Cancelled Treatment:    Reason Eval/Treat Not Completed: Pain limiting ability to participate. Patient declined PT x3 today with complaints of nausea and increased pain. RN made aware. Will reattempt tomorrow   Fredrich Birks 03/09/2013, 1:07 PM

## 2013-03-09 NOTE — Progress Notes (Signed)
Subjective Patient reports Still reports nausea. Has been receiving IVF at 150 /hr. Normal saline. Feels and looks puffy. Also notedanemia.  Objective: Vital signs in last 24 hours: Temp:  [97.2 F (36.2 C)-100.1 F (37.8 C)] 97.2 F (36.2 C) (10/30 0600) Pulse Rate:  [52-88] 72 (10/30 0600) Resp:  [16-20] 16 (10/30 0600) BP: (96-117)/(49-59) 108/58 mmHg (10/30 0600) SpO2:  [95 %-100 %] 95 % (10/30 0600)  Intake/Output from previous day: 10/29 0701 - 10/30 0700 In: 600 [P.O.:600] Out: -  Intake/Output this shift: Total I/O In: 240 [P.O.:240] Out: -   Motor function ok. Sitting up. Feels nausea.  Lab Results:  Recent Labs  03/07/13 0927 03/08/13 0550  WBC 14.4* 15.0*  HGB 9.0* 7.5*  HCT 26.9* 23.0*  PLT 215 140*   BMET  Recent Labs  03/07/13 0540  NA 140  K 4.7  CL 105  CO2 31  GLUCOSE 117*  BUN 14  CREATININE 0.69  CALCIUM 8.2*    Studies/Results: No results found.  Assessment/Plan: Change pain med. Change anti nausea drug. Kvo IVF.2   LOS: 3 days  re check cbc  in am.   Ross Hefferan J 03/09/2013, 9:19 AM

## 2013-03-10 LAB — CBC
Hemoglobin: 7.8 g/dL — ABNORMAL LOW (ref 12.0–15.0)
MCH: 30 pg (ref 26.0–34.0)
MCHC: 32.2 g/dL (ref 30.0–36.0)
Platelets: 219 10*3/uL (ref 150–400)
WBC: 14.4 10*3/uL — ABNORMAL HIGH (ref 4.0–10.5)

## 2013-03-10 MED ORDER — MAGNESIUM HYDROXIDE 400 MG/5ML PO SUSP
30.0000 mL | Freq: Once | ORAL | Status: AC
  Administered 2013-03-10: 30 mL via ORAL
  Filled 2013-03-10: qty 30

## 2013-03-10 MED ORDER — METHOCARBAMOL 500 MG PO TABS: 500.0000 mg | ORAL_TABLET | Freq: Four times a day (QID) | ORAL | Status: DC | PRN

## 2013-03-10 MED ORDER — HYDROCODONE-ACETAMINOPHEN 5-325 MG PO TABS: 1.0000 | ORAL_TABLET | ORAL | Status: DC | PRN

## 2013-03-10 NOTE — Discharge Summary (Signed)
Physician Discharge Summary  Patient ID: Jeanette [name] MRN: [id number] DOB/AGE: 08-03-1945 67 y.o.  Admit date: 03/06/2013 Discharge date: 03/10/2013  Admission Diagnoses: Lumbar spinal stenosis L2-L3 status post arthrodesis L3-L5, lumbar radiculopathy  Discharge Diagnoses: Lumbar spinal stenosis L2-L3, status post arthrodesis L3-L5, lumbar radiculopathy Acute blood loss anemia Postoperative ileus Active Problems:   * No active hospital problems. *   Discharged Condition: good  Hospital Course: Patient was admitted to undergo surgical decompression at L2-L3 with stabilization. She's had a previous fusion at L3-L5. She tolerated her surgery well. During the postoperative period she was anemic. Her hemoglobin decreased to below 9. Her hematocrit at the time of discharge is increased to 23 with a hemoglobin of 7.8.  Consults: None  Significant Diagnostic Studies: None  Treatments: surgery: Decompression of L2-L3 with stabilization of L2-L4.  Discharge Exam: Blood pressure 127/77, pulse 68, temperature 98.4 F (36.9 C), temperature source Oral, resp. rate 18, height 5' (1.524 m), weight 67.132 kg (148 lb), SpO2 96.00%. Incision is clean and dry motor function is intact in lower extremities. Station and gait are normal.  Disposition: 01-Home or Self Care  Discharge Orders   Future Orders Complete By Expires   Call MD for:  redness, tenderness, or signs of infection (pain, swelling, redness, odor or green/yellow discharge around incision site)  As directed    Call MD for:  severe uncontrolled pain  As directed    Call MD for:  temperature >100.4  As directed    Diet - low sodium heart healthy  As directed    Discharge instructions  As directed    Comments:     Okay to shower. Do not apply salves or appointments to incision. No heavy lifting with the upper extremities greater than 15 pounds. May resume driving when not requiring pain medication and patient feels comfortable with  doing so.   Increase activity slowly  As directed        Medication List         albuterol 108 (90 BASE) MCG/ACT inhaler  Commonly known as:  PROVENTIL HFA;VENTOLIN HFA  Inhale 2 puffs into the lungs every 6 (six) hours as needed for wheezing.     aspirin 81 MG tablet  Take 81 mg by mouth every other day.     CALCIUM + D PO  Take 1 tablet by mouth daily.     HYDROcodone-acetaminophen 5-325 MG per tablet  Commonly known as:  NORCO/VICODIN  Take 1-2 tablets by mouth every 3 (three) hours as needed for pain.     levothyroxine 75 MCG tablet  Commonly known as:  SYNTHROID, LEVOTHROID  Take 75 mcg by mouth daily.     methocarbamol 500 MG tablet  Commonly known as:  ROBAXIN  Take 1 tablet (500 mg total) by mouth every 6 (six) hours as needed.     multivitamin tablet  Take 1 tablet by mouth daily.     propranolol ER 80 MG 24 hr capsule  Commonly known as:  INDERAL LA  Take 80 mg by mouth daily.     venlafaxine 37.5 MG tablet  Commonly known as:  EFFEXOR  Take 37.5 mg by mouth every other day.     VITAMIN D PO  Take 5,000 Units by mouth daily.         SignedStefani Dama 03/10/2013, 6:11 PM

## 2013-03-10 NOTE — Progress Notes (Signed)
Subjective: Patient reports Feels better today. Less nausea. Started feeling better in the middle of last night.  Objective: Vital signs in last 24 hours: Temp:  [98 F (36.7 C)-98.8 F (37.1 C)] 98.4 F (36.9 C) (10/31 1715) Pulse Rate:  [68-86] 68 (10/31 1715) Resp:  [16-20] 18 (10/31 1715) BP: (116-164)/(62-96) 127/77 mmHg (10/31 1715) SpO2:  [86 %-99 %] 96 % (10/31 1715)  Intake/Output from previous day: 10/30 0701 - 10/31 0700 In: 240 [P.O.:240] Out: -  Intake/Output this shift:    Incision is clean and dry motor function is intact.  Lab Results:  Recent Labs  03/08/13 0550 03/10/13 0445  WBC 15.0* 14.4*  HGB 7.5* 7.8*  HCT 23.0* 24.2*  PLT 140* 219   BMET No results found for this basename: NA, K, CL, CO2, GLUCOSE, BUN, CREATININE, CALCIUM,  in the last 72 hours  Studies/Results: No results found.  Assessment/Plan: Plan discharge in a.m.   LOS: 4 days  Prescriptions written. We'll give milk of magnesia this evening. Also allow patient to shower.   Delmos Velaquez J 03/10/2013, 6:05 PM

## 2013-03-10 NOTE — Progress Notes (Signed)
Seen and agreed 03/10/2013 Robinette, Julia Elizabeth PTA [phone] pager [phone] office    

## 2013-03-10 NOTE — Progress Notes (Signed)
Physical Therapy Treatment Patient Details Name: Jeanette [name] MRN: [id number] DOB: [birth year] Today's Date: 03/10/2013 Time: 4098-1191 PT Time Calculation (min): 30 min  PT Assessment / Plan / Recommendation  History of Present Illness pt presents with L2-3 PLIF.     PT Comments   Pt initially felt nauseous and weak; however after initiating ambulation nausea began to subside.  Pt needed min assistance with bathroom functional activities today, cues for correct hand placement when sit <>standing on toilet; cues for no twisting while at sink doing functional activities.  Pt tolerated standing functional activities for 3 min with 1 UE support.  Pt needs stair training prior to d/c.  Follow Up Recommendations  No PT follow up;Supervision - Intermittent     Does the patient have the potential to tolerate intense rehabilitation     Barriers to Discharge        Equipment Recommendations  Rolling walker with 5" wheels    Recommendations for Other Services    Frequency Min 5X/week   Progress towards PT Goals Progress towards PT goals: Progressing toward goals  Plan      Precautions / Restrictions Precautions Precautions: Back;Fall Precaution Booklet Issued: Yes (comment) Required Braces or Orthoses: Spinal Brace Spinal Brace: Lumbar corset;Applied in sitting position Restrictions Weight Bearing Restrictions: No   Pertinent Vitals/Pain 4/10 LBP today; pt repositioned in chair post tx    Mobility  Bed Mobility Bed Mobility: Rolling Left;Left Sidelying to Sit;Sitting - Scoot to Edge of Bed Rolling Left: 5: Supervision Left Sidelying to Sit: 4: Min guard Sitting - Scoot to [place] of Bed: 4: Min guard Details for Bed Mobility Assistance: Cues for safe technique for sidelying ->sit; min guard for safety due to UE weakness and unsteady balance Transfers Transfers: Sit to Stand;Stand to Sit Sit to Stand: From toilet;From bed;4: Min guard Stand to Sit: To toilet;To chair/3-in-1;4: Min  guard Details for Transfer Assistance: Cues required for correct hand placement for safety Ambulation/Gait Ambulation/Gait Assistance: 5: Supervision Ambulation Distance (Feet): 225 Feet Assistive device: Rolling walker Ambulation/Gait Assistance Details: min cues for correct posture Gait Pattern: Step-through pattern;Decreased stride length Gait velocity: decreased Stairs: No    Exercises     PT Diagnosis:    PT Problem List:   PT Treatment Interventions:     PT Goals (current goals can now be found in the care plan section)    Visit Information  Last PT Received On: 03/10/13 Assistance Needed: +1 Reason Eval/Treat Not Completed: Pain limiting ability to participate History of Present Illness: pt presents with L2-3 PLIF.      Subjective Data      Cognition  Cognition Arousal/Alertness: Awake/alert Behavior During Therapy: WFL for tasks assessed/performed Overall Cognitive Status: Within Functional Limits for tasks assessed    Balance     End of Session PT - End of Session Equipment Utilized During Treatment: Gait belt;Back brace Activity Tolerance: Patient tolerated treatment well;Other (comment) (Stair training held today due to initial nausea) Patient left: in chair;with call bell/phone within reach;with family/visitor present Nurse Communication: Mobility status   GP     Ernestina Columbia, SPTA 03/10/2013, 8:36 AM

## 2013-03-11 NOTE — Progress Notes (Signed)
Occupational Therapy Treatment Patient Details Name: Jeanette [name] MRN: [id number] DOB: 21-Sep-1945 Today's Date: 03/11/2013 Time: 0272-5366 OT Time Calculation (min): 39 min  OT Assessment / Plan / Recommendation  History of present illness pt presents with L2-3 PLIF.     OT comments  Pt is appropriate level for d/c home from OT standpoint. Pt is pending RW delivery for d/c home equipment needs. Pt without further questions or concerns   Follow Up Recommendations  No OT follow up;Supervision - Intermittent    Barriers to Discharge       Equipment Recommendations  3 in 1 bedside comode;Other (comment) (RW)    Recommendations for Other Services    Frequency Min 2X/week   Progress towards OT Goals Progress towards OT goals: Progressing toward goals  Plan Discharge plan remains appropriate    Precautions / Restrictions Precautions Precautions: Back;Fall Required Braces or Orthoses: Spinal Brace Spinal Brace: Lumbar corset;Applied in sitting position Restrictions Weight Bearing Restrictions: No   Pertinent Vitals/Pain Minimal pain Recent medication provided.    ADL  Eating/Feeding: Independent Where Assessed - Eating/Feeding: Bed level Grooming: Wash/dry hands;Wash/dry face;Teeth care;Modified independent Where Assessed - Grooming: Unsupported standing Toilet Transfer: Modified independent Toilet Transfer Method: Sit to Barista: Regular height toilet;Grab bars Toileting - Clothing Manipulation and Hygiene: Modified independent Where Assessed - Toileting Clothing Manipulation and Hygiene: Sit to stand from 3-in-1 or toilet Equipment Used: Gait belt;Rolling walker;Back brace Transfers/Ambulation Related to ADLs: Pt ambulating at supervision level with RW ADL Comments: pt noted to have BIL UE tremors with intentional movement. pt progressing well with therapy. Pt educated on pain management, car transfer, dressing with brace, answered questions about  community ambulation and requesting an order from MD Chantee Cerino for RW for pending d/c. Care coordinator RN Tami contact to make sure [facility] delivers RW for pending d/c. Pt without further questions or concerns. Pt is ready for d/c from OT stand point    OT Diagnosis:    OT Problem List:   OT Treatment Interventions:     OT Goals(current goals can now be found in the care plan section) Acute Rehab OT Goals Patient Stated Goal: not stated OT Goal Formulation: With patient Time For Goal Achievement: 03/14/13 Potential to Achieve Goals: Good ADL Goals Pt Will Perform Grooming: with modified independence;standing Pt Will Perform Lower Body Bathing: with modified independence;with adaptive equipment;sit to/from stand Pt Will Perform Lower Body Dressing: with modified independence;with adaptive equipment;sit to/from stand Pt Will Transfer to Toilet: with modified independence;ambulating Pt Will Perform Toileting - Clothing Manipulation and hygiene: with modified independence;sit to/from stand;with adaptive equipment Pt Will Perform Tub/Shower Transfer: Shower transfer;with supervision;ambulating;rolling walker Additional ADL Goal #1: Pt will independently verbalize 3/3 back precautions.  Visit Information  Last OT Received On: 03/11/13 Assistance Needed: +1 History of Present Illness: pt presents with L2-3 PLIF.      Subjective Data      Prior Functioning       Cognition  Cognition Arousal/Alertness: Awake/alert Behavior During Therapy: WFL for tasks assessed/performed Overall Cognitive Status: Within Functional Limits for tasks assessed    Mobility  Bed Mobility Bed Mobility: Supine to Sit;Sitting - Scoot to Edge of Bed;Sit to Supine Supine to Sit: 6: Modified independent (Device/Increase time);HOB flat Sitting - Scoot to Edge of Bed: 6: Modified independent (Device/Increase time) Sit to Supine: 6: Modified independent (Device/Increase time);HOB flat Details for Bed Mobility  Assistance: good sequence without (A) Transfers Transfers: Sit to Stand;Stand to Sit Sit to Stand: 6:  Modified independent (Device/Increase time);With upper extremity assist;From bed Stand to Sit: 6: Modified independent (Device/Increase time);With upper extremity assist;To bed Details for Transfer Assistance: good sequence with incr time    Exercises      Balance     End of Session OT - End of Session Activity Tolerance: Patient tolerated treatment well Patient left: in bed;with call bell/phone within reach;with family/visitor present Nurse Communication: Mobility status;Precautions  GO   Pt declines lunch at this time due to pending d/c home. Pt informed that we can call and order lunch if she changes her mind.  Boone Master B 03/11/2013, 10:36 AM Pager: [phone]

## 2013-03-11 NOTE — Progress Notes (Signed)
Physical Therapy Treatment Patient Details Name: Jeanette [name] MRN: [id number] DOB: November 28, 1945 Today's Date: 03/11/2013 Time: 8657-8469 PT Time Calculation (min): 16 min  PT Assessment / Plan / Recommendation  History of Present Illness pt presents with L2-3 PLIF.     PT Comments   Patient is functioning at mod I level with mobility/gait and supervision for stairs.  Has achieved all PT goals - to be discharged from hospital today.  Will need RW for home use.  Follow Up Recommendations  No PT follow up;Supervision - Intermittent     Does the patient have the potential to tolerate intense rehabilitation     Barriers to Discharge        Equipment Recommendations  Rolling walker with 5" wheels    Recommendations for Other Services    Frequency Min 5X/week   Progress towards PT Goals Progress towards PT goals: Goals met/education completed, patient discharged from PT  Plan Current plan remains appropriate    Precautions / Restrictions Precautions Precautions: Back;Fall Precaution Comments: Patient able to recall 3/3 back precautions and adhere to them during mobility Required Braces or Orthoses: Spinal Brace Spinal Brace: Lumbar corset;Applied in sitting position Restrictions Weight Bearing Restrictions: No   Pertinent Vitals/Pain     Mobility  Bed Mobility Bed Mobility: Rolling Right;Right Sidelying to Sit;Sit to Sidelying Right Rolling Right: 6: Modified independent (Device/Increase time) Right Sidelying to Sit: 6: Modified independent (Device/Increase time);HOB flat (No rail) Supine to Sit: 6: Modified independent (Device/Increase time);HOB flat Sitting - Scoot to Edge of Bed: 6: Modified independent (Device/Increase time) Sit to Supine: 6: Modified independent (Device/Increase time);HOB flat Sit to Sidelying Right: 6: Modified independent (Device/Increase time);HOB flat (No rail) Details for Bed Mobility Assistance: Patient able to complete bed mobility with bed flat  and no rail at mod I level - required increased time.  Patient adheres to back precautions. Transfers Transfers: Sit to Stand;Stand to Sit Sit to Stand: 6: Modified independent (Device/Increase time);From bed Stand to Sit: 6: Modified independent (Device/Increase time);To bed Details for Transfer Assistance: Patient able to don back brace independently.  Patient uses proper technique - good hand placement and keeps back straight. Ambulation/Gait Ambulation/Gait Assistance: 6: Modified independent (Device/Increase time) Ambulation Distance (Feet): 200 Feet Assistive device: Rolling walker Ambulation/Gait Assistance Details: Patient demonstrates safe use of RW.  Good gait sequence and balance.  Upright posture. Gait Pattern: Step-through pattern;Decreased stride length Gait velocity: decreased Stairs: Yes Stairs Assistance: 5: Supervision Stairs Assistance Details (indicate cue type and reason): Patient attempted to go up stairs forward with 1 rail - unable.  Instructed patient to negotiate stairs using 1 rail sideways.  Instructed her and friend on how to have husband assist her safely on stairs. Stair Management Technique: One rail Right;Step to pattern;Sideways Number of Stairs: 4      PT Goals (current goals can now be found in the care plan section) Acute Rehab PT Goals Patient Stated Goal:   Visit Information  Last PT Received On: 03/11/13 Assistance Needed: +1 History of Present Illness: pt presents with L2-3 PLIF.      Subjective Data  Subjective: "I'm going home today" Patient Stated Goal: not stated   Cognition  Cognition Arousal/Alertness: Awake/alert Behavior During Therapy: WFL for tasks assessed/performed Overall Cognitive Status: Within Functional Limits for tasks assessed    Balance     End of Session PT - End of Session Equipment Utilized During Treatment: Gait belt;Back brace Activity Tolerance: Patient tolerated treatment well Patient left: in bed;with  call bell/phone within reach;with family/visitor present Nurse Communication: Mobility status   GP     Vena Austria 03/11/2013, 11:01 AM Durenda Hurt. Renaldo Fiddler, [facility] Acute Rehab Services Pager [phone]

## 2013-04-05 DIAGNOSIS — M545 Low back pain: Secondary | ICD-10-CM | POA: Diagnosis not present

## 2013-04-05 DIAGNOSIS — M48061 Spinal stenosis, lumbar region without neurogenic claudication: Secondary | ICD-10-CM | POA: Diagnosis not present

## 2013-04-11 DIAGNOSIS — I10 Essential (primary) hypertension: Secondary | ICD-10-CM | POA: Diagnosis not present

## 2013-04-11 DIAGNOSIS — D649 Anemia, unspecified: Secondary | ICD-10-CM | POA: Diagnosis not present

## 2013-04-19 DIAGNOSIS — D649 Anemia, unspecified: Secondary | ICD-10-CM | POA: Diagnosis not present

## 2013-04-19 DIAGNOSIS — I10 Essential (primary) hypertension: Secondary | ICD-10-CM | POA: Diagnosis not present

## 2013-04-19 DIAGNOSIS — D509 Iron deficiency anemia, unspecified: Secondary | ICD-10-CM | POA: Diagnosis not present

## 2013-04-19 DIAGNOSIS — E039 Hypothyroidism, unspecified: Secondary | ICD-10-CM | POA: Diagnosis not present

## 2013-04-25 DIAGNOSIS — E559 Vitamin D deficiency, unspecified: Secondary | ICD-10-CM | POA: Diagnosis not present

## 2013-04-25 DIAGNOSIS — J45909 Unspecified asthma, uncomplicated: Secondary | ICD-10-CM | POA: Diagnosis not present

## 2013-04-25 DIAGNOSIS — D72829 Elevated white blood cell count, unspecified: Secondary | ICD-10-CM | POA: Diagnosis not present

## 2013-04-25 DIAGNOSIS — I10 Essential (primary) hypertension: Secondary | ICD-10-CM | POA: Diagnosis not present

## 2013-05-11 HISTORY — PX: ROTATOR CUFF REPAIR: SHX139

## 2013-05-28 ENCOUNTER — Inpatient Hospital Stay (HOSPITAL_COMMUNITY): Payer: Medicare Other

## 2013-05-28 ENCOUNTER — Inpatient Hospital Stay (HOSPITAL_COMMUNITY)
Admission: EM | Admit: 2013-05-28 | Discharge: 2013-05-29 | DRG: 068 | Disposition: A | Discharge status: Home / Self Care | Payer: Medicare Other | Attending: Vascular Surgery | Admitting: Vascular Surgery

## 2013-05-28 ENCOUNTER — Emergency Department (HOSPITAL_COMMUNITY): Payer: Medicare Other

## 2013-05-28 ENCOUNTER — Encounter (HOSPITAL_COMMUNITY): Payer: Self-pay | Admitting: Emergency Medicine

## 2013-05-28 DIAGNOSIS — E039 Hypothyroidism, unspecified: Secondary | ICD-10-CM | POA: Diagnosis present

## 2013-05-28 DIAGNOSIS — I6521 Occlusion and stenosis of right carotid artery: Secondary | ICD-10-CM

## 2013-05-28 DIAGNOSIS — M129 Arthropathy, unspecified: Secondary | ICD-10-CM | POA: Diagnosis present

## 2013-05-28 DIAGNOSIS — F3289 Other specified depressive episodes: Secondary | ICD-10-CM | POA: Diagnosis present

## 2013-05-28 DIAGNOSIS — Z8249 Family history of ischemic heart disease and other diseases of the circulatory system: Secondary | ICD-10-CM | POA: Diagnosis not present

## 2013-05-28 DIAGNOSIS — F329 Major depressive disorder, single episode, unspecified: Secondary | ICD-10-CM | POA: Diagnosis present

## 2013-05-28 DIAGNOSIS — J45909 Unspecified asthma, uncomplicated: Secondary | ICD-10-CM | POA: Diagnosis present

## 2013-05-28 DIAGNOSIS — Z7982 Long term (current) use of aspirin: Secondary | ICD-10-CM

## 2013-05-28 DIAGNOSIS — Z8673 Personal history of transient ischemic attack (TIA), and cerebral infarction without residual deficits: Secondary | ICD-10-CM | POA: Diagnosis not present

## 2013-05-28 DIAGNOSIS — R6889 Other general symptoms and signs: Secondary | ICD-10-CM | POA: Diagnosis not present

## 2013-05-28 DIAGNOSIS — Z79899 Other long term (current) drug therapy: Secondary | ICD-10-CM | POA: Diagnosis not present

## 2013-05-28 DIAGNOSIS — R070 Pain in throat: Secondary | ICD-10-CM

## 2013-05-28 DIAGNOSIS — M542 Cervicalgia: Secondary | ICD-10-CM | POA: Diagnosis present

## 2013-05-28 DIAGNOSIS — I10 Essential (primary) hypertension: Secondary | ICD-10-CM | POA: Diagnosis present

## 2013-05-28 DIAGNOSIS — R490 Dysphonia: Secondary | ICD-10-CM

## 2013-05-28 DIAGNOSIS — I6529 Occlusion and stenosis of unspecified carotid artery: Secondary | ICD-10-CM | POA: Diagnosis not present

## 2013-05-28 LAB — POCT I-STAT, CHEM 8
BUN: 19 mg/dL (ref 6–23)
CALCIUM ION: 1.23 mmol/L (ref 1.13–1.30)
CHLORIDE: 104 meq/L (ref 96–112)
Creatinine, Ser: 0.8 mg/dL (ref 0.50–1.10)
Glucose, Bld: 90 mg/dL (ref 70–99)
HCT: 41 % (ref 36.0–46.0)
Hemoglobin: 13.9 g/dL (ref 12.0–15.0)
Potassium: 4 mEq/L (ref 3.7–5.3)
Sodium: 143 mEq/L (ref 137–147)
TCO2: 27 mmol/L (ref 0–100)

## 2013-05-28 LAB — CBC WITH DIFFERENTIAL/PLATELET
BASOS ABS: 0.1 10*3/uL (ref 0.0–0.1)
Basophils Relative: 1 % (ref 0–1)
Eosinophils Absolute: 0.3 10*3/uL (ref 0.0–0.7)
Eosinophils Relative: 3 % (ref 0–5)
HEMATOCRIT: 39.5 % (ref 36.0–46.0)
Hemoglobin: 12.4 g/dL (ref 12.0–15.0)
LYMPHS PCT: 29 % (ref 12–46)
Lymphs Abs: 2.3 10*3/uL (ref 0.7–4.0)
MCH: 28.7 pg (ref 26.0–34.0)
MCHC: 31.4 g/dL (ref 30.0–36.0)
MCV: 91.4 fL (ref 78.0–100.0)
MONO ABS: 0.7 10*3/uL (ref 0.1–1.0)
Monocytes Relative: 9 % (ref 3–12)
Neutro Abs: 4.6 10*3/uL (ref 1.7–7.7)
Neutrophils Relative %: 58 % (ref 43–77)
PLATELETS: 352 10*3/uL (ref 150–400)
RBC: 4.32 MIL/uL (ref 3.87–5.11)
RDW: 12.9 % (ref 11.5–15.5)
WBC: 7.9 10*3/uL (ref 4.0–10.5)

## 2013-05-28 LAB — RAPID STREP SCREEN (MED CTR MEBANE ONLY): STREPTOCOCCUS, GROUP A SCREEN (DIRECT): NEGATIVE

## 2013-05-28 MED ORDER — LOSARTAN POTASSIUM 50 MG PO TABS
50.0000 mg | ORAL_TABLET | Freq: Every day | ORAL | Status: DC
  Filled 2013-05-28: qty 1

## 2013-05-28 MED ORDER — HYDRALAZINE HCL 20 MG/ML IJ SOLN
10.0000 mg | INTRAMUSCULAR | Status: DC | PRN
  Filled 2013-05-28: qty 0.5

## 2013-05-28 MED ORDER — ACETAMINOPHEN 325 MG RE SUPP
325.0000 mg | RECTAL | Status: DC | PRN
  Filled 2013-05-28: qty 2

## 2013-05-28 MED ORDER — IOHEXOL 300 MG/ML  SOLN
100.0000 mL | Freq: Once | INTRAMUSCULAR | Status: AC | PRN
  Administered 2013-05-28: 100 mL via INTRAVENOUS

## 2013-05-28 MED ORDER — OXYCODONE-ACETAMINOPHEN 5-325 MG PO TABS: 1.0000 | ORAL_TABLET | ORAL | Status: DC | PRN

## 2013-05-28 MED ORDER — METOPROLOL TARTRATE 1 MG/ML IV SOLN: 2.0000 mg | INTRAVENOUS | Status: DC | PRN

## 2013-05-28 MED ORDER — ONDANSETRON HCL 4 MG/2ML IJ SOLN
4.0000 mg | Freq: Four times a day (QID) | INTRAMUSCULAR | Status: DC | PRN
Start: 2013-05-28 — End: 2013-05-29

## 2013-05-28 MED ORDER — ASPIRIN EC 81 MG PO TBEC: 81.0000 mg | DELAYED_RELEASE_TABLET | ORAL | Status: DC

## 2013-05-28 MED ORDER — DEXTROSE-NACL 5-0.45 % IV SOLN: INTRAVENOUS | Status: DC

## 2013-05-28 MED ORDER — ALUM & MAG HYDROXIDE-SIMETH 200-200-20 MG/5ML PO SUSP: 15.0000 mL | ORAL | Status: DC | PRN

## 2013-05-28 MED ORDER — ALBUTEROL SULFATE (2.5 MG/3ML) 0.083% IN NEBU: 2.5000 mg | INHALATION_SOLUTION | Freq: Four times a day (QID) | RESPIRATORY_TRACT | Status: DC | PRN

## 2013-05-28 MED ORDER — HYDROMORPHONE HCL PF 1 MG/ML IJ SOLN
1.0000 mg | INTRAMUSCULAR | Status: DC | PRN
  Administered 2013-05-28: 1 mg via INTRAVENOUS
  Filled 2013-05-28: qty 1

## 2013-05-28 MED ORDER — LABETALOL HCL 5 MG/ML IV SOLN
10.0000 mg | INTRAVENOUS | Status: DC | PRN
Start: 2013-05-28 — End: 2013-05-29
  Filled 2013-05-28: qty 4

## 2013-05-28 MED ORDER — MORPHINE SULFATE 2 MG/ML IJ SOLN: 2.0000 mg | INTRAMUSCULAR | Status: DC | PRN

## 2013-05-28 MED ORDER — SODIUM CHLORIDE 0.9 % IV SOLN: INTRAVENOUS | Status: AC

## 2013-05-28 MED ORDER — LEVOTHYROXINE SODIUM 75 MCG PO TABS
75.0000 ug | ORAL_TABLET | Freq: Every day | ORAL | Status: DC
  Filled 2013-05-28 (×2): qty 1

## 2013-05-28 MED ORDER — MIDAZOLAM HCL 2 MG/2ML IJ SOLN
INTRAMUSCULAR | Status: DC | PRN
  Administered 2013-05-28: 0.5 mg via INTRAVENOUS
  Administered 2013-05-28: 1 mg via INTRAVENOUS

## 2013-05-28 MED ORDER — SODIUM CHLORIDE 0.9 % IV SOLN
INTRAVENOUS | Status: DC
  Administered 2013-05-28: 21:00:00 via INTRAVENOUS

## 2013-05-28 MED ORDER — IOHEXOL 300 MG/ML  SOLN
150.0000 mL | Freq: Once | INTRAMUSCULAR | Status: AC | PRN
  Administered 2013-05-28: 80 mL via INTRAVENOUS

## 2013-05-28 MED ORDER — POTASSIUM CHLORIDE CRYS ER 20 MEQ PO TBCR: 20.0000 meq | EXTENDED_RELEASE_TABLET | Freq: Once | ORAL | Status: DC

## 2013-05-28 MED ORDER — FENTANYL CITRATE 0.05 MG/ML IJ SOLN
INTRAMUSCULAR | Status: DC | PRN
  Administered 2013-05-28: 25 ug via INTRAVENOUS
  Administered 2013-05-28: 12.5 ug via INTRAVENOUS

## 2013-05-28 MED ORDER — DOCUSATE SODIUM 100 MG PO CAPS
100.0000 mg | ORAL_CAPSULE | Freq: Two times a day (BID) | ORAL | Status: DC
  Administered 2013-05-28: 100 mg via ORAL
  Filled 2013-05-28 (×2): qty 1

## 2013-05-28 MED ORDER — PROPRANOLOL HCL ER 80 MG PO CP24
80.0000 mg | ORAL_CAPSULE | Freq: Every day | ORAL | Status: DC
  Filled 2013-05-28: qty 1

## 2013-05-28 MED ORDER — HEPARIN SOD (PORK) LOCK FLUSH 100 UNIT/ML IV SOLN
INTRAVENOUS | Status: DC | PRN
  Administered 2013-05-28: 1000 [IU] via INTRAVENOUS

## 2013-05-28 MED ORDER — MIDAZOLAM HCL 2 MG/2ML IJ SOLN
INTRAMUSCULAR | Status: AC
  Filled 2013-05-28: qty 2

## 2013-05-28 MED ORDER — PANTOPRAZOLE SODIUM 40 MG PO TBEC: 40.0000 mg | DELAYED_RELEASE_TABLET | Freq: Every day | ORAL | Status: DC

## 2013-05-28 MED ORDER — ONDANSETRON HCL 4 MG/2ML IJ SOLN
4.0000 mg | Freq: Three times a day (TID) | INTRAMUSCULAR | Status: DC | PRN
  Administered 2013-05-28: 4 mg via INTRAVENOUS
  Filled 2013-05-28: qty 2

## 2013-05-28 MED ORDER — VENLAFAXINE HCL 37.5 MG PO TABS
37.5000 mg | ORAL_TABLET | ORAL | Status: DC
  Filled 2013-05-28: qty 1

## 2013-05-28 MED ORDER — PHENOL 1.4 % MT LIQD
1.0000 | OROMUCOSAL | Status: DC | PRN
Start: 2013-05-28 — End: 2013-05-29
  Filled 2013-05-28: qty 177

## 2013-05-28 MED ORDER — GUAIFENESIN-DM 100-10 MG/5ML PO SYRP
15.0000 mL | ORAL_SOLUTION | ORAL | Status: DC | PRN
Start: 2013-05-28 — End: 2013-05-29

## 2013-05-28 MED ORDER — FENTANYL CITRATE 0.05 MG/ML IJ SOLN
INTRAMUSCULAR | Status: AC
  Filled 2013-05-28: qty 2

## 2013-05-28 MED ORDER — LEVOTHYROXINE SODIUM 75 MCG PO TABS: 75.0000 ug | ORAL_TABLET | Freq: Every day | ORAL | Status: DC

## 2013-05-28 MED ORDER — ACETAMINOPHEN 325 MG PO TABS: 325.0000 mg | ORAL_TABLET | ORAL | Status: DC | PRN

## 2013-05-28 MED ORDER — ALBUTEROL SULFATE HFA 108 (90 BASE) MCG/ACT IN AERS: 2.0000 | INHALATION_SPRAY | Freq: Four times a day (QID) | RESPIRATORY_TRACT | Status: DC | PRN

## 2013-05-28 MED ORDER — MORPHINE SULFATE 4 MG/ML IJ SOLN
4.0000 mg | Freq: Once | INTRAMUSCULAR | Status: AC
Start: 2013-05-28 — End: 2013-05-28
  Administered 2013-05-28: 4 mg via INTRAVENOUS
  Filled 2013-05-28: qty 1

## 2013-05-28 NOTE — ED Provider Notes (Signed)
Patient reports about 9 AM she was driving to church and had acute onset of pain in the right side of her neck and acute onset of hoarseness. She denies any difficulty breathing but states it hurts to swallow. She describes the pain as sharp.  Patient is alert and cooperative. Her voice is slightly hoarse. Her neck is palpated she has some fullness in her neck but no obvious localized swelling or masses. She is not drooling.   Medical screening examination/treatment/procedure(s) were conducted as a shared visit with non-physician practitioner(s) and myself.  I personally evaluated the patient during the encounter.  EKG Interpretation   None        Rolland Porter, MD, [facility] Sander   Janice Norrie, MD 05/28/13 1309

## 2013-05-28 NOTE — ED Notes (Signed)
Pt reports sudden onset of right neck pain this morning, 7/10, pain with swallowing, voice is slightly hoarse. Pt denies recently being sick. Pt has had surgeries on her neck, but last was in June 2014. Pt able to control saliva, denies throat swelling or difficulty breathing.

## 2013-05-28 NOTE — ED Provider Notes (Signed)
CSN: [id number]     Arrival date & time 05/28/13  1047 History   First MD Initiated Contact with Patient 05/28/13 1155     Chief Complaint  Patient presents with  . throat pain    (Consider location/radiation/quality/duration/timing/severity/associated sxs/prior Treatment) HPI  68 year old female with history of TIAs, thyroid disease, neck surgery and arthritis presents complaining of neck pain. Patient reports acute onset of sharp pain to the right side of her neck which started about 2 hours ago while she was sitting in the car going to church. Pain is persistent, worsening with swallowing and with palpation. She now noticed that her voice is slightly hoarsed. She has never experienced the symptoms before. She denies any recent sickness. Denies any fever, chills, runny nose, sneezing, cough, ear pain, throat swelling, chest pain, shortness of breath, or rash. Denies any recent trauma. States she has cervical spine fusion by Dr. Ellene Route in the past with the most recent one was last June. She is not a smoker, denies any history of cancer, denies any abnormal weight changes, night sweats, fever, or myalgias. No specific treatment tried. No complaints of numbness or weakness.   Past Medical History  Diagnosis Date  . Endometrial polyp   . TIA (transient ischemic attack)   . Hypertension   . Thyroid disease   . PONV (postoperative nausea and vomiting)     has scop patch to apply  10/13/12  . Hypothyroidism   . Pneumonia     hx  . Arthritis   . Depression     for mood swings menopause  . Asthma     hx  . Stroke     tia's   Past Surgical History  Procedure Laterality Date  . Knee surgery Right     68 yrs old  . Dilation and curettage of uterus    . Hysteroscopy    . Neck surgery  07,03    cerv,Fusion  . Rotator cuff repair Right 13  . Anterior cervical decomp/discectomy fusion N/A 10/14/2012    Procedure: ANTERIOR CERVICAL DECOMPRESSION/DISCECTOMY FUSION 1 LEVEL/HARDWARE REMOVAL;   Surgeon: Kristeen Miss, MD;  Location: [facility];  Service: Neurosurgery;  Laterality: N/A;  C7-T1 Anterior cervical decompression/diskectomy/fusion/removal of old synthes plate  . Back surgery  01  . Tonsillectomy     Family History  Problem Relation Age of Onset  . Ovarian cancer Mother   . Cancer Mother     Leukemia  . Leukemia Mother   . Hypertension Father   . Heart disease Father    History  Substance Use Topics  . Smoking status: Never Smoker   . Smokeless tobacco: Not on file  . Alcohol Use: 1.8 oz/week    3 Glasses of wine per week   OB History   Grav Para Term Preterm Abortions TAB SAB Ect Mult Living   2 2 2       2      Review of Systems  All other systems reviewed and are negative.    Allergies  Compazine  Home Medications   Current Outpatient Rx  Name  Route  Sig  Dispense  Refill  . aspirin 81 MG tablet   Oral   Take 81 mg by mouth every other day.          . Calcium Carbonate-Vitamin D (CALCIUM + D PO)   Oral   Take 1 tablet by mouth daily.          . Cholecalciferol (VITAMIN D PO)  Oral   Take 5,000 Units by mouth daily.          Marland Kitchen levothyroxine (SYNTHROID, LEVOTHROID) 75 MCG tablet   Oral   Take 75 mcg by mouth daily.         Marland Kitchen losartan (COZAAR) 50 MG tablet   Oral   Take 50 mg by mouth daily.         . Multiple Vitamin (MULTIVITAMIN) tablet   Oral   Take 1 tablet by mouth daily.         . propranolol ER (INDERAL LA) 80 MG 24 hr capsule   Oral   Take 80 mg by mouth daily.         Marland Kitchen venlafaxine (EFFEXOR) 37.5 MG tablet   Oral   Take 37.5 mg by mouth every other day.          . albuterol (PROVENTIL HFA;VENTOLIN HFA) 108 (90 BASE) MCG/ACT inhaler   Inhalation   Inhale 2 puffs into the lungs every 6 (six) hours as needed for wheezing.          BP 142/81  Pulse 62  Temp(Src) 97.7 F (36.5 C) (Oral)  Resp 14  SpO2 98% Physical Exam  Nursing note and vitals reviewed. Constitutional: She is oriented to  person, place, and time. She appears well-developed and well-nourished. No distress.  Voice is hoarsed.  HENT:  Head: Atraumatic.  Right Ear: External ear normal.  Left Ear: External ear normal.  Nose: Nose normal.  Mouth/Throat: Oropharynx is clear and moist. No oropharyngeal exudate.  Eyes: Conjunctivae are normal.  Neck: Normal range of motion. Neck supple. No JVD present. Carotid bruit is not present. No tracheal deviation present. No thyromegaly present.  Tenderness to R submental/carotid region of neck on palpation without obvious lymphadenopathy, rash, or swelling noted.    No nuchal rigidity.  No carotid bruit noted.   Lymphadenopathy:    She has no cervical adenopathy.  Neurological: She is alert and oriented to person, place, and time. No cranial nerve deficit.  Skin: No rash noted.  Well healing surgical scar to L lower anterior neck.     Psychiatric: She has a normal mood and affect.    ED Course  Procedures (including critical care time)  12:15 PM Patient with acute onset of right-sided neck pain and hoarseness. Pain is reproducible on exam. Patient appears nontoxic without any evidence of acute respiratory distress. She has no prior history of cancer to suggest left recurrent laryngeal nerve impingement causing hoarseness. Since pt has prior neck surgery, will obtain non contrast soft tissue neck ct for further evaluation along with swallow study.  Care discussed with Dr. Rolland Porter.  2:22 PM CT of demonstrate a decrease in the caliber of stool internal carotid artery which may 6 represent significant stenosis. An acute dissection is also considered. I discussed this finding promptly with the radiologist, with patient and with my attending provider. Will consult vascular surgeon for further care. Patient currently resting comfortably without any acute distress. She denies any lightheadedness or dizziness, no chest pain or shortness of breath.   2:34 PM I have spoken  with vascular surgeon, Dr. Oneida Alar who agrees to see pt for further evaluation.    4:09 PM Vascular surgeon Dr. Oneida Alar has evaluate pt and and will have pt transfer to [facility] to ward 2 W for [facility] to perform endarcterectomy for further evaluation.  Pt made NPO.  Holding order placed.  EMTALA form filled.  Pt aware of  plan   Labs Review Labs Reviewed  RAPID STREP SCREEN  CULTURE, GROUP A STREP  CBC WITH DIFFERENTIAL  POCT I-STAT, CHEM 8   Imaging Review Ct Soft Tissue Neck W Contrast  05/28/2013   CLINICAL DATA:  Acute onset of right-sided neck pain and hoarseness.  EXAM: CT NECK WITH CONTRAST  TECHNIQUE: Multidetector CT imaging of the neck was performed using the standard protocol following the bolus administration of intravenous contrast.  CONTRAST:  140mL OMNIPAQUE IOHEXOL 300 MG/ML  SOLN  COMPARISON:  None.  FINDINGS: No focal mucosal or submucosal lesions are present. Vocal cords are midline and symmetric. The thyroid is atrophic.  Limited imaging of the brain is unremarkable.  There is tapering of the proximal right internal carotid artery extending nearly 2 cm above the bifurcation. There is tortuosity and focal stenosis of the cervical right internal carotid artery beyond this point. The distal cervical ICA is smaller caliber than on the left, suggesting a significant stenosis.  Marked tortuosity is present in the left internal carotid artery with atherosclerotic irregularity near the bifurcation but no significant stenosis. The vertebral arteries are intact bilaterally. There is soft tissue surrounding the proximal right internal carotid artery within the area tapering.  No significant cervical adenopathy is present.  Extensive cervical fusion is evident from C3 through T1. Endplate degenerative changes are present at T1-2.  The lung apices are clear.  IMPRESSION: 1. Focal tapering of the right internal carotid artery with decrease caliber of the distal ICA. This may represent  carotidynia with significant stenosis. An acute dissection is also considered. 2. The vocal cords appear symmetric. An acute vascular event could be affecting function of the will vocal cords. 3. Extensive cervical spine fusion. These results were called by telephone at the time of interpretation on 05/28/2013 at 2:11 PM to [facility] , who verbally acknowledged these results.   Electronically Signed   By: Lawrence Santiago M.D.   On: 05/28/2013 14:11    EKG Interpretation   None       MDM   1. Pain in throat   2. Carotid stenosis, right    BP 142/81  Pulse 62  Temp(Src) 98.3 F (36.8 C) (Oral)  Resp 14  SpO2 98%  I have reviewed nursing notes and vital signs. I personally reviewed the imaging tests through PACS system  I reviewed available ER/hospitalization records thought the EMR     Domenic Moras, [place] 05/28/13 1611

## 2013-05-28 NOTE — Procedures (Addendum)
S/P 4 vessel cerebral arteriogram. RT CFA approach  Findings. 1.tortuous prox RT ICA . No intimal flaps or stenosis or intraluminal defects seen. 2.Minimal FMD like changes mid cervical LT ICA. 3.Ectatic prominent Rt IJV in the cervical region

## 2013-05-28 NOTE — Consult Note (Signed)
[facility] [place] HISTORY AND PHYSICAL   History of Present Illness:  Patient is a 68 y.o. year old female who presents for evaluation of right neck pain with acute onset of hoarsness.  Pt was driving to church earlier today when she had acute onset of pain in the base of the right neck.  Shortly after the onset of pain she developed hoarseness.  The pain and hoarseness have persisted. She was evaluated by CT scan of the neck which showed "possible narrowing of the distal ICA with possible dissection."  She denies any numbness or weakness of the extremities.  She has had no visual changes.  The pain is constant and not associated with swallowing.  She denies fever or chills.  She has not had any recent respiratory illness.  She denies any trauma over the last several weeks.  She does have a history of hypertension and this has been poorly controlled recently and a second medication was added.  She denies history of smoking or elevated cholesterol.  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  She had the last episode 3 months ago.  She had an evaluation by Dr Leonie Man from neuro in the past for her TIAs and was told she had a " blockage in the back of her head and take an aspirin if she has TIA symptoms.  Today she has had none of these symptoms.  Dr Clydene Fake records unavailable for review today.  She denies history of connective tissue disorder.  She has had multiple prior cspine operations for degenerative bone disease .  Other medical problems include depression, asthma, multiple degenerative joints all of these problems are currently stable.  No family history of connective tissue problems.  Her father did have an MI at age 42.  Past Medical History  Diagnosis Date  . Endometrial polyp   . TIA (transient ischemic attack)   . Hypertension   . Thyroid disease   . PONV (postoperative nausea and vomiting)     has scop patch to apply   10/13/12  . Hypothyroidism   . Pneumonia     hx  . Arthritis   . Depression     for mood swings menopause  . Asthma     hx  . Stroke     tia's    Past Surgical History  Procedure Laterality Date  . Knee surgery Right     68 yrs old  . Dilation and curettage of uterus    . Hysteroscopy    . Neck surgery  07,03    cerv,Fusion  . Rotator cuff repair Right 13  . Anterior cervical decomp/discectomy fusion N/A 10/14/2012    Procedure: ANTERIOR CERVICAL DECOMPRESSION/DISCECTOMY FUSION 1 LEVEL/HARDWARE REMOVAL;  Surgeon: Kristeen Miss, MD;  Location: [facility];  Service: Neurosurgery;  Laterality: N/A;  C7-T1 Anterior cervical decompression/diskectomy/fusion/removal of old synthes plate  . Back surgery  01  . Tonsillectomy      Social History History  Substance Use Topics  . Smoking status: Never Smoker   . Smokeless tobacco: Not on file  . Alcohol Use: 1.8 oz/week    3 Glasses of wine per week    Family History Family History  Problem Relation Age of Onset  . Ovarian cancer Mother   . Cancer Mother     Leukemia  . Leukemia Mother   . Hypertension Father   . Heart disease Father  Allergies  Allergies  Allergen Reactions  . Compazine [Prochlorperazine Edisylate] Swelling    Mouth and tongue swell, throat swells closed     No current facility-administered medications for this encounter.   Current Outpatient Prescriptions  Medication Sig Dispense Refill  . aspirin 81 MG tablet Take 81 mg by mouth every other day.       . Calcium Carbonate-Vitamin D (CALCIUM + D PO) Take 1 tablet by mouth daily.       . Cholecalciferol (VITAMIN D PO) Take 5,000 Units by mouth daily.       Marland Kitchen levothyroxine (SYNTHROID, LEVOTHROID) 75 MCG tablet Take 75 mcg by mouth daily.      Marland Kitchen losartan (COZAAR) 50 MG tablet Take 50 mg by mouth daily.      . Multiple Vitamin (MULTIVITAMIN) tablet Take 1 tablet by mouth daily.      . propranolol ER (INDERAL LA) 80 MG 24 hr capsule Take 80 mg by  mouth daily.      Marland Kitchen venlafaxine (EFFEXOR) 37.5 MG tablet Take 37.5 mg by mouth every other day.       . albuterol (PROVENTIL HFA;VENTOLIN HFA) 108 (90 BASE) MCG/ACT inhaler Inhale 2 puffs into the lungs every 6 (six) hours as needed for wheezing.        ROS:   General:  No weight loss, Fever, chills  HEENT: No recent headaches, no nasal bleeding, no visual changes, no sore throat  Neurologic: No dizziness, blackouts, seizures.  No recent episodes of slurred speech, or temporary blindness.  Cardiac: No recent episodes of chest pain/pressure, no shortness of breath at rest.  No shortness of breath with exertion.  Denies history of atrial fibrillation or irregular heartbeat  Vascular: No history of rest pain in feet.  No history of claudication.  No history of non-healing ulcer, No history of DVT   Pulmonary: No home oxygen, no productive cough, no hemoptysis  Musculoskeletal:  [ x] Arthritis, [x ] Low back pain,  [ x] Joint pain  Hematologic:No history of hypercoagulable state.  No history of easy bleeding.  No history of anemia  Gastrointestinal: No hematochezia or melena,  No gastroesophageal reflux, no trouble swallowing  Urinary: [ ]  chronic Kidney disease, [ ]  on HD - [ ]  MWF or [ ]  TTHS, [ ]  Burning with urination, [ ]  Frequent urination, [ ]  Difficulty urinating;   Skin: No rashes  Psychological: + history of anxiety,  + history of depression   Physical Examination  Filed Vitals:   05/28/13 1136 05/28/13 1234  BP: 142/81   Pulse: 62   Temp: 97.7 F (36.5 C) 98.3 F (36.8 C)  TempSrc: Oral   Resp: 14   SpO2: 98%     There is no weight on file to calculate BMI.  General:  Alert and oriented, no acute distress HEENT: Normal Neck: No bruit or JVD Pulmonary: Clear to auscultation bilaterally Cardiac: Regular Rate and Rhythm without murmur Abdomen: Soft, non-tender, non-distended, no mass Skin: No rash Extremity Pulses:  2+ radial, brachial, carotid pulses,  minimal neck tenderness on palpation, no obvious mass, Musculoskeletal: No edema  Neurologic: Upper and lower extremity motor 5/5 and symmetric, face symmetric, EOMI, PERRL, oropharynx symmetric no difficulty swallowing  DATA:   CBC    Component Value Date/Time   WBC 14.4* 03/10/2013 0445   RBC 2.60* 03/10/2013 0445   HGB 13.9 05/28/2013 1317   HCT 41.0 05/28/2013 1317   PLT 219 03/10/2013 0445   MCV 93.1 03/10/2013  0445   MCH 30.0 03/10/2013 0445   MCHC 32.2 03/10/2013 0445   RDW 13.8 03/10/2013 0445   LYMPHSABS 3.0 03/06/2013 0737   MONOABS 0.8 03/06/2013 0737   EOSABS 0.2 03/06/2013 0737   BASOSABS 0.1 03/06/2013 0737     BMET    Component Value Date/Time   NA 143 05/28/2013 1317   K 4.0 05/28/2013 1317   CL 104 05/28/2013 1317   CO2 31 03/07/2013 0540   GLUCOSE 90 05/28/2013 1317   BUN 19 05/28/2013 1317   CREATININE 0.80 05/28/2013 1317   CALCIUM 8.2* 03/07/2013 0540   GFRNONAA 87* 03/07/2013 0540   GFRAA >90 03/07/2013 0540    CT neck: Tapering of the proximal right internal carotid artery  extending nearly 2 cm above the bifurcation. Focal stenosis of the cervical right internal carotid The distal cervical ICA is smaller caliber.Soft tissue surrounding the proximal right internal carotid  artery within the area tapering.Marked tortuosity is present in the left internal carotid artery  with atherosclerotic irregularity near the bifurcation but no  significant stenosis. The vertebral arteries are intact bilaterally.  ASSESSMENT:  Right neck pain. Could be explained if this is a carotid dissection but if this is run of the mill atherosclerosis or FMD it would be very unusual to have pain from the carotid artery.  Best way to rule out dissection would be carotid angio and I have discussed this with the patient.  Dr Estanislado Pandy contacted to do the study.  Pt will need transfer to [facility].  He prefers to hold on heparin until after the study.  If the study is negative will need to  consider ENT types of differential diagnosis.   PLAN:  See above  Ruta Hinds, MD [facility] [place] Office: [phone] Pager: [phone]

## 2013-05-28 NOTE — ED Provider Notes (Signed)
See prior note   Janice Norrie, MD 05/28/13 [phone]

## 2013-05-29 DIAGNOSIS — R490 Dysphonia: Secondary | ICD-10-CM

## 2013-05-29 DIAGNOSIS — M542 Cervicalgia: Secondary | ICD-10-CM

## 2013-05-29 NOTE — Discharge Summary (Signed)
[facility] Discharge Summary  Jeanette [name] 1946/04/12 68 y.o. female  [id number]  Admission Date: 05/28/2013  Discharge Date: 05/29/13  Physician: Elam Dutch, MD  Admission Diagnosis: Pain in throat [784.1] Carotid stenosis, right [433.10]   HPI:   This is a 68 y.o. female who presents for evaluation of right neck pain with acute onset of hoarsness. Pt was driving to church earlier today when she had acute onset of pain in the base of the right neck. Shortly after the onset of pain she developed hoarseness. The pain and hoarseness have persisted. She was evaluated by CT scan of the neck which showed "possible narrowing of the distal ICA with possible dissection." She denies any numbness or weakness of the extremities. She has had no visual changes. The pain is constant and not associated with swallowing. She denies fever or chills. She has not had any recent respiratory illness. She denies any trauma over the last several weeks. She does have a history of hypertension and this has been poorly controlled recently and a second medication was added. She denies history of smoking or elevated cholesterol. 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 She had the last episode 3 months ago. She had an evaluation by Dr Leonie Man from neuro in the past for her TIAs and was told she had a " blockage in the back of her head and take an aspirin if she has TIA symptoms. Today she has had none of these symptoms. Dr Clydene Fake records unavailable for review today. She denies history of connective tissue disorder. She has had multiple prior cspine operations for degenerative bone disease . Other medical problems include depression, asthma, multiple degenerative joints all of these problems are currently stable. No family history of connective tissue problems. Her father did have an MI at age 92.  Hospital Course:  The patient was admitted  to the hospital and taken to interventional radiology and underwent: A cerebral angiogram was performed and findings are as follows:   1.tortuous prox RT ICA .  No intimal flaps or stenosis or intraluminal defects seen.  2.Minimal FMD like changes mid cervical LT ICA.  3.Ectatic prominent Rt IJV in the cervical region  The pt tolerated the procedure well and was transported to the PACU in good condition.   By the next morning, she still has some hoarseness and right neck pain is improved.  There is no groin hematoma and UE and LE motor and sensation are intact.  She will f/u with her PCP as needed for her sore throat.  The remainder of the hospital course consisted of increasing mobilization and increasing intake of solids without difficulty.  CBC    Component Value Date/Time   WBC 7.9 05/28/2013 1310   RBC 4.32 05/28/2013 1310   HGB 13.9 05/28/2013 1317   HCT 41.0 05/28/2013 1317   PLT 352 05/28/2013 1310   MCV 91.4 05/28/2013 1310   MCH 28.7 05/28/2013 1310   MCHC 31.4 05/28/2013 1310   RDW 12.9 05/28/2013 1310   LYMPHSABS 2.3 05/28/2013 1310   MONOABS 0.7 05/28/2013 1310   EOSABS 0.3 05/28/2013 1310   BASOSABS 0.1 05/28/2013 1310    BMET    Component Value Date/Time   NA 143 05/28/2013 1317   K 4.0 05/28/2013 1317   CL 104 05/28/2013 1317   CO2 31 03/07/2013 0540   GLUCOSE 90 05/28/2013 1317   BUN 19 05/28/2013 1317  CREATININE 0.80 05/28/2013 1317   CALCIUM 8.2* 03/07/2013 0540   GFRNONAA 87* 03/07/2013 0540   GFRAA >90 03/07/2013 0540     Discharge Instructions:   The patient is discharged to home with extensive instructions on wound care and progressive ambulation.  They are instructed not to drive or perform any heavy lifting until returning to see the physician in his office.  Discharge Orders   Future Orders Complete By Expires   Call MD for:  redness, tenderness, or signs of infection (pain, swelling, bleeding, redness, odor or green/yellow discharge around incision  site)  As directed    Call MD for:  severe or increased pain, loss or decreased feeling  in affected limb(s)  As directed    Call MD for:  temperature >100.5  As directed    Discharge wound care:  As directed    Comments:     Shower daily with soap and water starting today   Lifting restrictions  As directed    Comments:     No lifting for 48 hours   Resume previous diet  As directed       Discharge Diagnosis:  Pain in throat [784.1] Carotid stenosis, right [433.10]  Secondary Diagnosis: Patient Active Problem List   Diagnosis Date Noted  . Carotid stenosis, right 05/28/2013  . Neck pain on right side 05/28/2013  . Cervical spondylosis without myelopathy 10/14/2012  . Hypertension   . Thyroid disease   . Endometrial polyp   . TIA (transient ischemic attack)    Past Medical History  Diagnosis Date  . Endometrial polyp   . TIA (transient ischemic attack)   . Hypertension   . Thyroid disease   . PONV (postoperative nausea and vomiting)     has scop patch to apply  10/13/12  . Hypothyroidism   . Pneumonia     hx  . Arthritis   . Depression     for mood swings menopause  . Asthma     hx  . Stroke     tia's       Medication List         albuterol 108 (90 BASE) MCG/ACT inhaler  Commonly known as:  PROVENTIL HFA;VENTOLIN HFA  Inhale 2 puffs into the lungs every 6 (six) hours as needed for wheezing.     aspirin 81 MG tablet  Take 81 mg by mouth every other day.     CALCIUM + D PO  Take 1 tablet by mouth daily.     levothyroxine 75 MCG tablet  Commonly known as:  SYNTHROID, LEVOTHROID  Take 75 mcg by mouth daily.     losartan 50 MG tablet  Commonly known as:  COZAAR  Take 50 mg by mouth daily.     multivitamin tablet  Take 1 tablet by mouth daily.     propranolol ER 80 MG 24 hr capsule  Commonly known as:  INDERAL LA  Take 80 mg by mouth daily.     venlafaxine 37.5 MG tablet  Commonly known as:  EFFEXOR  Take 37.5 mg by mouth every other day.      VITAMIN D PO  Take 5,000 Units by mouth daily.        No pain medication given  Disposition: home  Patient's condition: is Good  Follow up: 1. Dr. Minna Antis as needed   Leontine Locket, PA-C [facility] [phone] 05/29/2013  7:36 AM

## 2013-05-29 NOTE — Progress Notes (Signed)
Still some hoarseness, right neck pain improved.  Filed Vitals:   05/28/13 2235 05/28/13 2335 05/29/13 0035 05/29/13 0440  BP: 112/60 106/61 100/60 112/70  Pulse: 64 63 63 66  Temp:    97.7 F (36.5 C)  TempSrc:    Oral  Resp: 16 15 16 17   Height:      Weight:      SpO2: 95% 98% 98% 93%    Right groin no hematoma Neuro UE/LE 5/5 motor sensation  Intact  Ruta Hinds, MD [facility] [place] Office: [phone] Pager: [phone]

## 2013-05-30 LAB — CULTURE, GROUP A STREP

## 2013-08-01 DIAGNOSIS — S43429A Sprain of unspecified rotator cuff capsule, initial encounter: Secondary | ICD-10-CM | POA: Diagnosis not present

## 2013-08-17 DIAGNOSIS — M545 Low back pain, unspecified: Secondary | ICD-10-CM | POA: Diagnosis not present

## 2013-08-17 DIAGNOSIS — M48061 Spinal stenosis, lumbar region without neurogenic claudication: Secondary | ICD-10-CM | POA: Diagnosis not present

## 2013-08-17 DIAGNOSIS — Z6828 Body mass index (BMI) 28.0-28.9, adult: Secondary | ICD-10-CM | POA: Diagnosis not present

## 2013-09-19 ENCOUNTER — Other Ambulatory Visit: Payer: Self-pay

## 2013-09-19 DIAGNOSIS — D2330 Other benign neoplasm of skin of unspecified part of face: Secondary | ICD-10-CM | POA: Diagnosis not present

## 2013-09-19 DIAGNOSIS — L570 Actinic keratosis: Secondary | ICD-10-CM | POA: Diagnosis not present

## 2013-09-19 DIAGNOSIS — D1801 Hemangioma of skin and subcutaneous tissue: Secondary | ICD-10-CM | POA: Diagnosis not present

## 2013-09-19 DIAGNOSIS — Z85828 Personal history of other malignant neoplasm of skin: Secondary | ICD-10-CM | POA: Diagnosis not present

## 2013-09-19 DIAGNOSIS — D239 Other benign neoplasm of skin, unspecified: Secondary | ICD-10-CM | POA: Diagnosis not present

## 2013-09-19 DIAGNOSIS — L820 Inflamed seborrheic keratosis: Secondary | ICD-10-CM | POA: Diagnosis not present

## 2013-09-19 DIAGNOSIS — L821 Other seborrheic keratosis: Secondary | ICD-10-CM | POA: Diagnosis not present

## 2013-09-19 DIAGNOSIS — D485 Neoplasm of uncertain behavior of skin: Secondary | ICD-10-CM | POA: Diagnosis not present

## 2013-10-03 DIAGNOSIS — Z1231 Encounter for screening mammogram for malignant neoplasm of breast: Secondary | ICD-10-CM | POA: Diagnosis not present

## 2013-10-10 DIAGNOSIS — J029 Acute pharyngitis, unspecified: Secondary | ICD-10-CM | POA: Diagnosis not present

## 2013-10-18 DIAGNOSIS — H259 Unspecified age-related cataract: Secondary | ICD-10-CM | POA: Diagnosis not present

## 2013-11-01 DIAGNOSIS — I10 Essential (primary) hypertension: Secondary | ICD-10-CM | POA: Diagnosis not present

## 2013-11-01 DIAGNOSIS — E559 Vitamin D deficiency, unspecified: Secondary | ICD-10-CM | POA: Diagnosis not present

## 2013-11-07 DIAGNOSIS — E039 Hypothyroidism, unspecified: Secondary | ICD-10-CM | POA: Diagnosis not present

## 2013-11-07 DIAGNOSIS — Z23 Encounter for immunization: Secondary | ICD-10-CM | POA: Diagnosis not present

## 2013-11-07 DIAGNOSIS — I10 Essential (primary) hypertension: Secondary | ICD-10-CM | POA: Diagnosis not present

## 2013-11-07 DIAGNOSIS — Z0000 Encounter for general adult medical examination without abnormal findings: Secondary | ICD-10-CM | POA: Diagnosis not present

## 2013-12-07 DIAGNOSIS — E039 Hypothyroidism, unspecified: Secondary | ICD-10-CM | POA: Diagnosis not present

## 2014-02-14 DIAGNOSIS — Z6827 Body mass index (BMI) 27.0-27.9, adult: Secondary | ICD-10-CM | POA: Diagnosis not present

## 2014-02-14 DIAGNOSIS — M4806 Spinal stenosis, lumbar region: Secondary | ICD-10-CM | POA: Diagnosis not present

## 2014-02-14 DIAGNOSIS — Z23 Encounter for immunization: Secondary | ICD-10-CM | POA: Diagnosis not present

## 2014-02-14 DIAGNOSIS — M4326 Fusion of spine, lumbar region: Secondary | ICD-10-CM | POA: Diagnosis not present

## 2014-02-16 DIAGNOSIS — R0981 Nasal congestion: Secondary | ICD-10-CM | POA: Diagnosis not present

## 2014-02-16 DIAGNOSIS — J0110 Acute frontal sinusitis, unspecified: Secondary | ICD-10-CM | POA: Diagnosis not present

## 2014-02-16 DIAGNOSIS — J4521 Mild intermittent asthma with (acute) exacerbation: Secondary | ICD-10-CM | POA: Diagnosis not present

## 2014-02-20 DIAGNOSIS — R0602 Shortness of breath: Secondary | ICD-10-CM | POA: Diagnosis not present

## 2014-02-20 DIAGNOSIS — R918 Other nonspecific abnormal finding of lung field: Secondary | ICD-10-CM | POA: Diagnosis not present

## 2014-03-12 ENCOUNTER — Encounter (HOSPITAL_COMMUNITY): Payer: Self-pay | Admitting: Emergency Medicine

## 2014-05-02 DIAGNOSIS — E039 Hypothyroidism, unspecified: Secondary | ICD-10-CM | POA: Diagnosis not present

## 2014-05-02 DIAGNOSIS — I10 Essential (primary) hypertension: Secondary | ICD-10-CM | POA: Diagnosis not present

## 2014-05-09 DIAGNOSIS — I10 Essential (primary) hypertension: Secondary | ICD-10-CM | POA: Diagnosis not present

## 2014-05-09 DIAGNOSIS — Z23 Encounter for immunization: Secondary | ICD-10-CM | POA: Diagnosis not present

## 2014-05-09 DIAGNOSIS — Z1389 Encounter for screening for other disorder: Secondary | ICD-10-CM | POA: Diagnosis not present

## 2014-05-09 DIAGNOSIS — Z09 Encounter for follow-up examination after completed treatment for conditions other than malignant neoplasm: Secondary | ICD-10-CM | POA: Diagnosis not present

## 2014-06-14 DIAGNOSIS — M47812 Spondylosis without myelopathy or radiculopathy, cervical region: Secondary | ICD-10-CM | POA: Diagnosis not present

## 2014-06-14 DIAGNOSIS — M62838 Other muscle spasm: Secondary | ICD-10-CM | POA: Diagnosis not present

## 2014-06-21 DIAGNOSIS — M62838 Other muscle spasm: Secondary | ICD-10-CM | POA: Diagnosis not present

## 2014-07-01 IMAGING — CR DG CHEST 2V
2 series · 2 of 2 positions shown · non-contrast
Comparison: 08/12/2012

CLINICAL DATA: Preadmission radiograph

CHEST - 2 VIEW

[view not recorded (1 of 2)]
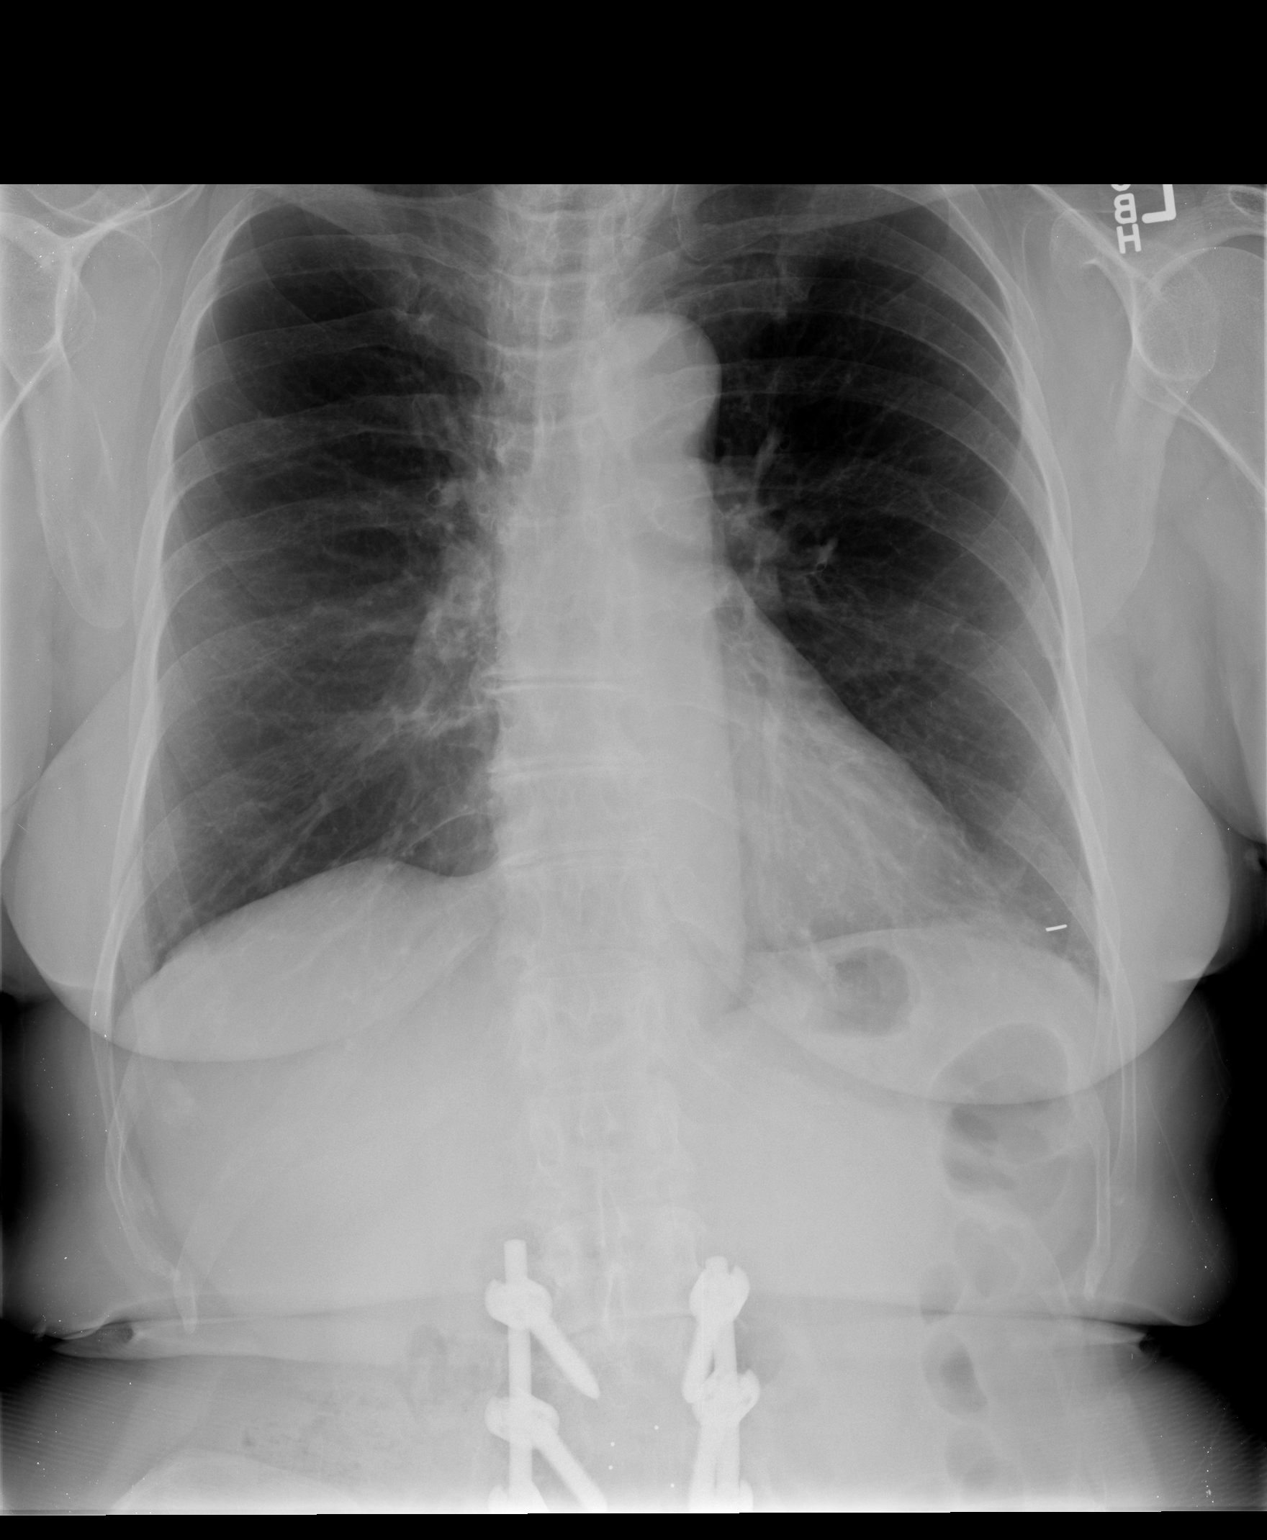

[view not recorded (2 of 2)]
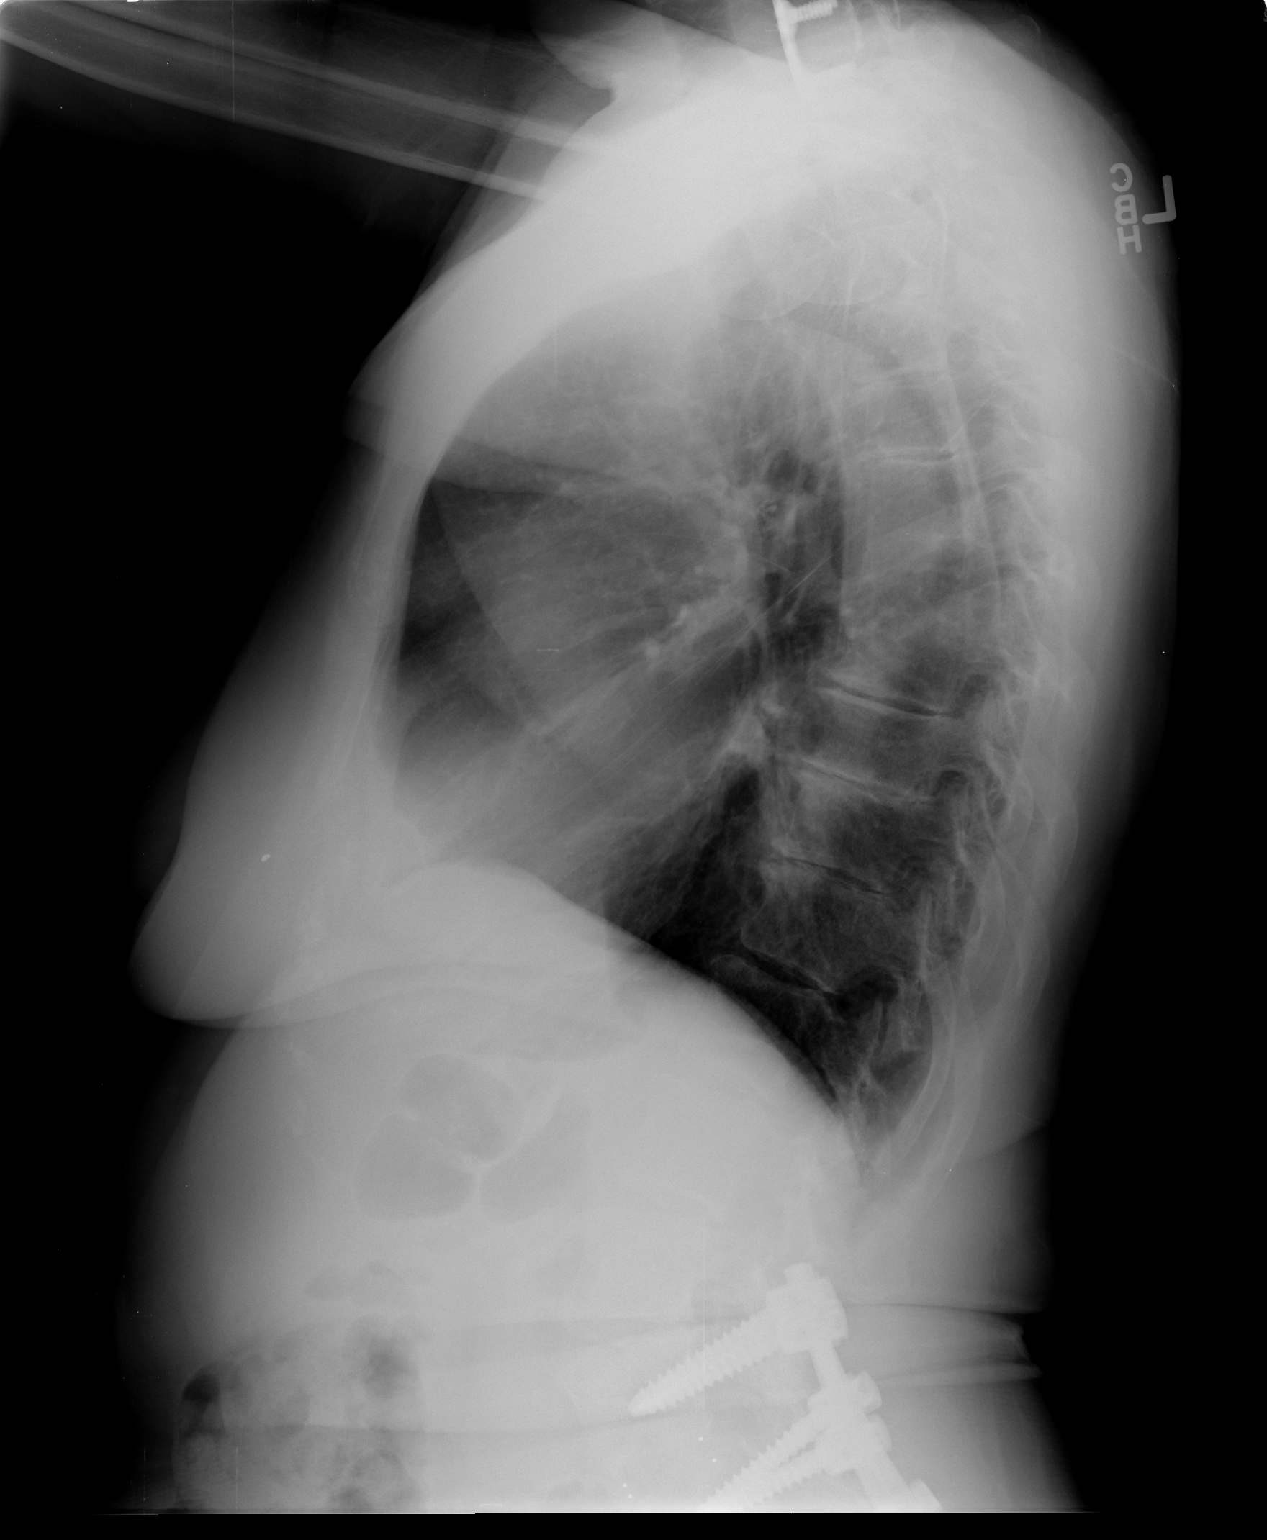

[2 of 2 positions shown; findings below may reference images not displayed]

FINDINGS: Heart size is normal.  There is no pleural effusion or
edema identified.  No airspace consolidation noted.  Spondylosis
identified within the thoracic spine.  Postsurgical changes from
cervical and lumbar spine fusion noted.
IMPRESSION: 1.  No acute cardiopulmonary abnormalities.

## 2014-07-24 DIAGNOSIS — J45909 Unspecified asthma, uncomplicated: Secondary | ICD-10-CM | POA: Diagnosis not present

## 2014-08-14 DIAGNOSIS — L309 Dermatitis, unspecified: Secondary | ICD-10-CM | POA: Diagnosis not present

## 2014-09-18 DIAGNOSIS — D485 Neoplasm of uncertain behavior of skin: Secondary | ICD-10-CM | POA: Diagnosis not present

## 2014-09-18 DIAGNOSIS — L814 Other melanin hyperpigmentation: Secondary | ICD-10-CM | POA: Diagnosis not present

## 2014-09-18 DIAGNOSIS — D692 Other nonthrombocytopenic purpura: Secondary | ICD-10-CM | POA: Diagnosis not present

## 2014-09-18 DIAGNOSIS — C44319 Basal cell carcinoma of skin of other parts of face: Secondary | ICD-10-CM | POA: Diagnosis not present

## 2014-09-18 DIAGNOSIS — C4441 Basal cell carcinoma of skin of scalp and neck: Secondary | ICD-10-CM | POA: Diagnosis not present

## 2014-09-18 DIAGNOSIS — L570 Actinic keratosis: Secondary | ICD-10-CM | POA: Diagnosis not present

## 2014-09-18 DIAGNOSIS — L718 Other rosacea: Secondary | ICD-10-CM | POA: Diagnosis not present

## 2014-09-18 DIAGNOSIS — L820 Inflamed seborrheic keratosis: Secondary | ICD-10-CM | POA: Diagnosis not present

## 2014-09-18 DIAGNOSIS — Z85828 Personal history of other malignant neoplasm of skin: Secondary | ICD-10-CM | POA: Diagnosis not present

## 2014-09-18 DIAGNOSIS — D2239 Melanocytic nevi of other parts of face: Secondary | ICD-10-CM | POA: Diagnosis not present

## 2014-09-18 DIAGNOSIS — L918 Other hypertrophic disorders of the skin: Secondary | ICD-10-CM | POA: Diagnosis not present

## 2014-09-18 DIAGNOSIS — L821 Other seborrheic keratosis: Secondary | ICD-10-CM | POA: Diagnosis not present

## 2014-09-26 DIAGNOSIS — Z85828 Personal history of other malignant neoplasm of skin: Secondary | ICD-10-CM | POA: Diagnosis not present

## 2014-09-26 DIAGNOSIS — C44319 Basal cell carcinoma of skin of other parts of face: Secondary | ICD-10-CM | POA: Diagnosis not present

## 2014-10-02 DIAGNOSIS — Z4802 Encounter for removal of sutures: Secondary | ICD-10-CM | POA: Diagnosis not present

## 2014-10-04 DIAGNOSIS — M542 Cervicalgia: Secondary | ICD-10-CM | POA: Diagnosis not present

## 2014-10-04 DIAGNOSIS — M5481 Occipital neuralgia: Secondary | ICD-10-CM | POA: Diagnosis not present

## 2014-10-25 DIAGNOSIS — S83281A Other tear of lateral meniscus, current injury, right knee, initial encounter: Secondary | ICD-10-CM | POA: Diagnosis not present

## 2014-10-30 DIAGNOSIS — M5481 Occipital neuralgia: Secondary | ICD-10-CM | POA: Diagnosis not present

## 2014-11-02 DIAGNOSIS — E039 Hypothyroidism, unspecified: Secondary | ICD-10-CM | POA: Diagnosis not present

## 2014-11-02 DIAGNOSIS — I10 Essential (primary) hypertension: Secondary | ICD-10-CM | POA: Diagnosis not present

## 2014-11-02 DIAGNOSIS — E559 Vitamin D deficiency, unspecified: Secondary | ICD-10-CM | POA: Diagnosis not present

## 2014-11-07 DIAGNOSIS — E039 Hypothyroidism, unspecified: Secondary | ICD-10-CM | POA: Diagnosis not present

## 2014-11-07 DIAGNOSIS — J45909 Unspecified asthma, uncomplicated: Secondary | ICD-10-CM | POA: Diagnosis not present

## 2014-11-07 DIAGNOSIS — I10 Essential (primary) hypertension: Secondary | ICD-10-CM | POA: Diagnosis not present

## 2014-11-07 DIAGNOSIS — M542 Cervicalgia: Secondary | ICD-10-CM | POA: Diagnosis not present

## 2014-12-06 DIAGNOSIS — J060 Acute laryngopharyngitis: Secondary | ICD-10-CM | POA: Diagnosis not present

## 2014-12-27 DIAGNOSIS — M5481 Occipital neuralgia: Secondary | ICD-10-CM | POA: Diagnosis not present

## 2014-12-27 DIAGNOSIS — M5032 Other cervical disc degeneration, mid-cervical region: Secondary | ICD-10-CM | POA: Diagnosis not present

## 2015-01-01 DIAGNOSIS — Z1231 Encounter for screening mammogram for malignant neoplasm of breast: Secondary | ICD-10-CM | POA: Diagnosis not present

## 2015-01-03 DIAGNOSIS — Z6825 Body mass index (BMI) 25.0-25.9, adult: Secondary | ICD-10-CM | POA: Diagnosis not present

## 2015-01-03 DIAGNOSIS — M5481 Occipital neuralgia: Secondary | ICD-10-CM | POA: Diagnosis not present

## 2015-01-24 DIAGNOSIS — M5481 Occipital neuralgia: Secondary | ICD-10-CM | POA: Diagnosis not present

## 2015-02-27 DIAGNOSIS — Z23 Encounter for immunization: Secondary | ICD-10-CM | POA: Diagnosis not present

## 2015-02-27 DIAGNOSIS — I10 Essential (primary) hypertension: Secondary | ICD-10-CM | POA: Diagnosis not present

## 2015-05-16 DIAGNOSIS — E039 Hypothyroidism, unspecified: Secondary | ICD-10-CM | POA: Diagnosis not present

## 2015-05-16 DIAGNOSIS — I10 Essential (primary) hypertension: Secondary | ICD-10-CM | POA: Diagnosis not present

## 2015-05-16 DIAGNOSIS — J45909 Unspecified asthma, uncomplicated: Secondary | ICD-10-CM | POA: Diagnosis not present

## 2015-05-16 DIAGNOSIS — E559 Vitamin D deficiency, unspecified: Secondary | ICD-10-CM | POA: Diagnosis not present

## 2015-05-20 DIAGNOSIS — I10 Essential (primary) hypertension: Secondary | ICD-10-CM | POA: Diagnosis not present

## 2015-05-20 DIAGNOSIS — G250 Essential tremor: Secondary | ICD-10-CM | POA: Diagnosis not present

## 2015-05-20 DIAGNOSIS — Z0000 Encounter for general adult medical examination without abnormal findings: Secondary | ICD-10-CM | POA: Diagnosis not present

## 2015-05-20 DIAGNOSIS — J4520 Mild intermittent asthma, uncomplicated: Secondary | ICD-10-CM | POA: Diagnosis not present

## 2015-05-23 DIAGNOSIS — I10 Essential (primary) hypertension: Secondary | ICD-10-CM | POA: Diagnosis not present

## 2015-05-23 DIAGNOSIS — G250 Essential tremor: Secondary | ICD-10-CM | POA: Diagnosis not present

## 2015-05-23 DIAGNOSIS — Z780 Asymptomatic menopausal state: Secondary | ICD-10-CM | POA: Diagnosis not present

## 2015-05-23 DIAGNOSIS — J4520 Mild intermittent asthma, uncomplicated: Secondary | ICD-10-CM | POA: Diagnosis not present

## 2015-05-23 DIAGNOSIS — E039 Hypothyroidism, unspecified: Secondary | ICD-10-CM | POA: Diagnosis not present

## 2015-06-17 ENCOUNTER — Ambulatory Visit (INDEPENDENT_AMBULATORY_CARE_PROVIDER_SITE_OTHER): Payer: Medicare Other | Admitting: Gynecology

## 2015-06-17 ENCOUNTER — Encounter: Payer: Self-pay | Admitting: Gynecology

## 2015-06-17 ENCOUNTER — Other Ambulatory Visit (HOSPITAL_COMMUNITY)
Admission: RE | Admit: 2015-06-17 | Discharge: 2015-06-17 | Disposition: A | Discharge status: Home / Self Care | Payer: Medicare Other | Source: Ambulatory Visit | Attending: Gynecology | Admitting: Gynecology

## 2015-06-17 VITALS — BP 120/76 | Ht 60.0 in | Wt 145.0 lb

## 2015-06-17 DIAGNOSIS — Z124 Encounter for screening for malignant neoplasm of cervix: Secondary | ICD-10-CM

## 2015-06-17 DIAGNOSIS — M8580 Other specified disorders of bone density and structure, unspecified site: Secondary | ICD-10-CM

## 2015-06-17 DIAGNOSIS — N952 Postmenopausal atrophic vaginitis: Secondary | ICD-10-CM | POA: Diagnosis not present

## 2015-06-17 DIAGNOSIS — Z01419 Encounter for gynecological examination (general) (routine) without abnormal findings: Secondary | ICD-10-CM | POA: Diagnosis not present

## 2015-06-17 DIAGNOSIS — R1032 Left lower quadrant pain: Secondary | ICD-10-CM

## 2015-06-17 NOTE — Addendum Note (Signed)
Addended by: Nelva Nay on: 06/17/2015 02:42 PM   Modules accepted: Orders

## 2015-06-17 NOTE — Patient Instructions (Signed)
Check to see when you are due for your next colonoscopy.  [facility]   Address: [address] [place], [place] [address]  [phone]   Follow up for ultrasound as scheduled  You may obtain a copy of any labs that were done today by logging onto MyChart as outlined in the instructions provided with your AVS (after visit summary). The office will not call with normal lab results but certainly if there are any significant abnormalities then we will contact you.   Health Maintenance Adopting a healthy lifestyle and getting preventive care can go a long way to promote health and wellness. Talk with your health care provider about what schedule of regular examinations is right for you. This is a good chance for you to check in with your provider about disease prevention and staying healthy. In between checkups, there are plenty of things you can do on your own. Experts have done a lot of research about which lifestyle changes and preventive measures are most likely to keep you healthy. Ask your health care provider for more information. WEIGHT AND DIET  Eat a healthy diet  Be sure to include plenty of vegetables, fruits, low-fat dairy products, and lean protein.  Do not eat a lot of foods high in solid fats, added sugars, or salt.  Get regular exercise. This is one of the most important things you can do for your health.  Most adults should exercise for at least 150 minutes each week. The exercise should increase your heart rate and make you sweat (moderate-intensity exercise).  Most adults should also do strengthening exercises at least twice a week. This is in addition to the moderate-intensity exercise.  Maintain a healthy weight  Body mass index (BMI) is a measurement that can be used to identify possible weight problems. It estimates body fat based on height and weight. Your health care provider can help determine your BMI and help you achieve or maintain a healthy  weight.  For females 86 years of age and older:   A BMI below 18.5 is considered underweight.  A BMI of 18.5 to 24.9 is normal.  A BMI of 25 to 29.9 is considered overweight.  A BMI of 30 and above is considered obese.  Watch levels of cholesterol and blood lipids  You should start having your blood tested for lipids and cholesterol at 71 years of age, then have this test every 5 years.  You may need to have your cholesterol levels checked more often if:  Your lipid or cholesterol levels are high.  You are older than 71 years of age.  You are at high risk for heart disease.  CANCER SCREENING   Lung Cancer  Lung cancer screening is recommended for adults 83-74 years old who are at high risk for lung cancer because of a history of smoking.  A yearly low-dose CT scan of the lungs is recommended for people who:  Currently smoke.  Have quit within the past 15 years.  Have at least a 30-pack-year history of smoking. A pack year is smoking an average of one pack of cigarettes a day for 1 year.  Yearly screening should continue until it has been 15 years since you quit.  Yearly screening should stop if you develop a health problem that would prevent you from having lung cancer treatment.  Breast Cancer  Practice breast self-awareness. This means understanding how your breasts normally appear and feel.  It also means doing regular breast self-exams.  Let your health care provider know about any changes, no matter how small.  If you are in your 20s or 30s, you should have a clinical breast exam (CBE) by a health care provider every 1-3 years as part of a regular health exam.  If you are 32 or older, have a CBE every year. Also consider having a breast X-ray (mammogram) every year.  If you have a family history of breast cancer, talk to your health care provider about genetic screening.  If you are at high risk for breast cancer, talk to your health care provider about  having an MRI and a mammogram every year.  Breast cancer gene (BRCA) assessment is recommended for women who have family members with BRCA-related cancers. BRCA-related cancers include:  Breast.  Ovarian.  Tubal.  Peritoneal cancers.  Results of the assessment will determine the need for genetic counseling and BRCA1 and BRCA2 testing. Cervical Cancer Routine pelvic examinations to screen for cervical cancer are no longer recommended for nonpregnant women who are considered low risk for cancer of the pelvic organs (ovaries, uterus, and vagina) and who do not have symptoms. A pelvic examination may be necessary if you have symptoms including those associated with pelvic infections. Ask your health care provider if a screening pelvic exam is right for you.   The Pap test is the screening test for cervical cancer for women who are considered at risk.  If you had a hysterectomy for a problem that was not cancer or a condition that could lead to cancer, then you no longer need Pap tests.  If you are older than 65 years, and you have had normal Pap tests for the past 10 years, you no longer need to have Pap tests.  If you have had past treatment for cervical cancer or a condition that could lead to cancer, you need Pap tests and screening for cancer for at least 20 years after your treatment.  If you no longer get a Pap test, assess your risk factors if they change (such as having a new sexual partner). This can affect whether you should start being screened again.  Some women have medical problems that increase their chance of getting cervical cancer. If this is the case for you, your health care provider may recommend more frequent screening and Pap tests.  The human papillomavirus (HPV) test is another test that may be used for cervical cancer screening. The HPV test looks for the virus that can cause cell changes in the cervix. The cells collected during the Pap test can be tested for  HPV.  The HPV test can be used to screen women 9 years of age and older. Getting tested for HPV can extend the interval between normal Pap tests from three to five years.  An HPV test also should be used to screen women of any age who have unclear Pap test results.  After 71 years of age, women should have HPV testing as often as Pap tests.  Colorectal Cancer  This type of cancer can be detected and often prevented.  Routine colorectal cancer screening usually begins at 72 years of age and continues through 71 years of age.  Your health care provider may recommend screening at an earlier age if you have risk factors for colon cancer.  Your health care provider may also recommend using home test kits to check for hidden blood in the stool.  A small camera at the end of a tube can be used  to examine your colon directly (sigmoidoscopy or colonoscopy). This is done to check for the earliest forms of colorectal cancer.  Routine screening usually begins at age 32.  Direct examination of the colon should be repeated every 5-10 years through 71 years of age. However, you may need to be screened more often if early forms of precancerous polyps or small growths are found. Skin Cancer  Check your skin from head to toe regularly.  Tell your health care provider about any new moles or changes in moles, especially if there is a change in a mole's shape or color.  Also tell your health care provider if you have a mole that is larger than the size of a pencil eraser.  Always use sunscreen. Apply sunscreen liberally and repeatedly throughout the day.  Protect yourself by wearing long sleeves, pants, a wide-brimmed hat, and sunglasses whenever you are outside. HEART DISEASE, DIABETES, AND HIGH BLOOD PRESSURE   Have your blood pressure checked at least every 1-2 years. High blood pressure causes heart disease and increases the risk of stroke.  If you are between 53 years and 33 years old, ask  your health care provider if you should take aspirin to prevent strokes.  Have regular diabetes screenings. This involves taking a blood sample to check your fasting blood sugar level.  If you are at a normal weight and have a low risk for diabetes, have this test once every three years after 71 years of age.  If you are overweight and have a high risk for diabetes, consider being tested at a younger age or more often. PREVENTING INFECTION  Hepatitis B  If you have a higher risk for hepatitis B, you should be screened for this virus. You are considered at high risk for hepatitis B if:  You were born in a country where hepatitis B is common. Ask your health care provider which countries are considered high risk.  Your parents were born in a high-risk country, and you have not been immunized against hepatitis B (hepatitis B vaccine).  You have HIV or AIDS.  You use needles to inject street drugs.  You live with someone who has hepatitis B.  You have had sex with someone who has hepatitis B.  You get hemodialysis treatment.  You take certain medicines for conditions, including cancer, organ transplantation, and autoimmune conditions. Hepatitis C  Blood testing is recommended for:  Everyone born from [birth year] through 1966.  Anyone with known risk factors for hepatitis C. Sexually transmitted infections (STIs)  You should be screened for sexually transmitted infections (STIs) including gonorrhea and chlamydia if:  You are sexually active and are younger than 71 years of age.  You are older than 71 years of age and your health care provider tells you that you are at risk for this type of infection.  Your sexual activity has changed since you were last screened and you are at an increased risk for chlamydia or gonorrhea. Ask your health care provider if you are at risk.  If you do not have HIV, but are at risk, it may be recommended that you take a prescription medicine daily to  prevent HIV infection. This is called pre-exposure prophylaxis (PrEP). You are considered at risk if:  You are sexually active and do not regularly use condoms or know the HIV status of your partner(s).  You take drugs by injection.  You are sexually active with a partner who has HIV. Talk with your health care provider  about whether you are at high risk of being infected with HIV. If you choose to begin PrEP, you should first be tested for HIV. You should then be tested every 3 months for as long as you are taking PrEP.  PREGNANCY   If you are premenopausal and you may become pregnant, ask your health care provider about preconception counseling.  If you may become pregnant, take 400 to 800 micrograms (mcg) of folic acid every day.  If you want to prevent pregnancy, talk to your health care provider about birth control (contraception). OSTEOPOROSIS AND MENOPAUSE   Osteoporosis is a disease in which the bones lose minerals and strength with aging. This can result in serious bone fractures. Your risk for osteoporosis can be identified using a bone density scan.  If you are 73 years of age or older, or if you are at risk for osteoporosis and fractures, ask your health care provider if you should be screened.  Ask your health care provider whether you should take a calcium or vitamin D supplement to lower your risk for osteoporosis.  Menopause may have certain physical symptoms and risks.  Hormone replacement therapy may reduce some of these symptoms and risks. Talk to your health care provider about whether hormone replacement therapy is right for you.  HOME CARE INSTRUCTIONS   Schedule regular health, dental, and eye exams.  Stay current with your immunizations.   Do not use any tobacco products including cigarettes, chewing tobacco, or electronic cigarettes.  If you are pregnant, do not drink alcohol.  If you are breastfeeding, limit how much and how often you drink  alcohol.  Limit alcohol intake to no more than 1 drink per day for nonpregnant women. One drink equals 12 ounces of beer, 5 ounces of wine, or 1 ounces of hard liquor.  Do not use street drugs.  Do not share needles.  Ask your health care provider for help if you need support or information about quitting drugs.  Tell your health care provider if you often feel depressed.  Tell your health care provider if you have ever been abused or do not feel safe at home. Document Released: 11/10/2010 Document Revised: 09/11/2013 Document Reviewed: 03/29/2013 [facility] Patient Information 2015 [place], [place]. This information is not intended to replace advice given to you by your health care provider. Make sure you discuss any questions you have with your health care provider.

## 2015-06-17 NOTE — Progress Notes (Signed)
Jeanette [name] 10-Feb-1946 Jeanette [name]        69 y.o.  [id number]  for breast and pelvic exam.  Several issues noted below.  Past medical history,surgical history, problem list, medications, allergies, family history and social history were all reviewed and documented as reviewed in the EPIC chart.  ROS:  Performed with pertinent positives and negatives included in the history, assessment and plan.   Additional significant findings :  none   Exam: Jeanette [name] assistant Filed Vitals:   06/17/15 1403  BP: 120/76  Height: 5' (1.524 m)  Weight: 145 lb (65.772 kg)   General appearance:  Normal affect, orientation and appearance. Skin: Grossly normal HEENT: Without gross lesions.  No cervical or supraclavicular adenopathy. Thyroid normal.  Lungs:  Clear without wheezing, rales or rhonchi Cardiac: RR, without RMG Abdominal:  Soft, nontender, without masses, guarding, rebound, organomegaly or hernia Breasts:  Examined lying and sitting without masses, retractions, discharge or axillary adenopathy. Pelvic:  Ext/BUS/vagina with atrophic changes  Cervix with atrophic changes. Pap smear done  Uterus anteverted, normal size, shape and contour, midline and mobile nontender   Adnexa without masses or tenderness    Anus and perineum normal   Rectovaginal normal sphincter tone without palpated masses or tenderness.    Assessment/Plan:  70 y.o. Jeanette [name] female for breast and pelvic exam.   1. Postmenopausal/atrophic genital changes. Has not been in the offices 2013.  Was started on Vagifem by Jeanette [name] for vaginal dryness.  Patient stop this it is doing well without vaginal dryness. No significant hot flushes or night sweats. No vaginal bleeding. We'll continue to monitor and report any bleeding. 2. Left lower quadrant pain of the last several months. It comes and goes. Usually once or twice daily, fleeting in character. Regular bowel movements without diarrhea or constipation. No frequency dysuria  or urgency. Mother did develop ovarian cancer in her 36s which apparently was found early and treated with surgery alone. Exam today is normal. Check baseline ultrasound rule out nonpalpable abnormalities. Patient will follow up for this.  Check baseline urinalysis 3. Mammography 02/2015. Continue with annual mammography when due. SBE monthly reviewed. 4. Colonoscopy 2012. Patient is going to call [facility] check to see when she is due to have this repeated. If her ultrasound is negative and her discomfort continues I will refer her to gastroenterology for further evaluation. 5. Pap smear 2012. Pap smear done today. No history of abnormal Pap smears. Options to stop screening based on age and current screening recommendations reviewed. Will readdress on an annual basis. 6. Osteopenia. Last DEXA here 2012 T score -2.3 FRAX 10%/1.4%. Reports just having a DEXA at Dr. Wilmon Pali office. She is due to see him next month to discuss results. She'll continue follow up with them in reference to bone health as they are now doing her bone densities. 7. Health maintenance. No routine lab work done as patient reports this done at her primary physician's office. Follow up 1 year, sooner as needed.   Jeanette Auerbach MD, 2:31 PM 06/17/2015

## 2015-06-19 LAB — CYTOLOGY - PAP

## 2015-06-26 ENCOUNTER — Ambulatory Visit (INDEPENDENT_AMBULATORY_CARE_PROVIDER_SITE_OTHER): Payer: Medicare Other | Admitting: Gynecology

## 2015-06-26 ENCOUNTER — Encounter: Payer: Self-pay | Admitting: Gynecology

## 2015-06-26 ENCOUNTER — Ambulatory Visit (INDEPENDENT_AMBULATORY_CARE_PROVIDER_SITE_OTHER): Payer: Medicare Other

## 2015-06-26 ENCOUNTER — Other Ambulatory Visit: Payer: Self-pay | Admitting: Gynecology

## 2015-06-26 VITALS — BP 118/76

## 2015-06-26 DIAGNOSIS — R1032 Left lower quadrant pain: Secondary | ICD-10-CM

## 2015-06-26 DIAGNOSIS — D251 Intramural leiomyoma of uterus: Secondary | ICD-10-CM

## 2015-06-26 DIAGNOSIS — R319 Hematuria, unspecified: Secondary | ICD-10-CM | POA: Diagnosis not present

## 2015-06-26 NOTE — Patient Instructions (Signed)
Office will contact you to arrange for the CT scan and the urology appointment. If you have any more bleeding call the office.

## 2015-06-26 NOTE — Progress Notes (Signed)
Jeanette [name] January 13, 1946 [id number]        69 y.o.  [id number] Presents for ultrasound due to several months of fleeting left lower quadrant pain that comes and goes usually once or twice daily. She does note that she did not tell me previously she had a single episode of hematuria several months ago without pain. Never recurred. Having regular bowel movements without diarrhea or constipation. Mother did have ovarian cancer in her 49s.  Past medical history,surgical history, problem list, medications, allergies, family history and social history were all reviewed and documented in the EPIC chart.  Directed ROS with pertinent positives and negatives documented in the history of present illness/assessment and plan.  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 Endometrial echo 4.5 mm. Right ovary atrophic. Left ovary not visualized. Excessive bowel gas noted in the left adnexa. Cul-de-sac negative. Focal area of shadowing noted in the left lower quadrant on transabdominal questionable fecal.  Assessment/Plan:  70 y.o. [id number] with history as above. Ultrasound is negative noting left ovary not well visualized. Focal area of shadowing in the left lower abdomen/adnexal region  Questionable bowel related. Single episode of hematuria. Was to have check urinalysis last visit but did not apparently dropped off. We'll check UA today. Regardless I think given the history will have urology evaluate to rule out a bladder polyp or tumor. Patient is sure it was not vaginal bleeding. Endometrial echo is relatively thin at 4.5 mm. We'll go ahead and arrange for CT scan also to look at the non-GYN anatomy. Will consider GI referral pending CT scan results.  Patient knows to call if she does not hear about arranging the CT scan and the urology appointment within the next 1-2 weeks.    Anastasio Auerbach MD, 11:53  AM 06/26/2015

## 2015-06-27 ENCOUNTER — Other Ambulatory Visit: Payer: Medicare Other

## 2015-06-27 ENCOUNTER — Telehealth: Payer: Self-pay | Admitting: *Deleted

## 2015-06-27 DIAGNOSIS — R319 Hematuria, unspecified: Secondary | ICD-10-CM

## 2015-06-27 DIAGNOSIS — R1032 Left lower quadrant pain: Secondary | ICD-10-CM | POA: Diagnosis not present

## 2015-06-27 LAB — URINALYSIS W MICROSCOPIC + REFLEX CULTURE
Bacteria, UA: NONE SEEN [HPF]
Bilirubin Urine: NEGATIVE
CASTS: NONE SEEN [LPF]
Crystals: NONE SEEN [HPF]
Glucose, UA: NEGATIVE
Hgb urine dipstick: NEGATIVE
Ketones, ur: NEGATIVE
Leukocytes, UA: NEGATIVE
NITRITE: NEGATIVE
PH: 6 (ref 5.0–8.0)
Protein, ur: NEGATIVE
RBC / HPF: NONE SEEN RBC/HPF (ref <=2)
SPECIFIC GRAVITY, URINE: 1.024 (ref 1.001–1.035)
YEAST: NONE SEEN [HPF]

## 2015-06-27 NOTE — Telephone Encounter (Signed)
-----   Message from Anastasio Auerbach, MD sent at 06/26/2015 11:57 AM EST ----- #1 schedule CT scan of abdomen/pelvis with and without, reference left lower quadrant pain questionable area of shadowing in the left lower abdomen/pelvis on ultrasound #2 urology appointment reference episode of frank hematuria 1 no pain or other symptoms associated with this

## 2015-06-27 NOTE — Telephone Encounter (Signed)
Patient has been informed with all the below, urology will fax me back with time and date to relay to patient, pt coming today at 2:00pm to have bun and creatine drawn.    Appointment on 07/04/15 @ 8:15am at 1st floor radiology at [facility], was told pt the radiology tech Lattie Haw at [phone] that CT should be with contrast only, not w/wo. Pt will need to have BUN and creatine level drawn as well, and will need to pick-up contrast 24 hour prior to appointment on 07/04/15

## 2015-06-28 LAB — URINE CULTURE
Colony Count: NO GROWTH
Organism ID, Bacteria: NO GROWTH

## 2015-07-01 LAB — CREATINE: CREATININE: 1.4 mg/dL — AB (ref <1.3)

## 2015-07-01 NOTE — Telephone Encounter (Signed)
Appointment for [facility] on 08/05/15 @ 1:45pm with Dr. Jeffie Pollock pt aware, pt also aware she must be NPO 4 hours prior to CT scan

## 2015-07-02 LAB — BUN: BUN: 20 mg/dL (ref 7–25)

## 2015-07-02 LAB — CREATININE, SERUM: Creat: 1.61 mg/dL — ABNORMAL HIGH (ref 0.60–0.93)

## 2015-07-04 ENCOUNTER — Encounter (HOSPITAL_COMMUNITY): Payer: Self-pay

## 2015-07-04 ENCOUNTER — Telehealth: Payer: Self-pay

## 2015-07-04 ENCOUNTER — Ambulatory Visit (HOSPITAL_COMMUNITY): Payer: BLUE CROSS/BLUE SHIELD

## 2015-07-04 ENCOUNTER — Ambulatory Visit (HOSPITAL_COMMUNITY)
Admission: RE | Admit: 2015-07-04 | Discharge: 2015-07-04 | Disposition: A | Discharge status: Home / Self Care | Payer: Medicare Other | Source: Ambulatory Visit | Attending: Gynecology | Admitting: Gynecology

## 2015-07-04 DIAGNOSIS — R935 Abnormal findings on diagnostic imaging of other abdominal regions, including retroperitoneum: Secondary | ICD-10-CM | POA: Diagnosis not present

## 2015-07-04 DIAGNOSIS — K5730 Diverticulosis of large intestine without perforation or abscess without bleeding: Secondary | ICD-10-CM | POA: Diagnosis not present

## 2015-07-04 DIAGNOSIS — R1032 Left lower quadrant pain: Secondary | ICD-10-CM | POA: Diagnosis not present

## 2015-07-04 DIAGNOSIS — H35372 Puckering of macula, left eye: Secondary | ICD-10-CM | POA: Diagnosis not present

## 2015-07-04 DIAGNOSIS — H2513 Age-related nuclear cataract, bilateral: Secondary | ICD-10-CM | POA: Diagnosis not present

## 2015-07-04 MED ORDER — IOHEXOL 300 MG/ML  SOLN: 100.0000 mL | Freq: Once | INTRAMUSCULAR | Status: DC | PRN

## 2015-07-04 NOTE — Telephone Encounter (Signed)
I called patient with lab result below. She asked would we be calling her with CT results from the scan she had this morning. Result is in Epic. I told her once you reviewed it we would be in touch.

## 2015-07-04 NOTE — Telephone Encounter (Signed)
-----   Message from Anastasio Auerbach, MD sent at 07/02/2015 12:43 PM EST ----- For some reason I got a second creatinine back which is 1.6, up from 1.4.   I think given that it is elevated I would recommend patient obtain copies of her labs from the [facility] and follow up with her primary physician to see if they recommend any further evaluation of her renal function

## 2015-07-05 ENCOUNTER — Telehealth: Payer: Self-pay

## 2015-07-05 NOTE — Telephone Encounter (Signed)
Patient called this morning stated someone had called her an hour before. It was not me so I checked with Dr. Loetta Rough and he said "I did call her and left a message on her machine telling her that the CT scan did not show anything of significance. I will call her back beginning of next week once I hear from the radiologist."   I called patient back and relayed to her what he wrote. She will expect to hear from [place] again next week.

## 2015-07-08 NOTE — Telephone Encounter (Signed)
I called the patient with the CT results. It essentially was negative excepting 2 small areas on the liver and kidney. I had the radiologist review and make recommendations as far as follow up and they had recommended to repeat the CT scan with and without intravenous contrast in 6 months. I relayed this to the patient and she agrees to call and follow up for this. The importance of follow up to assure that these are stable findings was stressed.

## 2015-07-10 ENCOUNTER — Telehealth: Payer: Self-pay | Admitting: *Deleted

## 2015-07-10 ENCOUNTER — Encounter: Payer: Self-pay | Admitting: Gastroenterology

## 2015-07-10 NOTE — Telephone Encounter (Signed)
Pt called asking if she could call Dr.Stark office to have colonoscopy, I told pt yes she should be able to do this.

## 2015-07-25 DIAGNOSIS — E782 Mixed hyperlipidemia: Secondary | ICD-10-CM | POA: Diagnosis not present

## 2015-07-25 DIAGNOSIS — I10 Essential (primary) hypertension: Secondary | ICD-10-CM | POA: Diagnosis not present

## 2015-08-01 DIAGNOSIS — E039 Hypothyroidism, unspecified: Secondary | ICD-10-CM | POA: Diagnosis not present

## 2015-08-01 DIAGNOSIS — I10 Essential (primary) hypertension: Secondary | ICD-10-CM | POA: Diagnosis not present

## 2015-08-01 DIAGNOSIS — E782 Mixed hyperlipidemia: Secondary | ICD-10-CM | POA: Diagnosis not present

## 2015-08-05 DIAGNOSIS — Z0000 Encounter for general adult medical examination without abnormal findings: Secondary | ICD-10-CM | POA: Diagnosis not present

## 2015-08-05 DIAGNOSIS — R310 Gross hematuria: Secondary | ICD-10-CM | POA: Diagnosis not present

## 2015-08-14 ENCOUNTER — Ambulatory Visit (AMBULATORY_SURGERY_CENTER): Payer: Self-pay

## 2015-08-14 VITALS — Ht 60.0 in | Wt 144.0 lb

## 2015-08-14 DIAGNOSIS — Z1211 Encounter for screening for malignant neoplasm of colon: Secondary | ICD-10-CM

## 2015-08-14 MED ORDER — SUPREP BOWEL PREP KIT 17.5-3.13-1.6 GM/177ML PO SOLN: 1.0000 | Freq: Once | ORAL | Status: DC

## 2015-08-14 NOTE — Progress Notes (Signed)
No allergies to eggs or soy No past problems with anesthesia No home oxygen No diet/weight loss meds  Has email and internet; registered for emmi 

## 2015-08-28 ENCOUNTER — Ambulatory Visit (AMBULATORY_SURGERY_CENTER): Payer: Medicare Other | Admitting: Gastroenterology

## 2015-08-28 ENCOUNTER — Encounter: Payer: Self-pay | Admitting: Gastroenterology

## 2015-08-28 VITALS — BP 120/66 | HR 61 | Temp 98.4°F | Resp 12 | Ht 61.0 in | Wt 144.0 lb

## 2015-08-28 DIAGNOSIS — D120 Benign neoplasm of cecum: Secondary | ICD-10-CM | POA: Diagnosis not present

## 2015-08-28 DIAGNOSIS — K635 Polyp of colon: Secondary | ICD-10-CM | POA: Diagnosis not present

## 2015-08-28 DIAGNOSIS — Z1211 Encounter for screening for malignant neoplasm of colon: Secondary | ICD-10-CM

## 2015-08-28 DIAGNOSIS — D124 Benign neoplasm of descending colon: Secondary | ICD-10-CM

## 2015-08-28 DIAGNOSIS — D123 Benign neoplasm of transverse colon: Secondary | ICD-10-CM | POA: Diagnosis not present

## 2015-08-28 DIAGNOSIS — I10 Essential (primary) hypertension: Secondary | ICD-10-CM | POA: Diagnosis not present

## 2015-08-28 DIAGNOSIS — Z8673 Personal history of transient ischemic attack (TIA), and cerebral infarction without residual deficits: Secondary | ICD-10-CM | POA: Diagnosis not present

## 2015-08-28 MED ORDER — SODIUM CHLORIDE 0.9 % IV SOLN: 500.0000 mL | INTRAVENOUS | Status: DC

## 2015-08-28 NOTE — Progress Notes (Signed)
Called to room to assist during endoscopic procedure.  Patient ID and intended procedure confirmed with present staff. Received instructions for my participation in the procedure from the performing physician.  

## 2015-08-28 NOTE — Progress Notes (Signed)
Stable to RR 

## 2015-08-28 NOTE — Patient Instructions (Signed)
YOU HAD AN ENDOSCOPIC PROCEDURE TODAY AT THE [facility]:   Refer to the procedure report that was given to you for any specific questions about what was found during the examination.  If the procedure report does not answer your questions, please call your gastroenterologist to clarify.  If you requested that your care partner not be given the details of your procedure findings, then the procedure report has been included in a sealed envelope for you to review at your convenience later.  YOU SHOULD EXPECT: Some feelings of bloating in the abdomen. Passage of more gas than usual.  Walking can help get rid of the air that was put into your GI tract during the procedure and reduce the bloating. If you had a lower endoscopy (such as a colonoscopy or flexible sigmoidoscopy) you may notice spotting of blood in your stool or on the toilet paper. If you underwent a bowel prep for your procedure, you may not have a normal bowel movement for a few days.  Please Note:  You might notice some irritation and congestion in your nose or some drainage.  This is from the oxygen used during your procedure.  There is no need for concern and it should clear up in a day or so.  SYMPTOMS TO REPORT IMMEDIATELY:   Following lower endoscopy (colonoscopy or flexible sigmoidoscopy):  Excessive amounts of blood in the stool  Significant tenderness or worsening of abdominal pains  Swelling of the abdomen that is new, acute  Fever of 100F or higher    For urgent or emergent issues, a gastroenterologist can be reached at any hour by calling [phone].   DIET: Your first meal following the procedure should be a small meal and then it is ok to progress to your normal diet. Heavy or fried foods are harder to digest and may make you feel nauseous or bloated.  Likewise, meals heavy in dairy and vegetables can increase bloating.  Drink plenty of fluids but you should avoid alcoholic beverages for 24  hours.  ACTIVITY:  You should plan to take it easy for the rest of today and you should NOT DRIVE or use heavy machinery until tomorrow (because of the sedation medicines used during the test).    FOLLOW UP: Our staff will call the number listed on your records the next business day following your procedure to check on you and address any questions or concerns that you may have regarding the information given to you following your procedure. If we do not reach you, we will leave a message.  However, if you are feeling well and you are not experiencing any problems, there is no need to return our call.  We will assume that you have returned to your regular daily activities without incident.  If any biopsies were taken you will be contacted by phone or by letter within the next 1-3 weeks.  Please call us at [phone] if you have not heard about the biopsies in 3 weeks.    SIGNATURES/CONFIDENTIALITY: You and/or your care partner have signed paperwork which will be entered into your electronic medical record.  These signatures attest to the fact that that the information above on your After Visit Summary has been reviewed and is understood.  Full responsibility of the confidentiality of this discharge information lies with you and/or your care-partner.   Resume medications. Information given on polyps,diverticulosis and high fiber diet. 

## 2015-08-28 NOTE — Op Note (Signed)
[facility] Patient Name: Jeanette [name] Procedure Date: 08/28/2015 2:58 PM MRN: [id number] Endoscopist: Ladene Artist , MD Age: 70 Date of Birth: Sep 22, 1945 Gender: Female Procedure:                Colonoscopy Indications:              Screening for colorectal malignant neoplasm Medicines:                Monitored Anesthesia Care Procedure:                Pre-Anesthesia Assessment:                           - Prior to the procedure, a History and Physical                            was performed, and patient medications and                            allergies were reviewed. The patient's tolerance of                            previous anesthesia was also reviewed. The risks                            and benefits of the procedure and the sedation                            options and risks were discussed with the patient.                            All questions were answered, and informed consent                            was obtained. Prior Anticoagulants: The patient has                            taken no previous anticoagulant or antiplatelet                            agents. ASA Grade Assessment: II - A patient with                            mild systemic disease. After reviewing the risks                            and benefits, the patient was deemed in                            satisfactory condition to undergo the procedure.                           After obtaining informed consent, the colonoscope  was passed under direct vision. Throughout the                            procedure, the patient's blood pressure, pulse, and                            oxygen saturations were monitored continuously. The                            Model [id number]-[id number]) scope was introduced                            through the anus and advanced to the the cecum,                            identified by appendiceal orifice and ileocecal                       valve. The colonoscopy was performed without                            difficulty. The patient tolerated the procedure                            well. The quality of the bowel preparation was                            good. The ileocecal valve, appendiceal orifice, and                            rectum were photographed. Scope In: 3:07:21 PM Scope Out: 3:22:38 PM Scope Withdrawal Time: 0 hours 11 minutes 59 seconds  Total Procedure Duration: 0 hours 15 minutes 17 seconds  Findings:                 The digital rectal exam was normal.                           Two sessile polyps were found in the descending                            colon and cecum. The polyps were 6 to 7 mm in size.                            These polyps were removed with a cold snare.                            Resection and retrieval were complete.                           A 5 mm polyp was found in the hepatic flexure. The                            polyp was sessile. The polyp was removed with a  cold biopsy forceps. Resection and retrieval were                            complete.                           A few small-mouthed diverticula were found in the                            sigmoid colon. There was evidence of diverticular                            spasm.                           The exam was otherwise normal throughout the                            examined colon.                           The retroflexed view of the distal rectum and anal                            verge was normal and showed no anal or rectal                            abnormalities. Complications:            No immediate complications. Estimated Blood Loss:     Estimated blood loss: none. Impression:               - Two 6 to 7 mm polyps in the cecum and in the                            ascending colon, removed with a cold snare.                            Resected and retrieved.                            - One 5 mm polyp at the hepatic flexure, removed                            with a cold biopsy forceps. Resected and retrieved.                           - Mild diverticulosis in the sigmoid colon. Recommendation:           - Patient has a contact number available for                            emergencies. The signs and symptoms of potential                            delayed complications were discussed with  the                            patient. Return to normal activities tomorrow.                            Written discharge instructions were provided to the                            patient.                           - Resume previous diet.                           - Continue present medications.                           - Await pathology results.                           - Repeat colonoscopy in 5 years for surveillance if                            polyp(s) precancerous, otherwise no plans for                            future screening colonoscopies due to age. Ladene Artist, MD 08/28/2015 3:28:43 PM This report has been signed electronically.

## 2015-08-29 ENCOUNTER — Telehealth: Payer: Self-pay

## 2015-08-29 NOTE — Telephone Encounter (Signed)
  Follow up Call-  Call back number 08/28/2015  Post procedure Call Back phone  # [phone]  Permission to leave phone message Yes     Patient questions:  Do you have a fever, pain , or abdominal swelling? No. Pain Score  0 *  Have you tolerated food without any problems? Yes.    Have you been able to return to your normal activities? Yes.    Do you have any questions about your discharge instructions: Diet   No. Medications  No. Follow up visit  No.  Do you have questions or concerns about your Care? No.  Actions: * If pain score is 4 or above: No action needed, pain <4.

## 2015-09-04 ENCOUNTER — Encounter: Payer: Self-pay | Admitting: Gastroenterology

## 2015-09-05 DIAGNOSIS — M791 Myalgia: Secondary | ICD-10-CM | POA: Diagnosis not present

## 2015-09-05 DIAGNOSIS — M62830 Muscle spasm of back: Secondary | ICD-10-CM | POA: Diagnosis not present

## 2015-09-12 DIAGNOSIS — M791 Myalgia: Secondary | ICD-10-CM | POA: Diagnosis not present

## 2015-09-12 DIAGNOSIS — M62830 Muscle spasm of back: Secondary | ICD-10-CM | POA: Diagnosis not present

## 2015-09-19 DIAGNOSIS — M4806 Spinal stenosis, lumbar region: Secondary | ICD-10-CM | POA: Diagnosis not present

## 2015-09-19 DIAGNOSIS — M5416 Radiculopathy, lumbar region: Secondary | ICD-10-CM | POA: Diagnosis not present

## 2015-09-25 DIAGNOSIS — L918 Other hypertrophic disorders of the skin: Secondary | ICD-10-CM | POA: Diagnosis not present

## 2015-09-25 DIAGNOSIS — L853 Xerosis cutis: Secondary | ICD-10-CM | POA: Diagnosis not present

## 2015-09-25 DIAGNOSIS — L814 Other melanin hyperpigmentation: Secondary | ICD-10-CM | POA: Diagnosis not present

## 2015-09-25 DIAGNOSIS — Z85828 Personal history of other malignant neoplasm of skin: Secondary | ICD-10-CM | POA: Diagnosis not present

## 2015-09-25 DIAGNOSIS — D2239 Melanocytic nevi of other parts of face: Secondary | ICD-10-CM | POA: Diagnosis not present

## 2015-09-25 DIAGNOSIS — L718 Other rosacea: Secondary | ICD-10-CM | POA: Diagnosis not present

## 2015-09-25 DIAGNOSIS — L820 Inflamed seborrheic keratosis: Secondary | ICD-10-CM | POA: Diagnosis not present

## 2015-09-25 DIAGNOSIS — D692 Other nonthrombocytopenic purpura: Secondary | ICD-10-CM | POA: Diagnosis not present

## 2015-09-25 DIAGNOSIS — L570 Actinic keratosis: Secondary | ICD-10-CM | POA: Diagnosis not present

## 2015-09-25 DIAGNOSIS — L821 Other seborrheic keratosis: Secondary | ICD-10-CM | POA: Diagnosis not present

## 2015-10-03 DIAGNOSIS — S83281D Other tear of lateral meniscus, current injury, right knee, subsequent encounter: Secondary | ICD-10-CM | POA: Diagnosis not present

## 2016-01-03 DIAGNOSIS — H35372 Puckering of macula, left eye: Secondary | ICD-10-CM | POA: Diagnosis not present

## 2016-01-31 DIAGNOSIS — Z1231 Encounter for screening mammogram for malignant neoplasm of breast: Secondary | ICD-10-CM | POA: Diagnosis not present

## 2016-02-13 DIAGNOSIS — E039 Hypothyroidism, unspecified: Secondary | ICD-10-CM | POA: Diagnosis not present

## 2016-02-13 DIAGNOSIS — I10 Essential (primary) hypertension: Secondary | ICD-10-CM | POA: Diagnosis not present

## 2016-02-13 DIAGNOSIS — E782 Mixed hyperlipidemia: Secondary | ICD-10-CM | POA: Diagnosis not present

## 2016-02-20 DIAGNOSIS — G250 Essential tremor: Secondary | ICD-10-CM | POA: Diagnosis not present

## 2016-02-20 DIAGNOSIS — Z23 Encounter for immunization: Secondary | ICD-10-CM | POA: Diagnosis not present

## 2016-02-20 DIAGNOSIS — J4520 Mild intermittent asthma, uncomplicated: Secondary | ICD-10-CM | POA: Diagnosis not present

## 2016-02-20 DIAGNOSIS — E782 Mixed hyperlipidemia: Secondary | ICD-10-CM | POA: Diagnosis not present

## 2016-02-20 DIAGNOSIS — E039 Hypothyroidism, unspecified: Secondary | ICD-10-CM | POA: Diagnosis not present

## 2016-03-10 ENCOUNTER — Encounter: Payer: Self-pay | Admitting: *Deleted

## 2016-03-11 ENCOUNTER — Encounter: Payer: Self-pay | Admitting: Diagnostic Neuroimaging

## 2016-03-11 ENCOUNTER — Ambulatory Visit (INDEPENDENT_AMBULATORY_CARE_PROVIDER_SITE_OTHER): Payer: Medicare Other | Admitting: Diagnostic Neuroimaging

## 2016-03-11 VITALS — BP 120/79 | HR 65 | Ht 60.0 in | Wt 144.2 lb

## 2016-03-11 DIAGNOSIS — M5416 Radiculopathy, lumbar region: Secondary | ICD-10-CM

## 2016-03-11 DIAGNOSIS — G250 Essential tremor: Secondary | ICD-10-CM | POA: Diagnosis not present

## 2016-03-11 DIAGNOSIS — G959 Disease of spinal cord, unspecified: Secondary | ICD-10-CM | POA: Diagnosis not present

## 2016-03-11 DIAGNOSIS — R1310 Dysphagia, unspecified: Secondary | ICD-10-CM

## 2016-03-11 DIAGNOSIS — G20 Parkinson's disease: Secondary | ICD-10-CM | POA: Diagnosis not present

## 2016-03-11 NOTE — Patient Instructions (Addendum)
Thank you for coming to see [place] at [facility]. I hope we have been able to provide you high quality care today.  You may receive a patient satisfaction survey over the next few weeks. We would appreciate your feedback and comments so that we may continue to improve ourselves and the health of our patients.  - check MRI brain  - continue propranolol for essential tremor and BP control  - consider adding carbidopa/levodopa for parkinsonism tremor at next visit  - check speech/swallow therapy evaluation   ~~~~~~~~~~~~~~~~~~~~~~~~~~~~~~~~~~~~~~~~~~~~~~~~~~~~~~~~~~~~~~~~~  DR. Jaianna Nicoll'S GUIDE TO HAPPY AND HEALTHY LIVING These are some of my general health and wellness recommendations. Some of them may apply to you better than others. Please use common sense as you try these suggestions and feel free to ask me any questions.   ACTIVITY/FITNESS Mental, social, emotional and physical stimulation are very important for brain and body health. Try learning a new activity (arts, music, language, sports, games).  Keep moving your body to the best of your abilities. You can do this at home, inside or outside, the park, community center, gym or anywhere you like. Consider a physical therapist or personal trainer to get started. Consider the app Sworkit. Fitness trackers such as smart-watches, smart-phones or Fitbits can help as well.   NUTRITION Eat more plants: colorful vegetables, nuts, seeds and berries.  Eat less sugar, salt, preservatives and processed foods.  Avoid toxins such as cigarettes and alcohol.  Drink water when you are thirsty. Warm water with a slice of lemon is an excellent morning drink to start the day.  Consider these websites for more information The Nutrition Source (https://www.henry-hernandez.biz/) Precision Nutrition (WindowBlog.ch)   RELAXATION Consider practicing mindfulness meditation or other  relaxation techniques such as deep breathing, prayer, yoga, tai chi, massage. See website mindful.org or the apps Headspace or Calm to help get started.   SLEEP Try to get at least 7-8+ hours sleep per day. Regular exercise and reduced caffeine will help you sleep better. Practice good sleep hygeine techniques. See website sleep.org for more information.   PLANNING Prepare estate planning, living will, healthcare POA documents. Sometimes this is best planned with the help of an attorney. Theconversationproject.org and agingwithdignity.org are excellent resources.

## 2016-03-11 NOTE — Progress Notes (Signed)
[facility]  PATIENT: Jeanette [name] DOB: 25-Jul-1945  REFERRING CLINICIAN: Ashby Dawes HISTORY FROM: patient  REASON FOR VISIT: new consult    HISTORICAL  CHIEF COMPLAINT:  Chief Complaint  Patient presents with  . Tremors    rm 7, New Pt, "shaking in hands x 10 years, getting worse, some days worse than others"    HISTORY OF PRESENT ILLNESS:   70 year old female here for evaluation of tremor. Patient is right handed and has hypertension.   For past 8-10 years, patient has had intermittent postural and action tremor. This is progressively worsened over time. More recently she has noticed subtle resting tremor. Patient is having more difficulty with holding objects, drinking from a cup, handwriting. Symptoms fluctuate from day to day. She has a glass of wine sometimes this calms down some of her tremor. Patient has been on propranolol for blood pressure control, and was advised to take an extra tablet of this to help with tremor control. However patient has not tried taking the extra propranolol.  Patient has significant family history for tremor. Patient's mother had tremor but not specifically diagnosed. Patient's mother also had polycythemia vera and later leukemia. Patient's son has tremor starting at age 61 years old. Patient's maternal aunts 2 and maternal uncle 1 at Parkinson's disease. Several first cousins on her mother's side also have Parkinson's disease. A different maternal uncle had multiple sclerosis.  Patient also has noted some hoarse voice, intermittent choking sensation, mild drooling from the right side of her mouth. No change in smell or taste. No significant anxiety or constipation. No leg tremor. She has some mild balance and walking difficulty.  Patient has had multiple cervical spine surgeries in 2003, 2007 in 2014. 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 She is also had lumbar spine surgeries in 2001 and 2014. Patient had  some complication of surgery including hoarse voice.  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 Conventional angiogram was performed which showed tortuous right ICA without evidence of dissection.    REVIEW OF SYSTEMS: Full 14 system review of systems performed and negative with exception of: Aching muscles snoring choking.  ALLERGIES: Allergies  Allergen Reactions  . Compazine [Prochlorperazine Edisylate] Swelling    Mouth and tongue swell, throat swells closed    HOME MEDICATIONS: Outpatient Medications Prior to Visit  Medication Sig Dispense Refill  . albuterol (PROVENTIL HFA;VENTOLIN HFA) 108 (90 BASE) MCG/ACT inhaler Inhale 2 puffs into the lungs every 6 (six) hours as needed for wheezing. Reported on 08/28/2015    . Calcium Carbonate-Vitamin D (CALCIUM + D PO) Take 1 tablet by mouth daily.     . Cyanocobalamin (B-12 SL) Place under the tongue.    . Multiple Vitamin (MULTIVITAMIN) tablet Take 1 tablet by mouth daily.    . propranolol ER (INDERAL LA) 80 MG 24 hr capsule Take 80 mg by mouth daily.    Marland Kitchen gabapentin (NEURONTIN) 300 MG capsule     . levothyroxine (SYNTHROID, LEVOTHROID) 75 MCG tablet Take 75 mcg by mouth daily.    Marland Kitchen losartan (COZAAR) 50 MG tablet Take 50 mg by mouth daily.     No facility-administered medications prior to visit.     PAST MEDICAL HISTORY: Past Medical History:  Diagnosis Date  . Arthritis   . Asthma    hx  . Hypertension   . Hypothyroidism   . Pneumonia  hx  . PONV (postoperative nausea and vomiting)    has scop patch to apply  10/13/12  . Stroke [facility]) 2011   tia's  . Thyroid disease   . TIA (transient ischemic attack)     PAST SURGICAL HISTORY: Past Surgical History:  Procedure Laterality Date  . ANTERIOR CERVICAL DECOMP/DISCECTOMY FUSION N/A 10/14/2012   Procedure: ANTERIOR CERVICAL DECOMPRESSION/DISCECTOMY FUSION 1 LEVEL/HARDWARE  REMOVAL;  Surgeon: Kristeen Miss, MD;  Location: [facility];  Service: Neurosurgery;  Laterality: N/A;  C7-T1 Anterior cervical decompression/diskectomy/fusion/removal of old synthes plate  . BACK SURGERY  01  . DILATION AND CURETTAGE OF UTERUS    . HYSTEROSCOPY  2004, 2007   endometrial polyp  . KNEE SURGERY Right    70 yrs old  . NECK SURGERY  07,03   cerv,Fusion  . ROTATOR CUFF REPAIR Right 2015  . TONSILLECTOMY      FAMILY HISTORY: Family History  Problem Relation Age of Onset  . Leukemia Mother   . Ovarian cancer Mother   . Tremor Mother   . Polycythemia Mother   . Hypertension Father   . Heart disease Father   . Parkinson's disease Maternal Aunt   . Stroke Maternal Aunt   . Parkinson's disease Maternal Uncle   . Multiple sclerosis Maternal Uncle   . Colon cancer Neg Hx     SOCIAL HISTORY:  Social History   Social History  . Marital status: Married    Spouse name: N/A  . Number of children: 2  . Years of education: 14   Occupational History  .      retired Insurance claims handler   Social History Main Topics  . Smoking status: Never Smoker  . Smokeless tobacco: Never Used  . Alcohol use 0.6 oz/week    1 Glasses of wine per week     Comment: wine 1-2 week  . Drug use: No  . Sexual activity: Yes    Birth control/ protection: Post-menopausal     Comment: 1st intercourse 44 yo-1 partner   Other Topics Concern  . Not on file   Social History Narrative   Lives with husband   Caffeine - coffee/tea, 1-2 cups daily     PHYSICAL EXAM  GENERAL EXAM/CONSTITUTIONAL: Vitals:  Vitals:   03/11/16 0903  BP: 120/79  Pulse: 65  Weight: 144 lb 3.2 oz (65.4 kg)  Height: 5' (1.524 m)     Body mass index is 28.16 kg/m.  Visual Acuity Screening   Right eye Left eye Both eyes  Without correction:     With correction: 20/30 20/40      Patient is in no distress; well developed, nourished and groomed; neck is supple  CARDIOVASCULAR:  Examination of carotid  arteries is normal; no carotid bruits  Regular rate and rhythm, no murmurs  Examination of peripheral vascular system by observation and palpation is normal  EYES:  Ophthalmoscopic exam of optic discs and posterior segments is normal; no papilledema or hemorrhages  MUSCULOSKELETAL:  Gait, strength, tone, movements noted in Neurologic exam below  NEUROLOGIC: MENTAL STATUS:  No flowsheet data found.  awake, alert, oriented to person, place and time  recent and remote memory intact  normal attention and concentration  language fluent, comprehension intact, naming intact,   fund of knowledge appropriate  CRANIAL NERVE:   2nd - no papilledema on fundoscopic exam  2nd, 3rd, 4th, 6th - pupils equal and reactive to light, visual fields full to confrontation, extraocular muscles intact, no nystagmus  5th -  facial sensation symmetric  7th - facial strength symmetric  8th - hearing intact  9th - palate elevates symmetrically, uvula midline  11th - shoulder shrug symmetric  12th - tongue protrusion midline  SUBTLE HEAD AND VOICE TREMOR  SLIGHTLY HOARSE VOICE  MOTOR:   normal bulk, full strength in the BUE, BLE  SUBTLE INCREASED TONE IN LUE > RUE  MODERATE POSTURAL AND ACTION TREMOR IN BUE  SUBTLE RESTING TREMOR IN RUE > LUE; ESP WITH CONTRALATERAL RAPID ALTERNATING MOVEMENTS  MILD BRADYKINESIA IN LUE > RUE   SENSORY:   normal and symmetric to light touch, temperature, vibration  COORDINATION:   finger-nose-finger, fine finger movements normal  REFLEXES:   deep tendon reflexes present and symmetric  TRACE AT ANKLES  GAIT/STATION:   narrow based gait; CAUTIOUS GAIT; SHORT STEPS; DECR ARM SWING; SUBTLE TREMOR IN LUE WITH WALKING    DIAGNOSTIC DATA (LABS, IMAGING, TESTING) - I reviewed patient records, labs, notes, testing and imaging myself where available.  Lab Results  Component Value Date   WBC 7.9 05/28/2013   HGB 13.9 05/28/2013   HCT  41.0 05/28/2013   MCV 91.4 05/28/2013   PLT 352 05/28/2013      Component Value Date/Time   NA 143 05/28/2013 1317   K 4.0 05/28/2013 1317   CL 104 05/28/2013 1317   CO2 31 03/07/2013 0540   GLUCOSE 90 05/28/2013 1317   BUN 20 06/27/2015 1407   CREATININE 1.4 (H) 06/27/2015 1407   CREATININE 1.61 (H) 06/27/2015 1407   CALCIUM 8.2 (L) 03/07/2013 0540   PROT 7.1 12/30/2010 0935   ALBUMIN 3.9 12/30/2010 0935   AST 19 12/30/2010 0935   ALT 14 12/30/2010 0935   ALKPHOS 79 12/30/2010 0935   BILITOT 0.5 12/30/2010 0935   GFRNONAA 87 (L) 03/07/2013 0540   GFRAA >90 03/07/2013 0540   No results found for: CHOL, HDL, LDLCALC, LDLDIRECT, TRIG, CHOLHDL No results found for: HGBA1C No results found for: VITAMINB12 No results found for: TSH  10/01/12 MRI cervical spine [I reviewed images myself and agree with interpretation. -VRP]  - Fusion from C3-C7 has a satisfactory appearance.  Mild gliosis of the cord at the C4-5 level. - Progressive degenerative disease at C7-T1.  Advanced facet arthropathy with anterolisthesis of 5 mm.  Disc degeneration and bulging.  Effacement subarachnoid space.  Bilateral neural foraminal stenosis that could compress either or both C8 nerve roots.  This is more pronounced on the right.  02-19-2013 MRI lumbar spine [I reviewed images myself and agree with interpretation. -VRP]  1. New broad-based disc protrusion at L1-2 with slight caudal down turning and mild to moderate mass effect on the thecal sac. 2. Stable moderate to moderately severe multifactorial spinal and bilateral lateral recess stenosis at L2-3. 3. Stable posterior and interbody fusion changes at L3-4 and L4-5.  05/28/13 CT neck  1. Focal tapering of the right internal carotid artery with decrease caliber of the distal ICA. 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 An acute dissection is also considered. 2. The vocal cords appear symmetric. An acute vascular event could be affecting  function of the will vocal cords. 3. Extensive cervical spine fusion.  05/28/13 carotid and vertebral angiogram - Angiographically no evidence of intimal flaps, intraluminal filling defects or stenosis to suggest dissection of the carotid artery systems bilaterally. - Mild prominence of the right internal jugular vein in the neck without stenoses. 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  ASSESSMENT AND PLAN  70 y.o. year old female here with postural and action tremor in upper extremities for past 8-10 years, now with increasing resting tremor, bradykinesia and rigidity. 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 Also with history of cervical myelopathy and lumbar radiculopathy.  Dx: essential tremor + parkinson's disease + cervical myelopathy + lumbar radiculopathy  1. Essential tremor   2. Parkinsonism, unspecified Parkinsonism type ([place])   3. Cervical myelopathy ([place])   4. Lumbar radiculopathy      PLAN: - check MRI brain to rule out secondary causes of tremor (stroke, mass, inflammation) - continue propranolol for essential tremor and BP control - consider adding carbidopa/levodopa for parkinsonism tremor at next visit - check speech/swallow therapy evaluation for dysphagia and hoarse voice  Orders Placed This Encounter  Procedures  . MR BRAIN WO CONTRAST  . Ambulatory referral to Speech Therapy   Return in about 2 months (around 05/11/2016).    Penni Bombard, MD [id number], 0000000 AM Certified in Neurology, Neurophysiology and Neuroimaging  [facility] [address] [place], [place] [address] [phone]

## 2016-03-20 ENCOUNTER — Ambulatory Visit
Admission: RE | Admit: 2016-03-20 | Discharge: 2016-03-20 | Disposition: A | Discharge status: Home / Self Care | Payer: Medicare Other | Source: Ambulatory Visit | Attending: Diagnostic Neuroimaging | Admitting: Diagnostic Neuroimaging

## 2016-03-20 DIAGNOSIS — G250 Essential tremor: Secondary | ICD-10-CM | POA: Diagnosis not present

## 2016-03-20 DIAGNOSIS — G20 Parkinson's disease: Secondary | ICD-10-CM | POA: Diagnosis not present

## 2016-03-20 DIAGNOSIS — R930 Abnormal findings on diagnostic imaging of skull and head, not elsewhere classified: Secondary | ICD-10-CM | POA: Diagnosis not present

## 2016-04-06 DIAGNOSIS — M542 Cervicalgia: Secondary | ICD-10-CM | POA: Diagnosis not present

## 2016-04-06 DIAGNOSIS — M25511 Pain in right shoulder: Secondary | ICD-10-CM | POA: Diagnosis not present

## 2016-04-15 DIAGNOSIS — M25511 Pain in right shoulder: Secondary | ICD-10-CM | POA: Diagnosis not present

## 2016-04-20 DIAGNOSIS — M25511 Pain in right shoulder: Secondary | ICD-10-CM | POA: Diagnosis not present

## 2016-05-12 DIAGNOSIS — M25511 Pain in right shoulder: Secondary | ICD-10-CM | POA: Diagnosis not present

## 2016-05-13 DIAGNOSIS — M25511 Pain in right shoulder: Secondary | ICD-10-CM | POA: Diagnosis not present

## 2016-05-20 ENCOUNTER — Ambulatory Visit: Payer: Medicare Other | Attending: Diagnostic Neuroimaging

## 2016-05-20 DIAGNOSIS — R498 Other voice and resonance disorders: Secondary | ICD-10-CM

## 2016-05-20 DIAGNOSIS — R1310 Dysphagia, unspecified: Secondary | ICD-10-CM | POA: Insufficient documentation

## 2016-05-20 NOTE — Therapy (Signed)
[facility] [address] [place], [place], [address] Phone: [phone]   Fax:  [phone]  Patient Details  Name: Jeanette [name] MRN: [id number] Date of Birth: 03-12-1946 Referring Provider:  Penni Bombard, MD  Encounter Date: 05/20/2016   ARRIVED - NO CHARGE  Pt arrived with complaints of hoarseness, dysphagia, with ? ddx of Parkinson's Disease.  Pt with extensive orthopedic history-  question whether these multiple surgeries in cervical areas have affected pt's swallowing and voice.   SLP is recommending ENT eval, and modified barium swallow eval to ascertain more information prior to proceeding with ST eval.   Thank you.  [facility] ,[place], [place]  05/20/2016, 5:11 PM  [facility] [address] [place], [place], [address] Phone: [phone]   Fax:  [phone]

## 2016-05-25 ENCOUNTER — Ambulatory Visit: Payer: Medicare Other | Admitting: Diagnostic Neuroimaging

## 2016-06-02 DIAGNOSIS — S46011A Strain of muscle(s) and tendon(s) of the rotator cuff of right shoulder, initial encounter: Secondary | ICD-10-CM | POA: Diagnosis not present

## 2016-06-03 DIAGNOSIS — J4520 Mild intermittent asthma, uncomplicated: Secondary | ICD-10-CM | POA: Diagnosis not present

## 2016-06-03 DIAGNOSIS — E782 Mixed hyperlipidemia: Secondary | ICD-10-CM | POA: Diagnosis not present

## 2016-06-03 DIAGNOSIS — E039 Hypothyroidism, unspecified: Secondary | ICD-10-CM | POA: Diagnosis not present

## 2016-06-03 DIAGNOSIS — Z0000 Encounter for general adult medical examination without abnormal findings: Secondary | ICD-10-CM | POA: Diagnosis not present

## 2016-06-11 DIAGNOSIS — J4520 Mild intermittent asthma, uncomplicated: Secondary | ICD-10-CM | POA: Diagnosis not present

## 2016-06-11 DIAGNOSIS — E782 Mixed hyperlipidemia: Secondary | ICD-10-CM | POA: Diagnosis not present

## 2016-06-11 DIAGNOSIS — E039 Hypothyroidism, unspecified: Secondary | ICD-10-CM | POA: Diagnosis not present

## 2016-06-11 DIAGNOSIS — I10 Essential (primary) hypertension: Secondary | ICD-10-CM | POA: Diagnosis not present

## 2016-06-22 DIAGNOSIS — R310 Gross hematuria: Secondary | ICD-10-CM | POA: Diagnosis not present

## 2016-06-23 ENCOUNTER — Encounter: Payer: Self-pay | Admitting: Diagnostic Neuroimaging

## 2016-06-23 ENCOUNTER — Ambulatory Visit (INDEPENDENT_AMBULATORY_CARE_PROVIDER_SITE_OTHER): Payer: Medicare Other | Admitting: Diagnostic Neuroimaging

## 2016-06-23 VITALS — BP 125/74 | HR 67 | Wt 140.8 lb

## 2016-06-23 DIAGNOSIS — G959 Disease of spinal cord, unspecified: Secondary | ICD-10-CM

## 2016-06-23 DIAGNOSIS — G250 Essential tremor: Secondary | ICD-10-CM | POA: Diagnosis not present

## 2016-06-23 DIAGNOSIS — G20 Parkinson's disease: Secondary | ICD-10-CM

## 2016-06-23 DIAGNOSIS — M5416 Radiculopathy, lumbar region: Secondary | ICD-10-CM

## 2016-06-23 MED ORDER — CARBIDOPA-LEVODOPA 25-100 MG PO TABS: 1.0000 | ORAL_TABLET | Freq: Three times a day (TID) | ORAL | 6 refills | Status: DC

## 2016-06-23 NOTE — Patient Instructions (Signed)
-   continue propranolol for essential tremor and BP control  - start carbidopa/levodopa for parkinsonism tremor (half tab three times per day x 2 weeks then increase to 1 tab three time per day with meals)

## 2016-06-23 NOTE — Progress Notes (Signed)
[facility]  PATIENT: Jeanette [name] DOB: Mar 16, 1946  REFERRING CLINICIAN: Ashby Dawes HISTORY FROM: patient  REASON FOR VISIT: follow up    HISTORICAL  CHIEF COMPLAINT:  Chief Complaint  Patient presents with  . Essential tremor    rm 7, "no change in tremors"  . Follow-up    2 mos, post MRI    HISTORY OF PRESENT ILLNESS:   UPDATE 06/23/16: Since last visit no change in sxs. MRI reviewed. Ready to start carb/levo.   PRIOR HPI (03/11/16): 71 year old female here for evaluation of tremor. Patient is right handed and has hypertension. For past 8-10 years, patient has had intermittent postural and action tremor. This is progressively worsened over time. More recently she has noticed subtle resting tremor. Patient is having more difficulty with holding objects, drinking from a cup, handwriting. Symptoms fluctuate from day to day. She has a glass of wine sometimes this calms down some of her tremor. Patient has been on propranolol for blood pressure control, and was advised to take an extra tablet of this to help with tremor control. However patient has not tried taking the extra propranolol. Patient has significant family history for tremor. Patient's mother had tremor but not specifically diagnosed. Patient's mother also had polycythemia vera and later leukemia. Patient's son has tremor starting at age 63 years old. Patient's maternal aunts 2 and maternal uncle 1 at Parkinson's disease. Several first cousins on her mother's side also have Parkinson's disease. A different maternal uncle had multiple sclerosis. Patient also has noted some hoarse voice, intermittent choking sensation, mild drooling from the right side of her mouth. No change in smell or taste. No significant anxiety or constipation. No leg tremor. She has some mild balance and walking difficulty.  Patient has had multiple cervical spine surgeries in 2003, 2007 in 2014. Patient had evidence of cervical  myelopathy at C7-T1. She is also had lumbar spine surgeries in 2001 and 2014. Patient had some complication of surgery including hoarse voice.  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 Conventional angiogram was performed which showed tortuous right ICA without evidence of dissection.   REVIEW OF SYSTEMS: Full 14 system review of systems performed and negative with exception of: Aching muscles snoring choking.  ALLERGIES: Allergies  Allergen Reactions  . Compazine [Prochlorperazine Edisylate] Swelling    Mouth and tongue swell, throat swells closed    HOME MEDICATIONS: Outpatient Medications Prior to Visit  Medication Sig Dispense Refill  . albuterol (PROVENTIL HFA;VENTOLIN HFA) 108 (90 BASE) MCG/ACT inhaler Inhale 2 puffs into the lungs every 6 (six) hours as needed for wheezing. Reported on 08/28/2015    . Calcium Carbonate-Vitamin D (CALCIUM + D PO) Take 1 tablet by mouth daily.     . Coenzyme Q10 (COQ10) 200 MG CAPS Take by mouth.    . Cyanocobalamin (B-12 SL) Place under the tongue.    Marland Kitchen losartan (COZAAR) 100 MG tablet Take 100 mg by mouth daily.    . Multiple Vitamin (MULTIVITAMIN) tablet Take 1 tablet by mouth daily.    . pravastatin (PRAVACHOL) 20 MG tablet 20 mg daily.    . propranolol ER (INDERAL LA) 80 MG 24 hr capsule Take 80 mg by mouth daily.    Marland Kitchen SYNTHROID 88 MCG tablet Take 88 mcg by mouth daily.     No facility-administered medications prior to visit.     PAST MEDICAL HISTORY: Past Medical History:  Diagnosis Date  . Arthritis   . Asthma    hx  . Hypertension   . Hypothyroidism   . Pneumonia    hx  . PONV (postoperative nausea and vomiting)    has scop patch to apply  10/13/12  . Stroke [facility]) 2011   tia's  . Thyroid disease   . TIA (transient ischemic attack)     PAST SURGICAL HISTORY: Past Surgical History:  Procedure Laterality Date  . ANTERIOR  CERVICAL DECOMP/DISCECTOMY FUSION N/A 10/14/2012   Procedure: ANTERIOR CERVICAL DECOMPRESSION/DISCECTOMY FUSION 1 LEVEL/HARDWARE REMOVAL;  Surgeon: Kristeen Miss, MD;  Location: [facility];  Service: Neurosurgery;  Laterality: N/A;  C7-T1 Anterior cervical decompression/diskectomy/fusion/removal of old synthes plate  . BACK SURGERY  01  . DILATION AND CURETTAGE OF UTERUS    . HYSTEROSCOPY  2004, 2007   endometrial polyp  . KNEE SURGERY Right    71 yrs old  . NECK SURGERY  07,03   cerv,Fusion  . ROTATOR CUFF REPAIR Right 2015  . TONSILLECTOMY      FAMILY HISTORY: Family History  Problem Relation Age of Onset  . Leukemia Mother   . Ovarian cancer Mother   . Tremor Mother   . Polycythemia Mother   . Hypertension Father   . Heart disease Father   . Parkinson's disease Maternal Aunt   . Stroke Maternal Aunt   . Parkinson's disease Maternal Uncle   . Multiple sclerosis Maternal Uncle   . Colon cancer Neg Hx     SOCIAL HISTORY:  Social History   Social History  . Marital status: Married    Spouse name: N/A  . Number of children: 2  . Years of education: 14   Occupational History  .      retired Insurance claims handler   Social History Main Topics  . Smoking status: Never Smoker  . Smokeless tobacco: Never Used  . Alcohol use 0.6 oz/week    1 Glasses of wine per week     Comment: wine 1-2 week  . Drug use: No  . Sexual activity: Yes    Birth control/ protection: Post-menopausal     Comment: 1st intercourse 2 yo-1 partner   Other Topics Concern  . Not on file   Social History Narrative   Lives with husband   Caffeine - coffee/tea, 1-2 cups daily     PHYSICAL EXAM  GENERAL EXAM/CONSTITUTIONAL: Vitals:  Vitals:   06/23/16 1545  BP: 125/74  Pulse: 67  Weight: 140 lb 12.8 oz (63.9 kg)   Body mass index is 27.5 kg/m. No exam data present  Patient is in no distress; well developed, nourished and groomed; neck is supple  CARDIOVASCULAR:  Examination of carotid  arteries is normal; no carotid bruits  Regular rate and rhythm, no murmurs  Examination of peripheral vascular system by observation and palpation is normal  EYES:  Ophthalmoscopic exam of optic discs and posterior segments is normal; no papilledema or hemorrhages  MUSCULOSKELETAL:  Gait, strength, tone, movements noted in Neurologic exam below  NEUROLOGIC: MENTAL STATUS:  No flowsheet data found.  awake, alert, oriented to person, place and time  recent and remote memory intact  normal attention and concentration  language fluent, comprehension intact, naming intact,   fund of knowledge appropriate  CRANIAL NERVE:   2nd - no papilledema on fundoscopic exam  2nd, 3rd, 4th, 6th - pupils equal and reactive to light, visual fields full to confrontation, extraocular muscles intact, no nystagmus  5th - facial sensation  symmetric  7th - facial strength symmetric  8th - hearing intact  9th - palate elevates symmetrically, uvula midline  11th - shoulder shrug symmetric  12th - tongue protrusion midline  SUBTLE HEAD AND VOICE TREMOR  SLIGHTLY HOARSE VOICE  MOTOR:   normal bulk, full strength in the BUE, BLE  MILD INCREASED TONE IN RUE > LUE  MODERATE POSTURAL AND ACTION TREMOR IN BUE  MILD RESTING TREMOR IN RUE > LUE; ESP WITH CONTRALATERAL RAPID ALTERNATING MOVEMENTS  MILD BRADYKINESIA IN RUE > LUE   SENSORY:   normal and symmetric to light touch, temperature, vibration  COORDINATION:   finger-nose-finger, fine finger movements normal  REFLEXES:   deep tendon reflexes present and symmetric  TRACE AT ANKLES  GAIT/STATION:   narrow based gait; CAUTIOUS GAIT; SHORT STEPS; DECR ARM SWING     DIAGNOSTIC DATA (LABS, IMAGING, TESTING) - I reviewed patient records, labs, notes, testing and imaging myself where available.  Lab Results  Component Value Date   WBC 7.9 05/28/2013   HGB 13.9 05/28/2013   HCT 41.0 05/28/2013   MCV 91.4 05/28/2013     PLT 352 05/28/2013      Component Value Date/Time   NA 143 05/28/2013 1317   K 4.0 05/28/2013 1317   CL 104 05/28/2013 1317   CO2 31 03/07/2013 0540   GLUCOSE 90 05/28/2013 1317   BUN 20 06/27/2015 1407   CREATININE 1.4 (H) 06/27/2015 1407   CREATININE 1.61 (H) 06/27/2015 1407   CALCIUM 8.2 (L) 03/07/2013 0540   PROT 7.1 12/30/2010 0935   ALBUMIN 3.9 12/30/2010 0935   AST 19 12/30/2010 0935   ALT 14 12/30/2010 0935   ALKPHOS 79 12/30/2010 0935   BILITOT 0.5 12/30/2010 0935   GFRNONAA 87 (L) 03/07/2013 0540   GFRAA >90 03/07/2013 0540   No results found for: CHOL, HDL, LDLCALC, LDLDIRECT, TRIG, CHOLHDL No results found for: HGBA1C No results found for: VITAMINB12 No results found for: TSH  10/01/12 MRI cervical spine [I reviewed images myself and agree with interpretation. -VRP]  - Fusion from C3-C7 has a satisfactory appearance.  Mild gliosis of the cord at the C4-5 level. - Progressive degenerative disease at C7-T1.  Advanced facet arthropathy with anterolisthesis of 5 mm.  Disc degeneration and bulging.  Effacement subarachnoid space.  Bilateral neural foraminal stenosis that could compress either or both C8 nerve roots.  This is more pronounced on the right.  Feb 17, 2013 MRI lumbar spine [I reviewed images myself and agree with interpretation. -VRP]  1. New broad-based disc protrusion at L1-2 with slight caudal down turning and mild to moderate mass effect on the thecal sac. 2. Stable moderate to moderately severe multifactorial spinal and bilateral lateral recess stenosis at L2-3. 3. Stable posterior and interbody fusion changes at L3-4 and L4-5.  05/28/13 CT neck  1. Focal tapering of the right internal carotid artery with decrease caliber of the distal ICA. 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 An acute dissection is also considered. 2. The vocal cords appear symmetric. An acute vascular event could be affecting function of the will vocal cords. 3.  Extensive cervical spine fusion.  05/28/13 carotid and vertebral angiogram - Angiographically no evidence of intimal flaps, intraluminal filling defects or stenosis to suggest dissection of the carotid artery systems bilaterally. - Mild prominence of the right internal jugular vein in the neck without stenoses. 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  03/20/16 MRI brain without contrast: 1.  The brain appears normal for age. 2.    As noted on the MRI from 02/26/2009, there is asymmetry of the ICA at the skull base, with right caliber smaller than the left. This was evaluated on CT angiography 05/28/2013 confirming the reduced caliber.  Consider follow-up evaluation if clinically indicated.. 3.    Chronic inflammatory changes involving the right maxillary sinus and adjacent nasopharynx. Although likely due to inflammatory changes, consider dedicated sinus imaging to evaluate to determine if there is erosion of the bones that could imply a more pathologic process. 4.    Degenerative changes at C2-C3 with apparent mild spinal stenosis.     ASSESSMENT AND PLAN  71 y.o. year old female here with postural and action tremor in upper extremities for past 8-10 years, now with increasing resting tremor, bradykinesia and rigidity. 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 Also with history of cervical myelopathy and lumbar radiculopathy.  Dx: essential tremor + parkinson's disease + h/o cervical myelopathy + h/o lumbar radiculopathy  1. Essential tremor   2. Parkinsonism, unspecified Parkinsonism type ([place])   3. Cervical myelopathy ([place])   4. Lumbar radiculopathy      PLAN:  - continue propranolol for essential tremor and BP control  - start carbidopa/levodopa for parkinsonism tremor (half tab three times per day x 2 weeks then increase to 1 tab three time per day with meals)  Meds ordered this encounter   Medications  . carbidopa-levodopa (SINEMET IR) 25-100 MG tablet    Sig: Take 1 tablet by mouth 3 (three) times daily before meals.    Dispense:  90 tablet    Refill:  6   Return in about 3 months (around 09/20/2016).    Penni Bombard, MD [id number], [id number] PM Certified in Neurology, Neurophysiology and Neuroimaging  [facility] [address] [place], [place] [address] [phone]

## 2016-06-30 DIAGNOSIS — S46011D Strain of muscle(s) and tendon(s) of the rotator cuff of right shoulder, subsequent encounter: Secondary | ICD-10-CM | POA: Diagnosis not present

## 2016-07-16 DIAGNOSIS — J324 Chronic pansinusitis: Secondary | ICD-10-CM | POA: Diagnosis not present

## 2016-07-16 DIAGNOSIS — J339 Nasal polyp, unspecified: Secondary | ICD-10-CM | POA: Diagnosis not present

## 2016-07-20 DIAGNOSIS — J328 Other chronic sinusitis: Secondary | ICD-10-CM | POA: Diagnosis not present

## 2016-07-20 DIAGNOSIS — J329 Chronic sinusitis, unspecified: Secondary | ICD-10-CM | POA: Diagnosis not present

## 2016-08-09 HISTORY — PX: NASAL SINUS SURGERY: SHX719

## 2016-08-11 DIAGNOSIS — H2513 Age-related nuclear cataract, bilateral: Secondary | ICD-10-CM | POA: Diagnosis not present

## 2016-08-18 ENCOUNTER — Other Ambulatory Visit: Payer: Self-pay | Admitting: Otolaryngology

## 2016-08-18 DIAGNOSIS — J320 Chronic maxillary sinusitis: Secondary | ICD-10-CM | POA: Diagnosis not present

## 2016-08-18 DIAGNOSIS — J322 Chronic ethmoidal sinusitis: Secondary | ICD-10-CM | POA: Diagnosis not present

## 2016-08-18 DIAGNOSIS — J019 Acute sinusitis, unspecified: Secondary | ICD-10-CM | POA: Diagnosis not present

## 2016-08-18 DIAGNOSIS — J324 Chronic pansinusitis: Secondary | ICD-10-CM | POA: Diagnosis not present

## 2016-08-18 DIAGNOSIS — J338 Other polyp of sinus: Secondary | ICD-10-CM | POA: Diagnosis not present

## 2016-08-24 DIAGNOSIS — J324 Chronic pansinusitis: Secondary | ICD-10-CM | POA: Diagnosis not present

## 2016-08-31 DIAGNOSIS — J324 Chronic pansinusitis: Secondary | ICD-10-CM | POA: Diagnosis not present

## 2016-09-24 DIAGNOSIS — J324 Chronic pansinusitis: Secondary | ICD-10-CM | POA: Diagnosis not present

## 2016-09-25 DIAGNOSIS — Z85828 Personal history of other malignant neoplasm of skin: Secondary | ICD-10-CM | POA: Diagnosis not present

## 2016-09-25 DIAGNOSIS — L821 Other seborrheic keratosis: Secondary | ICD-10-CM | POA: Diagnosis not present

## 2016-09-25 DIAGNOSIS — L570 Actinic keratosis: Secondary | ICD-10-CM | POA: Diagnosis not present

## 2016-09-25 DIAGNOSIS — L814 Other melanin hyperpigmentation: Secondary | ICD-10-CM | POA: Diagnosis not present

## 2016-09-25 DIAGNOSIS — D1801 Hemangioma of skin and subcutaneous tissue: Secondary | ICD-10-CM | POA: Diagnosis not present

## 2016-09-25 DIAGNOSIS — D225 Melanocytic nevi of trunk: Secondary | ICD-10-CM | POA: Diagnosis not present

## 2016-09-29 ENCOUNTER — Ambulatory Visit (INDEPENDENT_AMBULATORY_CARE_PROVIDER_SITE_OTHER): Payer: Medicare Other | Admitting: Diagnostic Neuroimaging

## 2016-09-29 ENCOUNTER — Encounter: Payer: Self-pay | Admitting: Diagnostic Neuroimaging

## 2016-09-29 ENCOUNTER — Encounter (INDEPENDENT_AMBULATORY_CARE_PROVIDER_SITE_OTHER): Payer: Self-pay

## 2016-09-29 VITALS — BP 121/75 | HR 59 | Wt 140.8 lb

## 2016-09-29 DIAGNOSIS — I6521 Occlusion and stenosis of right carotid artery: Secondary | ICD-10-CM | POA: Diagnosis not present

## 2016-09-29 DIAGNOSIS — G20 Parkinson's disease: Secondary | ICD-10-CM

## 2016-09-29 DIAGNOSIS — G250 Essential tremor: Secondary | ICD-10-CM

## 2016-09-29 MED ORDER — PROPRANOLOL HCL ER 80 MG PO CP24: 80.0000 mg | ORAL_CAPSULE | Freq: Every day | ORAL | 4 refills | Status: DC

## 2016-09-29 MED ORDER — CARBIDOPA-LEVODOPA 25-100 MG PO TABS: 1.0000 | ORAL_TABLET | Freq: Three times a day (TID) | ORAL | 4 refills | Status: DC

## 2016-09-29 NOTE — Progress Notes (Signed)
[facility]  PATIENT: Jeanette [name] DOB: 08-04-1945  REFERRING CLINICIAN: Ashby Dawes HISTORY FROM: patient  REASON FOR VISIT: follow up    HISTORICAL  CHIEF COMPLAINT:  Chief Complaint  Patient presents with  . Essential tremor    rm 7, " medication has been helpful with tremors"  . Follow-up    3 month    HISTORY OF PRESENT ILLNESS:   UPDATE 09/29/16: Since last visit, doing well. Carb/levo helping tremor control. Mainly taking twice a day. No other new symptoms.  UPDATE 06/23/16: Since last visit no change in sxs. MRI reviewed. Ready to start carb/levo.   PRIOR HPI (03/11/16): 71 year old female here for evaluation of tremor. Patient is right handed and has hypertension. For past 8-10 years, patient has had intermittent postural and action tremor. This is progressively worsened over time. More recently she has noticed subtle resting tremor. Patient is having more difficulty with holding objects, drinking from a cup, handwriting. Symptoms fluctuate from day to day. She has a glass of wine sometimes this calms down some of her tremor. Patient has been on propranolol for blood pressure control, and was advised to take an extra tablet of this to help with tremor control. However patient has not tried taking the extra propranolol. Patient has significant family history for tremor. Patient's mother had tremor but not specifically diagnosed. Patient's mother also had polycythemia vera and later leukemia. Patient's son has tremor starting at age 51 years old. Patient's maternal aunts 2 and maternal uncle 1 at Parkinson's disease. Several first cousins on her mother's side also have Parkinson's disease. A different maternal uncle had multiple sclerosis. Patient also has noted some hoarse voice, intermittent choking sensation, mild drooling from the right side of her mouth. No change in smell or taste. No significant anxiety or constipation. No leg tremor. She has some mild  balance and walking difficulty.  Patient has had multiple cervical spine surgeries in 2003, 2007 in 2014. 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 She is also had lumbar spine surgeries in 2001 and 2014. Patient had some complication of surgery including hoarse voice.  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 Conventional angiogram was performed which showed tortuous right ICA without evidence of dissection.   REVIEW OF SYSTEMS: Full 14 system review of systems performed and negative with exception of: tremors snoring.   ALLERGIES: Allergies  Allergen Reactions  . Compazine [Prochlorperazine Edisylate] Swelling    Mouth and tongue swell, throat swells closed    HOME MEDICATIONS: Outpatient Medications Prior to Visit  Medication Sig Dispense Refill  . albuterol (PROVENTIL HFA;VENTOLIN HFA) 108 (90 BASE) MCG/ACT inhaler Inhale 2 puffs into the lungs every 6 (six) hours as needed for wheezing. Reported on 08/28/2015    . Calcium Carbonate-Vitamin D (CALCIUM + D PO) Take 1 tablet by mouth daily.     . carbidopa-levodopa (SINEMET IR) 25-100 MG tablet Take 1 tablet by mouth 3 (three) times daily before meals. 90 tablet 6  . Coenzyme Q10 (COQ10) 200 MG CAPS Take by mouth.    . Cyanocobalamin (B-12 SL) Place under the tongue.    Marland Kitchen losartan (COZAAR) 100 MG tablet Take 100 mg by mouth daily.    . Multiple Vitamin (MULTIVITAMIN) tablet Take 1 tablet by mouth daily.    . pravastatin (PRAVACHOL) 20 MG tablet 20 mg daily.    . propranolol ER (INDERAL LA) 80 MG  24 hr capsule Take 80 mg by mouth daily.    Marland Kitchen SYNTHROID 88 MCG tablet Take 88 mcg by mouth daily.    Marland Kitchen venlafaxine XR (EFFEXOR-XR) 37.5 MG 24 hr capsule 37.5 mg.     No facility-administered medications prior to visit.     PAST MEDICAL HISTORY: Past Medical History:  Diagnosis Date  . Arthritis   . Asthma    hx  .  Hypertension   . Hypothyroidism   . Pneumonia    hx  . PONV (postoperative nausea and vomiting)    has scop patch to apply  10/13/12  . Stroke [facility]) 2011   tia's  . Thyroid disease   . TIA (transient ischemic attack)     PAST SURGICAL HISTORY: Past Surgical History:  Procedure Laterality Date  . ANTERIOR CERVICAL DECOMP/DISCECTOMY FUSION N/A 10/14/2012   Procedure: ANTERIOR CERVICAL DECOMPRESSION/DISCECTOMY FUSION 1 LEVEL/HARDWARE REMOVAL;  Surgeon: Kristeen Miss, MD;  Location: [facility];  Service: Neurosurgery;  Laterality: N/A;  C7-T1 Anterior cervical decompression/diskectomy/fusion/removal of old synthes plate  . BACK SURGERY  01  . DILATION AND CURETTAGE OF UTERUS    . HYSTEROSCOPY  2004, 2007   endometrial polyp  . KNEE SURGERY Right    71 yrs old  . NASAL SINUS SURGERY Right 08/2016   polyp removed  . NECK SURGERY  07,03   cerv,Fusion  . ROTATOR CUFF REPAIR Right 2015  . TONSILLECTOMY      FAMILY HISTORY: Family History  Problem Relation Age of Onset  . Leukemia Mother   . Ovarian cancer Mother   . Tremor Mother   . Polycythemia Mother   . Hypertension Father   . Heart disease Father   . Parkinson's disease Maternal Aunt   . Stroke Maternal Aunt   . Parkinson's disease Maternal Uncle   . Multiple sclerosis Maternal Uncle   . Colon cancer Neg Hx     SOCIAL HISTORY:  Social History   Social History  . Marital status: Married    Spouse name: N/A  . Number of children: 2  . Years of education: 14   Occupational History  .      retired Insurance claims handler   Social History Main Topics  . Smoking status: Never Smoker  . Smokeless tobacco: Never Used  . Alcohol use 0.6 oz/week    1 Glasses of wine per week     Comment: wine 1-2 week  . Drug use: No  . Sexual activity: Yes    Birth control/ protection: Post-menopausal     Comment: 1st intercourse 54 yo-1 partner   Other Topics Concern  . Not on file   Social History Narrative   Lives with husband    Caffeine - coffee/tea, 1-2 cups daily     PHYSICAL EXAM  GENERAL EXAM/CONSTITUTIONAL: Vitals:  Vitals:   09/29/16 0908  BP: 121/75  Pulse: (!) 59  Weight: 140 lb 12.8 oz (63.9 kg)   Body mass index is 27.5 kg/m. No exam data present  Patient is in no distress; well developed, nourished and groomed; neck is supple  CARDIOVASCULAR:  Examination of carotid arteries is normal; no carotid bruits  Regular rate and rhythm, no murmurs  Examination of peripheral vascular system by observation and palpation is normal  EYES:  Ophthalmoscopic exam of optic discs and posterior segments is normal; no papilledema or hemorrhages  MUSCULOSKELETAL:  Gait, strength, tone, movements noted in Neurologic exam below  NEUROLOGIC: MENTAL STATUS:  No flowsheet data found.  awake,  alert, oriented to person, place and time  recent and remote memory intact  normal attention and concentration  language fluent, comprehension intact, naming intact,   fund of knowledge appropriate  CRANIAL NERVE:   2nd - no papilledema on fundoscopic exam  2nd, 3rd, 4th, 6th - pupils equal and reactive to light, visual fields full to confrontation, extraocular muscles intact, no nystagmus  5th - facial sensation symmetric  7th - facial strength symmetric  8th - hearing intact  9th - palate elevates symmetrically, uvula midline  11th - shoulder shrug symmetric  12th - tongue protrusion midline  SUBTLE HEAD AND VOICE TREMOR  SLIGHTLY HOARSE VOICE  MOTOR:   normal bulk, full strength in the BUE, BLE  NORMAL TONE IN BUE AND BLE  MILD POSTURAL AND ACTION TREMOR IN BUE  MILD RESTING TREMOR IN RUE > LUE; ESP WITH CONTRALATERAL RAPID ALTERNATING MOVEMENTS  MODERATE BRADYKINESIA IN RUE > LUE  MILD BRADYKINESIA IN BLE   SENSORY:   normal and symmetric to light touch, temperature, vibration  COORDINATION:   finger-nose-finger, fine finger movements normal  REFLEXES:   deep tendon  reflexes present and symmetric  TRACE AT ANKLES  GAIT/STATION:   narrow based gait; CAUTIOUS GAIT; SHORT STEPS; DECR ARM SWING     DIAGNOSTIC DATA (LABS, IMAGING, TESTING) - I reviewed patient records, labs, notes, testing and imaging myself where available.  Lab Results  Component Value Date   WBC 7.9 05/28/2013   HGB 13.9 05/28/2013   HCT 41.0 05/28/2013   MCV 91.4 05/28/2013   PLT 352 05/28/2013      Component Value Date/Time   NA 143 05/28/2013 1317   K 4.0 05/28/2013 1317   CL 104 05/28/2013 1317   CO2 31 03/07/2013 0540   GLUCOSE 90 05/28/2013 1317   BUN 20 06/27/2015 1407   CREATININE 1.4 (H) 06/27/2015 1407   CREATININE 1.61 (H) 06/27/2015 1407   CALCIUM 8.2 (L) 03/07/2013 0540   PROT 7.1 12/30/2010 0935   ALBUMIN 3.9 12/30/2010 0935   AST 19 12/30/2010 0935   ALT 14 12/30/2010 0935   ALKPHOS 79 12/30/2010 0935   BILITOT 0.5 12/30/2010 0935   GFRNONAA 87 (L) 03/07/2013 0540   GFRAA >90 03/07/2013 0540   No results found for: CHOL, HDL, LDLCALC, LDLDIRECT, TRIG, CHOLHDL No results found for: HGBA1C No results found for: VITAMINB12 No results found for: TSH  10/01/12 MRI cervical spine [I reviewed images myself and agree with interpretation. -VRP]  - Fusion from C3-C7 has a satisfactory appearance.  Mild gliosis of the cord at the C4-5 level. - Progressive degenerative disease at C7-T1.  Advanced facet arthropathy with anterolisthesis of 5 mm.  Disc degeneration and bulging.  Effacement subarachnoid space.  Bilateral neural foraminal stenosis that could compress either or both C8 nerve roots.  This is more pronounced on the right.  Feb 25, 2013 MRI lumbar spine [I reviewed images myself and agree with interpretation. -VRP]  1. New broad-based disc protrusion at L1-2 with slight caudal down turning and mild to moderate mass effect on the thecal sac. 2. Stable moderate to moderately severe multifactorial spinal and bilateral lateral recess stenosis at L2-3. 3.  Stable posterior and interbody fusion changes at L3-4 and L4-5.  05/28/13 CT neck  1. Focal tapering of the right internal carotid artery with decrease caliber of the distal ICA. 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 An acute dissection is also considered. 2. The vocal cords appear symmetric. An acute vascular event could be  affecting function of the will vocal cords. 3. Extensive cervical spine fusion.  05/28/13 carotid and vertebral angiogram - Angiographically no evidence of intimal flaps, intraluminal filling defects or stenosis to suggest dissection of the carotid artery systems bilaterally. - Mild prominence of the right internal jugular vein in the neck without stenoses. 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  03/20/16 MRI brain [I reviewed images myself and agree with interpretation. There is possible decreased caliber of right ICA on T1 views, but not definitely on T2 views. -VRP]  1.   The brain appears normal for age. 2.    As noted on the MRI from 02/26/2009, there is asymmetry of the ICA at the skull base, with right caliber smaller than the left. This was evaluated on CT angiography 05/28/2013 confirming the reduced caliber.  Consider follow-up evaluation if clinically indicated. 3.    Chronic inflammatory changes involving the right maxillary sinus and adjacent nasopharynx. Although likely due to inflammatory changes, consider dedicated sinus imaging to evaluate to determine if there is erosion of the bones that could imply a more pathologic process. 4.    Degenerative changes at C2-C3 with apparent mild spinal stenosis.     ASSESSMENT AND PLAN  71 y.o. year old female here with postural and action tremor in upper extremities for past 8-10 years, now with increasing resting tremor, bradykinesia and rigidity. 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 Also with history of  cervical myelopathy and lumbar radiculopathy.  Dx: essential tremor + parkinson's disease + h/o cervical myelopathy + h/o lumbar radiculopathy  1. Parkinsonism, unspecified Parkinsonism type ([place])   2. Essential tremor   3. Internal carotid artery stenosis, right      PLAN:  I spent 25 minutes of face to face time with patient. Greater than 50% of time was spent in counseling and coordination of care with patient. In summary we discussed:   ESSENTIAL TREMOR - continue propranolol for essential tremor and BP control  PARKINSONISM - continue carbidopa/levodopa for parkinsonism tremor (half tab three times per day x 2 weeks then increase to 1 tab three time per day with meals)  RIGHT ICA NARROWING / STENOSIS - check CTA head/neck to follow up right ICA stenosis / narrowing  Meds ordered this encounter  Medications  . propranolol ER (INDERAL LA) 80 MG 24 hr capsule    Sig: Take 1 capsule (80 mg total) by mouth daily.    Dispense:  90 capsule    Refill:  4  . carbidopa-levodopa (SINEMET IR) 25-100 MG tablet    Sig: Take 1 tablet by mouth 3 (three) times daily before meals.    Dispense:  270 tablet    Refill:  4   Orders Placed This Encounter  Procedures  . CT ANGIO HEAD W OR WO CONTRAST  . CT ANGIO NECK W OR WO CONTRAST   Return in about 6 months (around 04/01/2017).    Penni Bombard, MD 8/67/6195, 0:93 AM Certified in Neurology, Neurophysiology and Neuroimaging  [facility] [address] [place], [place] [address] [phone]

## 2016-10-13 ENCOUNTER — Ambulatory Visit
Admission: RE | Admit: 2016-10-13 | Discharge: 2016-10-13 | Disposition: A | Discharge status: Home / Self Care | Payer: Medicare Other | Source: Ambulatory Visit | Attending: Diagnostic Neuroimaging | Admitting: Diagnostic Neuroimaging

## 2016-10-13 DIAGNOSIS — J320 Chronic maxillary sinusitis: Secondary | ICD-10-CM | POA: Diagnosis not present

## 2016-10-13 DIAGNOSIS — I6521 Occlusion and stenosis of right carotid artery: Secondary | ICD-10-CM

## 2016-10-13 DIAGNOSIS — M4322 Fusion of spine, cervical region: Secondary | ICD-10-CM | POA: Diagnosis not present

## 2016-10-13 MED ORDER — IOPAMIDOL (ISOVUE-300) INJECTION 61%
100.0000 mL | Freq: Once | INTRAVENOUS | Status: AC | PRN
  Administered 2016-10-13: 75 mL via INTRAVENOUS

## 2016-10-16 ENCOUNTER — Telehealth: Payer: Self-pay | Admitting: *Deleted

## 2016-10-16 NOTE — Telephone Encounter (Signed)
LVM informing patient her CT angio of neck results are unremarkable. Advised her there is no major narrowing of blood vessels.  Dr Leta Baptist will continue current plan of tremor and BP control.  Left number for any questions, advised the office closes at noon today.

## 2016-12-01 DIAGNOSIS — L821 Other seborrheic keratosis: Secondary | ICD-10-CM | POA: Diagnosis not present

## 2016-12-01 DIAGNOSIS — S40262A Insect bite (nonvenomous) of left shoulder, initial encounter: Secondary | ICD-10-CM | POA: Diagnosis not present

## 2016-12-01 DIAGNOSIS — Z85828 Personal history of other malignant neoplasm of skin: Secondary | ICD-10-CM | POA: Diagnosis not present

## 2016-12-02 DIAGNOSIS — J324 Chronic pansinusitis: Secondary | ICD-10-CM | POA: Diagnosis not present

## 2016-12-24 ENCOUNTER — Ambulatory Visit (INDEPENDENT_AMBULATORY_CARE_PROVIDER_SITE_OTHER): Payer: Medicare Other | Admitting: Gynecology

## 2016-12-24 ENCOUNTER — Encounter: Payer: Self-pay | Admitting: Gynecology

## 2016-12-24 VITALS — BP 120/72 | Ht 59.0 in | Wt 141.0 lb

## 2016-12-24 DIAGNOSIS — Z01411 Encounter for gynecological examination (general) (routine) with abnormal findings: Secondary | ICD-10-CM | POA: Diagnosis not present

## 2016-12-24 DIAGNOSIS — M8580 Other specified disorders of bone density and structure, unspecified site: Secondary | ICD-10-CM

## 2016-12-24 DIAGNOSIS — N952 Postmenopausal atrophic vaginitis: Secondary | ICD-10-CM

## 2016-12-24 NOTE — Patient Instructions (Signed)
Follow up for CT scan as arranged by office

## 2016-12-24 NOTE — Progress Notes (Signed)
    Jeanette [name] [birth year] [id number]        71 y.o.  [id number] for breast and pelvic exam.  Past medical history,surgical history, problem list, medications, allergies, family history and social history were all reviewed and documented as reviewed in the EPIC chart.  ROS:  Performed with pertinent positives and negatives included in the history, assessment and plan.   Additional significant findings :  None   Exam: [name] assistant Vitals:   12/24/16 1200  BP: 120/72  Weight: 141 lb (64 kg)  Height: 4\' 11"  (1.499 m)   Body mass index is 28.48 kg/m.  General appearance:  Normal affect, orientation and appearance. Skin: Grossly normal HEENT: Without gross lesions.  No cervical or supraclavicular adenopathy. Thyroid normal.  Lungs:  Clear without wheezing, rales or rhonchi Cardiac: RR, without RMG Abdominal:  Soft, nontender, without masses, guarding, rebound, organomegaly or hernia Breasts:  Examined lying and sitting without masses, retractions, discharge or axillary adenopathy. Pelvic:  Ext, BUS, Vagina: With atrophic changes  Cervix: With atrophic changes  Uterus: Anteverted, normal size, shape and contour, midline and mobile nontender   Adnexa: Without masses or tenderness    Anus and perineum: Normal   Rectovaginal: Normal sphincter tone without palpated masses or tenderness.    Assessment/Plan:  71 y.o. [id number] female for breast and pelvic exam.   1. Postmenopausal/atrophic genital changes. No significant hot flushes, night sweats, vaginal dryness or any vaginal bleeding. Continue to monitor report any issues or bleeding. 2. History of left lower quadrant discomfort. Had negative GYN ultrasound last year. Did have some shadowing in the left pelvis. Follow up CT scan was negative but they did note 2 small areas in the liver and kidney. They recommended a 6 month follow up CT scan of the kidney. Patient was notified but never followed up at 6 months. I discussed all  this with the patient and my recommendation is to go ahead and do these CT scan with and without contrast as recommended by radiology. Patient agrees to do this. She knows to call my office if she does not hear about the arrangements within the next 1-2 weeks. She did have a colonoscopy last year which was normal. 3. Pap smear 2017. No Pap smear done today. Options to stop screening per current screening guidelines based on age discussed. 4. Mammography 08/2016. Continue with annual mammography when due. Breast exam normal today. 5. History of osteopenia. Had DEXA 2016 at her primary physician's office. She'll continue to follow up with them in reference to this. 6. Health maintenance. No routine lab work done as patient does this elsewhere. Follow up 1 year, sooner as needed.   Anastasio Auerbach MD, 12:29 PM 12/24/2016

## 2016-12-28 ENCOUNTER — Telehealth: Payer: Self-pay | Admitting: *Deleted

## 2016-12-28 DIAGNOSIS — N289 Disorder of kidney and ureter, unspecified: Secondary | ICD-10-CM

## 2016-12-28 NOTE — Telephone Encounter (Signed)
-----   Message from Anastasio Auerbach, MD sent at 12/24/2016 12:32 PM EDT ----- Schedule abdominal CT scan with and without contrast reference follow up on CT scan done 06/2015 where hepatic and renal lesions noted. Follow up CT scan was recommended for renal lesion at 6 month interval.

## 2016-12-28 NOTE — Telephone Encounter (Signed)
Pt scheduled on 01/05/17 @ 11:00am at [facility] 1st floor radiology will need to pick up contrast 1 day before scan, liquids only 4 hours prior to scan.  Needs BUN/creatine drawn prior to scan can have done same day at 1st floor radiology as well. Per tech only CT abdominal with contrast needed, not w/wo. Pt informed with time and date.

## 2016-12-30 NOTE — Addendum Note (Signed)
Addended by: Thamas Jaegers on: 12/30/2016 09:59 AM   Modules accepted: Orders

## 2016-12-30 NOTE — Telephone Encounter (Signed)
spoke with Katharine Look in [place] as was told pt will need to have I-Stat creatine level drawn order can be faxed to [phone]. pt will have drawn before scan at [facility]. Was informed to cancel orders in epic for BUN and creatine. Order faxed.

## 2017-01-05 ENCOUNTER — Ambulatory Visit (HOSPITAL_COMMUNITY)
Admission: RE | Admit: 2017-01-05 | Discharge: 2017-01-05 | Disposition: A | Discharge status: Home / Self Care | Payer: Medicare Other | Source: Ambulatory Visit | Attending: Gynecology | Admitting: Gynecology

## 2017-01-05 DIAGNOSIS — N281 Cyst of kidney, acquired: Secondary | ICD-10-CM | POA: Diagnosis not present

## 2017-01-05 DIAGNOSIS — N289 Disorder of kidney and ureter, unspecified: Secondary | ICD-10-CM

## 2017-01-05 LAB — POCT I-STAT CREATININE: CREATININE: 0.8 mg/dL (ref 0.44–1.00)

## 2017-01-05 MED ORDER — IOPAMIDOL (ISOVUE-300) INJECTION 61%
INTRAVENOUS | Status: AC
  Filled 2017-01-05: qty 100

## 2017-01-05 MED ORDER — IOPAMIDOL (ISOVUE-300) INJECTION 61%
100.0000 mL | Freq: Once | INTRAVENOUS | Status: AC | PRN
  Administered 2017-01-05: 80 mL via INTRAVENOUS

## 2017-01-07 ENCOUNTER — Telehealth: Payer: Self-pay | Admitting: *Deleted

## 2017-01-07 NOTE — Telephone Encounter (Signed)
Tell patient the area in the kidney remains small. There is a question as to whether got a little bit bigger. I don't think this is a serious issue. The radiologist recommended a follow up study in 6 months. I'm not sure how aggressive we need to be following this and my recommendation is that she make an appointment with a urologist who deals with kidney issues and let them review all of this and recommend how to proceed as they are expert in this area. Arrange appointment with a urologist. They will have access to the CT studies to look at.   Notes faxed to [facility] they will contact pt to schedule and fax back with time and date.

## 2017-01-13 NOTE — Telephone Encounter (Signed)
Appointment on 01/14/17 @ 9:30am with Lossie Faes NP

## 2017-01-14 DIAGNOSIS — D3001 Benign neoplasm of right kidney: Secondary | ICD-10-CM | POA: Diagnosis not present

## 2017-01-21 DIAGNOSIS — E782 Mixed hyperlipidemia: Secondary | ICD-10-CM | POA: Diagnosis not present

## 2017-01-21 DIAGNOSIS — E039 Hypothyroidism, unspecified: Secondary | ICD-10-CM | POA: Diagnosis not present

## 2017-01-27 DIAGNOSIS — M2041 Other hammer toe(s) (acquired), right foot: Secondary | ICD-10-CM | POA: Diagnosis not present

## 2017-01-28 DIAGNOSIS — M5136 Other intervertebral disc degeneration, lumbar region: Secondary | ICD-10-CM | POA: Diagnosis not present

## 2017-01-28 DIAGNOSIS — J4520 Mild intermittent asthma, uncomplicated: Secondary | ICD-10-CM | POA: Diagnosis not present

## 2017-01-28 DIAGNOSIS — E039 Hypothyroidism, unspecified: Secondary | ICD-10-CM | POA: Diagnosis not present

## 2017-01-28 DIAGNOSIS — E782 Mixed hyperlipidemia: Secondary | ICD-10-CM | POA: Diagnosis not present

## 2017-01-28 DIAGNOSIS — Z23 Encounter for immunization: Secondary | ICD-10-CM | POA: Diagnosis not present

## 2017-02-01 DIAGNOSIS — Z1231 Encounter for screening mammogram for malignant neoplasm of breast: Secondary | ICD-10-CM | POA: Diagnosis not present

## 2017-02-22 DIAGNOSIS — Z981 Arthrodesis status: Secondary | ICD-10-CM | POA: Diagnosis not present

## 2017-02-22 DIAGNOSIS — M4316 Spondylolisthesis, lumbar region: Secondary | ICD-10-CM | POA: Diagnosis not present

## 2017-02-22 DIAGNOSIS — M5137 Other intervertebral disc degeneration, lumbosacral region: Secondary | ICD-10-CM | POA: Diagnosis not present

## 2017-02-26 DIAGNOSIS — M2011 Hallux valgus (acquired), right foot: Secondary | ICD-10-CM | POA: Diagnosis not present

## 2017-02-26 DIAGNOSIS — M659 Synovitis and tenosynovitis, unspecified: Secondary | ICD-10-CM | POA: Diagnosis not present

## 2017-02-26 DIAGNOSIS — M2041 Other hammer toe(s) (acquired), right foot: Secondary | ICD-10-CM | POA: Diagnosis not present

## 2017-03-03 ENCOUNTER — Other Ambulatory Visit: Payer: Self-pay | Admitting: Internal Medicine

## 2017-03-03 ENCOUNTER — Ambulatory Visit
Admission: RE | Admit: 2017-03-03 | Discharge: 2017-03-03 | Disposition: A | Discharge status: Home / Self Care | Payer: Medicare Other | Source: Ambulatory Visit | Attending: Internal Medicine | Admitting: Internal Medicine

## 2017-03-03 DIAGNOSIS — M25461 Effusion, right knee: Secondary | ICD-10-CM | POA: Diagnosis not present

## 2017-03-03 DIAGNOSIS — M79604 Pain in right leg: Secondary | ICD-10-CM

## 2017-03-03 DIAGNOSIS — M7121 Synovial cyst of popliteal space [Baker], right knee: Secondary | ICD-10-CM | POA: Diagnosis not present

## 2017-03-19 DIAGNOSIS — M25461 Effusion, right knee: Secondary | ICD-10-CM | POA: Diagnosis not present

## 2017-04-05 ENCOUNTER — Ambulatory Visit (INDEPENDENT_AMBULATORY_CARE_PROVIDER_SITE_OTHER): Payer: Medicare Other | Admitting: Diagnostic Neuroimaging

## 2017-04-05 ENCOUNTER — Encounter: Payer: Self-pay | Admitting: Diagnostic Neuroimaging

## 2017-04-05 VITALS — BP 125/75 | HR 61 | Ht 59.0 in | Wt 142.0 lb

## 2017-04-05 DIAGNOSIS — G20 Parkinson's disease: Secondary | ICD-10-CM | POA: Diagnosis not present

## 2017-04-05 DIAGNOSIS — I6521 Occlusion and stenosis of right carotid artery: Secondary | ICD-10-CM | POA: Diagnosis not present

## 2017-04-05 DIAGNOSIS — G250 Essential tremor: Secondary | ICD-10-CM

## 2017-04-05 DIAGNOSIS — M5416 Radiculopathy, lumbar region: Secondary | ICD-10-CM

## 2017-04-05 DIAGNOSIS — G959 Disease of spinal cord, unspecified: Secondary | ICD-10-CM | POA: Diagnosis not present

## 2017-04-05 NOTE — Patient Instructions (Signed)
Continue current medications. 

## 2017-04-05 NOTE — Progress Notes (Signed)
[facility]  PATIENT: Jeanette [name] DOB: May 06, 1946  REFERRING CLINICIAN: Ashby Dawes HISTORY FROM: patient  REASON FOR VISIT: follow up    HISTORICAL  CHIEF COMPLAINT:  Chief Complaint  Patient presents with  . Follow-up  . Tremors/ PD    notices improvement with taking the sinemet (she notes at times she will only take 2 times daily as she is out running around).      HISTORY OF PRESENT ILLNESS:   UPDATE (04/05/17, VRP): Since last visit, doing well. Tolerating meds. No alleviating or aggravating factors. Carb/levo is helping. Patient taking twice a day.   UPDATE 09/29/16: Since last visit, doing well. Carb/levo helping tremor control. Mainly taking twice a day. No other new symptoms.  UPDATE 06/23/16: Since last visit no change in sxs. MRI reviewed. Ready to start carb/levo.   PRIOR HPI (03/11/16): 71 year old female here for evaluation of tremor. Patient is right handed and has hypertension. For past 8-10 years, patient has had intermittent postural and action tremor. This is progressively worsened over time. More recently she has noticed subtle resting tremor. Patient is having more difficulty with holding objects, drinking from a cup, handwriting. Symptoms fluctuate from day to day. She has a glass of wine sometimes this calms down some of her tremor. Patient has been on propranolol for blood pressure control, and was advised to take an extra tablet of this to help with tremor control. However patient has not tried taking the extra propranolol. Patient has significant family history for tremor. Patient's mother had tremor but not specifically diagnosed. Patient's mother also had polycythemia vera and later leukemia. Patient's son has tremor starting at age 3 years old. Patient's maternal aunts 2 and maternal uncle 1 at Parkinson's disease. Several first cousins on her mother's side also have Parkinson's disease. A different maternal uncle had multiple  sclerosis. Patient also has noted some hoarse voice, intermittent choking sensation, mild drooling from the right side of her mouth. No change in smell or taste. No significant anxiety or constipation. No leg tremor. She has some mild balance and walking difficulty.  Patient has had multiple cervical spine surgeries in 2003, 2007 in 2014. 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 She is also had lumbar spine surgeries in 2001 and 2014. Patient had some complication of surgery including hoarse voice.  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 Conventional angiogram was performed which showed tortuous right ICA without evidence of dissection.   REVIEW OF SYSTEMS: Full 14 system review of systems performed and negative with exception of: tremors.   ALLERGIES: Allergies  Allergen Reactions  . Compazine [Prochlorperazine Edisylate] Swelling    Mouth and tongue swell, throat swells closed    HOME MEDICATIONS: Outpatient Medications Prior to Visit  Medication Sig Dispense Refill  . albuterol (PROVENTIL HFA;VENTOLIN HFA) 108 (90 BASE) MCG/ACT inhaler Inhale 2 puffs into the lungs every 6 (six) hours as needed for wheezing. Reported on 08/28/2015    . Calcium Carbonate-Vitamin D (CALCIUM + D PO) Take 1 tablet by mouth daily.     . carbidopa-levodopa (SINEMET IR) 25-100 MG tablet Take 1 tablet by mouth 3 (three) times daily.    . Coenzyme Q10 (COQ10) 200 MG CAPS Take by mouth.    . Cyanocobalamin (B-12 SL) Place under the tongue.    Marland Kitchen losartan (COZAAR) 100 MG tablet Take 100 mg by mouth daily.    Marland Kitchen  pravastatin (PRAVACHOL) 20 MG tablet 20 mg daily.    . propranolol ER (INDERAL LA) 80 MG 24 hr capsule Take 1 capsule (80 mg total) by mouth daily. 90 capsule 4  . SYNTHROID 88 MCG tablet Take 88 mcg by mouth daily.    Marland Kitchen venlafaxine XR (EFFEXOR-XR) 37.5 MG 24 hr capsule 37.5 mg.     No  facility-administered medications prior to visit.     PAST MEDICAL HISTORY: Past Medical History:  Diagnosis Date  . Arthritis   . Asthma    hx  . Hypertension   . Hypothyroidism   . Pneumonia    hx  . PONV (postoperative nausea and vomiting)    has scop patch to apply  10/13/12  . Stroke [facility]) 2011   tia's  . Thyroid disease   . TIA (transient ischemic attack)     PAST SURGICAL HISTORY: Past Surgical History:  Procedure Laterality Date  . ANTERIOR CERVICAL DECOMP/DISCECTOMY FUSION N/A 10/14/2012   Procedure: ANTERIOR CERVICAL DECOMPRESSION/DISCECTOMY FUSION 1 LEVEL/HARDWARE REMOVAL;  Surgeon: Kristeen Miss, MD;  Location: [facility];  Service: Neurosurgery;  Laterality: N/A;  C7-T1 Anterior cervical decompression/diskectomy/fusion/removal of old synthes plate  . BACK SURGERY  01  . DILATION AND CURETTAGE OF UTERUS    . HYSTEROSCOPY  2004, 2007   endometrial polyp  . KNEE SURGERY Right    71 yrs old  . NASAL SINUS SURGERY Right 08/2016   polyp removed  . NECK SURGERY  07,03   cerv,Fusion  . ROTATOR CUFF REPAIR Right 2015  . TONSILLECTOMY      FAMILY HISTORY: Family History  Problem Relation Age of Onset  . Leukemia Mother   . Ovarian cancer Mother   . Tremor Mother   . Polycythemia Mother   . Hypertension Father   . Heart disease Father   . Parkinson's disease Maternal Aunt   . Stroke Maternal Aunt   . Parkinson's disease Maternal Uncle   . Multiple sclerosis Maternal Uncle   . Colon cancer Neg Hx     SOCIAL HISTORY:  Social History   Socioeconomic History  . Marital status: Married    Spouse name: Not on file  . Number of children: 2  . Years of education: 74  . Highest education level: Not on file  Social Needs  . Financial resource strain: Not on file  . Food insecurity - worry: Not on file  . Food insecurity - inability: Not on file  . Transportation needs - medical: Not on file  . Transportation needs - non-medical: Not on file  Occupational  History    Comment: retired Insurance claims handler  Tobacco Use  . Smoking status: Never Smoker  . Smokeless tobacco: Never Used  Substance and Sexual Activity  . Alcohol use: Yes    Alcohol/week: 0.6 oz    Types: 1 Glasses of wine per week    Comment: wine 1-2 week  . Drug use: No  . Sexual activity: Yes    Birth control/protection: Post-menopausal    Comment: 1st intercourse 23 yo-1 partner  Other Topics Concern  . Not on file  Social History Narrative   Lives with husband   Caffeine - coffee/tea, 1-2 cups daily     PHYSICAL EXAM  GENERAL EXAM/CONSTITUTIONAL: Vitals:  Vitals:   04/05/17 0926  BP: 125/75  Pulse: 61  Weight: 142 lb (64.4 kg)  Height: 4\' 11"  (1.499 m)   Body mass index is 28.68 kg/m. No exam data present  Patient is in  no distress; well developed, nourished and groomed; neck is supple  CARDIOVASCULAR:  Examination of carotid arteries is normal; no carotid bruits  Regular rate and rhythm, no murmurs  Examination of peripheral vascular system by observation and palpation is normal  EYES:  Ophthalmoscopic exam of optic discs and posterior segments is normal; no papilledema or hemorrhages  MUSCULOSKELETAL:  Gait, strength, tone, movements noted in Neurologic exam below  NEUROLOGIC: MENTAL STATUS:  No flowsheet data found.  awake, alert, oriented to person, place and time  recent and remote memory intact  normal attention and concentration  language fluent, comprehension intact, naming intact,   fund of knowledge appropriate  CRANIAL NERVE:   2nd - no papilledema on fundoscopic exam  2nd, 3rd, 4th, 6th - pupils equal and reactive to light, visual fields full to confrontation, extraocular muscles intact, no nystagmus  5th - facial sensation symmetric  7th - facial strength symmetric  8th - hearing intact  9th - palate elevates symmetrically, uvula midline  11th - shoulder shrug symmetric  12th - tongue protrusion midline  SUBTLE  HEAD AND VOICE TREMOR  SLIGHTLY HOARSE VOICE  MOTOR:   normal bulk, full strength in the BUE, BLE  NORMAL TONE IN BUE AND BLE  MILD POSTURAL AND ACTION TREMOR IN BUE  MINIMAL RESTING TREMOR IN RUE > LUE; ESP WITH CONTRALATERAL RAPID ALTERNATING MOVEMENTS  MODERATE BRADYKINESIA IN LUE > RUE  MILD BRADYKINESIA IN BLE   SENSORY:   normal and symmetric to light touch, temperature, vibration  COORDINATION:   finger-nose-finger, fine finger movements normal  REFLEXES:   deep tendon reflexes present and symmetric  TRACE AT ANKLES  GAIT/STATION:   narrow based gait; CAUTIOUS GAIT; SHORT STEPS; DECR ARM SWING     DIAGNOSTIC DATA (LABS, IMAGING, TESTING) - I reviewed patient records, labs, notes, testing and imaging myself where available.  Lab Results  Component Value Date   WBC 7.9 05/28/2013   HGB 13.9 05/28/2013   HCT 41.0 05/28/2013   MCV 91.4 05/28/2013   PLT 352 05/28/2013      Component Value Date/Time   NA 143 05/28/2013 1317   K 4.0 05/28/2013 1317   CL 104 05/28/2013 1317   CO2 31 03/07/2013 0540   GLUCOSE 90 05/28/2013 1317   BUN 20 06/27/2015 1407   CREATININE 0.80 01/05/2017 1230   CREATININE 1.61 (H) 06/27/2015 1407   CALCIUM 8.2 (L) 03/07/2013 0540   PROT 7.1 12/30/2010 0935   ALBUMIN 3.9 12/30/2010 0935   AST 19 12/30/2010 0935   ALT 14 12/30/2010 0935   ALKPHOS 79 12/30/2010 0935   BILITOT 0.5 12/30/2010 0935   GFRNONAA 87 (L) 03/07/2013 0540   GFRAA >90 03/07/2013 0540   No results found for: CHOL, HDL, LDLCALC, LDLDIRECT, TRIG, CHOLHDL No results found for: HGBA1C No results found for: VITAMINB12 No results found for: TSH  10/01/12 MRI cervical spine [I reviewed images myself and agree with interpretation. -VRP]  - Fusion from C3-C7 has a satisfactory appearance.  Mild gliosis of the cord at the C4-5 level. - Progressive degenerative disease at C7-T1.  Advanced facet arthropathy with anterolisthesis of 5 mm.  Disc degeneration and  bulging.  Effacement subarachnoid space.  Bilateral neural foraminal stenosis that could compress either or both C8 nerve roots.  This is more pronounced on the right.  2013-02-03 MRI lumbar spine [I reviewed images myself and agree with interpretation. -VRP]  1. New broad-based disc protrusion at L1-2 with slight caudal down turning and mild  to moderate mass effect on the thecal sac. 2. Stable moderate to moderately severe multifactorial spinal and bilateral lateral recess stenosis at L2-3. 3. Stable posterior and interbody fusion changes at L3-4 and L4-5.  05/28/13 CT neck  1. Focal tapering of the right internal carotid artery with decrease caliber of the distal ICA. 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 An acute dissection is also considered. 2. The vocal cords appear symmetric. An acute vascular event could be affecting function of the will vocal cords. 3. Extensive cervical spine fusion.  05/28/13 carotid and vertebral angiogram - Angiographically no evidence of intimal flaps, intraluminal filling defects or stenosis to suggest dissection of the carotid artery systems bilaterally. - Mild prominence of the right internal jugular vein in the neck without stenoses. 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  03/20/16 MRI brain [I reviewed images myself and agree with interpretation. There is possible decreased caliber of right ICA on T1 views, but not definitely on T2 views. -VRP]  1.   The brain appears normal for age. 2.    As noted on the MRI from 02/26/2009, there is asymmetry of the ICA at the skull base, with right caliber smaller than the left. This was evaluated on CT angiography 05/28/2013 confirming the reduced caliber.  Consider follow-up evaluation if clinically indicated. 3.    Chronic inflammatory changes involving the right maxillary sinus and adjacent nasopharynx. Although likely due to inflammatory changes, consider  dedicated sinus imaging to evaluate to determine if there is erosion of the bones that could imply a more pathologic process. 4.    Degenerative changes at C2-C3 with apparent mild spinal stenosis.  10/13/16 CTA head / neck  1. Negative head and neck CTA aside from arterial tortuosity. Minimal intra- and extracranial atherosclerosis with no stenosis identified. The right ICA is mildly non-dominant due to dominance of the left ACA A1 segment, and there is chronic severe tortuosity of the cervical right ICA. 2.  Normal for age non contrast CT appearance of the brain. 3. Widespread chronic cervical spine fusion from the C3 to the C7 level. Sequelae of C7-T1 ACDF but pseudarthrosis at that level. 4. Chronic right maxillary sinusitis.     ASSESSMENT AND PLAN  71 y.o. year old female here with postural and action tremor in upper extremities for past 8-10 years, now with increasing resting tremor, bradykinesia and rigidity. 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 Also with history of cervical myelopathy and lumbar radiculopathy.  Dx: parkinson's disease + essential tremor + h/o cervical myelopathy + h/o lumbar radiculopathy  1. Parkinsonism, unspecified Parkinsonism type ([place])   2. Essential tremor   3. Cervical myelopathy ([place])   4. Lumbar radiculopathy      PLAN:  I spent 25 minutes of face to face time with patient. Greater than 50% of time was spent in counseling and coordination of care with patient. In summary we discussed:   PARKINSONISM - continue carbidopa/levodopa for parkinsonism tremor (1 tab twice a day; may increase to three times a day as needed)  ESSENTIAL TREMOR - continue propranolol for essential tremor and BP control  RIGHT ICA NARROWING / STENOSIS - improved on June 2017 CTA head/neck; monitor for now  Return in about 9 months (around 01/03/2018).    Penni Bombard, MD 60/73/7106, 2:69 AM Certified in  Neurology, Neurophysiology and Neuroimaging  [facility] [address], [place], [place] [address] [phone]

## 2017-05-10 DIAGNOSIS — M25561 Pain in right knee: Secondary | ICD-10-CM | POA: Diagnosis not present

## 2017-05-10 DIAGNOSIS — S46011D Strain of muscle(s) and tendon(s) of the rotator cuff of right shoulder, subsequent encounter: Secondary | ICD-10-CM | POA: Diagnosis not present

## 2017-05-11 HISTORY — PX: BACK SURGERY: SHX140

## 2017-05-17 DIAGNOSIS — M6281 Muscle weakness (generalized): Secondary | ICD-10-CM | POA: Diagnosis not present

## 2017-05-17 DIAGNOSIS — S83401D Sprain of unspecified collateral ligament of right knee, subsequent encounter: Secondary | ICD-10-CM | POA: Diagnosis not present

## 2017-05-17 DIAGNOSIS — M25561 Pain in right knee: Secondary | ICD-10-CM | POA: Diagnosis not present

## 2017-05-17 DIAGNOSIS — Z9181 History of falling: Secondary | ICD-10-CM | POA: Diagnosis not present

## 2017-05-20 DIAGNOSIS — S83401D Sprain of unspecified collateral ligament of right knee, subsequent encounter: Secondary | ICD-10-CM | POA: Diagnosis not present

## 2017-05-20 DIAGNOSIS — M6281 Muscle weakness (generalized): Secondary | ICD-10-CM | POA: Diagnosis not present

## 2017-05-20 DIAGNOSIS — M25561 Pain in right knee: Secondary | ICD-10-CM | POA: Diagnosis not present

## 2017-05-20 DIAGNOSIS — Z9181 History of falling: Secondary | ICD-10-CM | POA: Diagnosis not present

## 2017-05-25 DIAGNOSIS — M25561 Pain in right knee: Secondary | ICD-10-CM | POA: Diagnosis not present

## 2017-05-25 DIAGNOSIS — Z9181 History of falling: Secondary | ICD-10-CM | POA: Diagnosis not present

## 2017-05-25 DIAGNOSIS — S83401D Sprain of unspecified collateral ligament of right knee, subsequent encounter: Secondary | ICD-10-CM | POA: Diagnosis not present

## 2017-05-25 DIAGNOSIS — M6281 Muscle weakness (generalized): Secondary | ICD-10-CM | POA: Diagnosis not present

## 2017-06-01 DIAGNOSIS — Z9181 History of falling: Secondary | ICD-10-CM | POA: Diagnosis not present

## 2017-06-01 DIAGNOSIS — M6281 Muscle weakness (generalized): Secondary | ICD-10-CM | POA: Diagnosis not present

## 2017-06-01 DIAGNOSIS — S83401D Sprain of unspecified collateral ligament of right knee, subsequent encounter: Secondary | ICD-10-CM | POA: Diagnosis not present

## 2017-06-01 DIAGNOSIS — M25561 Pain in right knee: Secondary | ICD-10-CM | POA: Diagnosis not present

## 2017-06-04 DIAGNOSIS — M25561 Pain in right knee: Secondary | ICD-10-CM | POA: Diagnosis not present

## 2017-06-04 DIAGNOSIS — M6281 Muscle weakness (generalized): Secondary | ICD-10-CM | POA: Diagnosis not present

## 2017-06-04 DIAGNOSIS — Z9181 History of falling: Secondary | ICD-10-CM | POA: Diagnosis not present

## 2017-06-04 DIAGNOSIS — S83401D Sprain of unspecified collateral ligament of right knee, subsequent encounter: Secondary | ICD-10-CM | POA: Diagnosis not present

## 2017-06-08 DIAGNOSIS — M25561 Pain in right knee: Secondary | ICD-10-CM | POA: Diagnosis not present

## 2017-06-08 DIAGNOSIS — M6281 Muscle weakness (generalized): Secondary | ICD-10-CM | POA: Diagnosis not present

## 2017-06-08 DIAGNOSIS — Z9181 History of falling: Secondary | ICD-10-CM | POA: Diagnosis not present

## 2017-06-08 DIAGNOSIS — S83401D Sprain of unspecified collateral ligament of right knee, subsequent encounter: Secondary | ICD-10-CM | POA: Diagnosis not present

## 2017-06-10 DIAGNOSIS — M6281 Muscle weakness (generalized): Secondary | ICD-10-CM | POA: Diagnosis not present

## 2017-06-10 DIAGNOSIS — S83401D Sprain of unspecified collateral ligament of right knee, subsequent encounter: Secondary | ICD-10-CM | POA: Diagnosis not present

## 2017-06-10 DIAGNOSIS — M25561 Pain in right knee: Secondary | ICD-10-CM | POA: Diagnosis not present

## 2017-06-10 DIAGNOSIS — Z9181 History of falling: Secondary | ICD-10-CM | POA: Diagnosis not present

## 2017-06-15 DIAGNOSIS — M25561 Pain in right knee: Secondary | ICD-10-CM | POA: Diagnosis not present

## 2017-06-15 DIAGNOSIS — M6281 Muscle weakness (generalized): Secondary | ICD-10-CM | POA: Diagnosis not present

## 2017-06-15 DIAGNOSIS — S83401D Sprain of unspecified collateral ligament of right knee, subsequent encounter: Secondary | ICD-10-CM | POA: Diagnosis not present

## 2017-06-15 DIAGNOSIS — Z9181 History of falling: Secondary | ICD-10-CM | POA: Diagnosis not present

## 2017-06-20 DIAGNOSIS — J029 Acute pharyngitis, unspecified: Secondary | ICD-10-CM | POA: Diagnosis not present

## 2017-06-20 DIAGNOSIS — M791 Myalgia, unspecified site: Secondary | ICD-10-CM | POA: Diagnosis not present

## 2017-06-20 DIAGNOSIS — Z20828 Contact with and (suspected) exposure to other viral communicable diseases: Secondary | ICD-10-CM | POA: Diagnosis not present

## 2017-06-29 DIAGNOSIS — M25561 Pain in right knee: Secondary | ICD-10-CM | POA: Diagnosis not present

## 2017-07-01 DIAGNOSIS — E782 Mixed hyperlipidemia: Secondary | ICD-10-CM | POA: Diagnosis not present

## 2017-07-01 DIAGNOSIS — E039 Hypothyroidism, unspecified: Secondary | ICD-10-CM | POA: Diagnosis not present

## 2017-07-01 DIAGNOSIS — Z0000 Encounter for general adult medical examination without abnormal findings: Secondary | ICD-10-CM | POA: Diagnosis not present

## 2017-07-01 DIAGNOSIS — J4520 Mild intermittent asthma, uncomplicated: Secondary | ICD-10-CM | POA: Diagnosis not present

## 2017-07-01 DIAGNOSIS — I10 Essential (primary) hypertension: Secondary | ICD-10-CM | POA: Diagnosis not present

## 2017-07-08 ENCOUNTER — Other Ambulatory Visit: Payer: Self-pay | Admitting: Urology

## 2017-07-08 DIAGNOSIS — J4520 Mild intermittent asthma, uncomplicated: Secondary | ICD-10-CM | POA: Diagnosis not present

## 2017-07-08 DIAGNOSIS — M1000 Idiopathic gout, unspecified site: Secondary | ICD-10-CM | POA: Diagnosis not present

## 2017-07-08 DIAGNOSIS — E559 Vitamin D deficiency, unspecified: Secondary | ICD-10-CM | POA: Diagnosis not present

## 2017-07-08 DIAGNOSIS — D3001 Benign neoplasm of right kidney: Secondary | ICD-10-CM

## 2017-07-08 DIAGNOSIS — I10 Essential (primary) hypertension: Secondary | ICD-10-CM | POA: Diagnosis not present

## 2017-07-08 DIAGNOSIS — M5136 Other intervertebral disc degeneration, lumbar region: Secondary | ICD-10-CM | POA: Diagnosis not present

## 2017-07-08 DIAGNOSIS — G250 Essential tremor: Secondary | ICD-10-CM | POA: Diagnosis not present

## 2017-07-08 DIAGNOSIS — E039 Hypothyroidism, unspecified: Secondary | ICD-10-CM | POA: Diagnosis not present

## 2017-07-08 DIAGNOSIS — E782 Mixed hyperlipidemia: Secondary | ICD-10-CM | POA: Diagnosis not present

## 2017-07-22 ENCOUNTER — Ambulatory Visit (HOSPITAL_COMMUNITY)
Admission: RE | Admit: 2017-07-22 | Discharge: 2017-07-22 | Disposition: A | Discharge status: Home / Self Care | Payer: Medicare Other | Source: Ambulatory Visit | Attending: Urology | Admitting: Urology

## 2017-07-22 DIAGNOSIS — D3001 Benign neoplasm of right kidney: Secondary | ICD-10-CM

## 2017-07-22 DIAGNOSIS — C649 Malignant neoplasm of unspecified kidney, except renal pelvis: Secondary | ICD-10-CM | POA: Diagnosis not present

## 2017-07-22 LAB — POCT I-STAT CREATININE: Creatinine, Ser: 0.7 mg/dL (ref 0.44–1.00)

## 2017-07-22 MED ORDER — GADOBENATE DIMEGLUMINE 529 MG/ML IV SOLN
15.0000 mL | Freq: Once | INTRAVENOUS | Status: AC | PRN
  Administered 2017-07-22: 13 mL via INTRAVENOUS

## 2017-08-02 DIAGNOSIS — D4101 Neoplasm of uncertain behavior of right kidney: Secondary | ICD-10-CM | POA: Diagnosis not present

## 2017-08-17 DIAGNOSIS — H2513 Age-related nuclear cataract, bilateral: Secondary | ICD-10-CM | POA: Diagnosis not present

## 2017-09-09 DIAGNOSIS — Z6826 Body mass index (BMI) 26.0-26.9, adult: Secondary | ICD-10-CM | POA: Diagnosis not present

## 2017-09-09 DIAGNOSIS — M48062 Spinal stenosis, lumbar region with neurogenic claudication: Secondary | ICD-10-CM | POA: Diagnosis not present

## 2017-09-13 DIAGNOSIS — M48062 Spinal stenosis, lumbar region with neurogenic claudication: Secondary | ICD-10-CM | POA: Diagnosis not present

## 2017-09-16 DIAGNOSIS — M48062 Spinal stenosis, lumbar region with neurogenic claudication: Secondary | ICD-10-CM | POA: Diagnosis not present

## 2017-09-16 DIAGNOSIS — M4807 Spinal stenosis, lumbosacral region: Secondary | ICD-10-CM | POA: Diagnosis not present

## 2017-09-21 DIAGNOSIS — Z6826 Body mass index (BMI) 26.0-26.9, adult: Secondary | ICD-10-CM | POA: Diagnosis not present

## 2017-09-21 DIAGNOSIS — I10 Essential (primary) hypertension: Secondary | ICD-10-CM | POA: Diagnosis not present

## 2017-09-21 DIAGNOSIS — M48062 Spinal stenosis, lumbar region with neurogenic claudication: Secondary | ICD-10-CM | POA: Diagnosis not present

## 2017-09-22 ENCOUNTER — Other Ambulatory Visit: Payer: Self-pay | Admitting: Neurological Surgery

## 2017-09-24 DIAGNOSIS — L814 Other melanin hyperpigmentation: Secondary | ICD-10-CM | POA: Diagnosis not present

## 2017-09-24 DIAGNOSIS — D1801 Hemangioma of skin and subcutaneous tissue: Secondary | ICD-10-CM | POA: Diagnosis not present

## 2017-09-24 DIAGNOSIS — L918 Other hypertrophic disorders of the skin: Secondary | ICD-10-CM | POA: Diagnosis not present

## 2017-09-24 DIAGNOSIS — D22111 Melanocytic nevi of right upper eyelid, including canthus: Secondary | ICD-10-CM | POA: Diagnosis not present

## 2017-09-24 DIAGNOSIS — L821 Other seborrheic keratosis: Secondary | ICD-10-CM | POA: Diagnosis not present

## 2017-09-24 DIAGNOSIS — Z85828 Personal history of other malignant neoplasm of skin: Secondary | ICD-10-CM | POA: Diagnosis not present

## 2017-09-24 DIAGNOSIS — L820 Inflamed seborrheic keratosis: Secondary | ICD-10-CM | POA: Diagnosis not present

## 2017-09-24 DIAGNOSIS — L570 Actinic keratosis: Secondary | ICD-10-CM | POA: Diagnosis not present

## 2017-10-14 ENCOUNTER — Other Ambulatory Visit: Payer: Self-pay | Admitting: Diagnostic Neuroimaging

## 2017-12-15 DIAGNOSIS — Z01818 Encounter for other preprocedural examination: Secondary | ICD-10-CM | POA: Diagnosis not present

## 2017-12-15 DIAGNOSIS — M5136 Other intervertebral disc degeneration, lumbar region: Secondary | ICD-10-CM | POA: Diagnosis not present

## 2017-12-15 DIAGNOSIS — I10 Essential (primary) hypertension: Secondary | ICD-10-CM | POA: Diagnosis not present

## 2017-12-15 DIAGNOSIS — E039 Hypothyroidism, unspecified: Secondary | ICD-10-CM | POA: Diagnosis not present

## 2017-12-15 DIAGNOSIS — J4520 Mild intermittent asthma, uncomplicated: Secondary | ICD-10-CM | POA: Diagnosis not present

## 2017-12-15 DIAGNOSIS — E782 Mixed hyperlipidemia: Secondary | ICD-10-CM | POA: Diagnosis not present

## 2017-12-16 NOTE — Pre-Procedure Instructions (Addendum)
[place]  12/16/2017      [place], [place] [address] [place] [place] [address] Phone: [phone] Fax: [phone]    Your procedure is scheduled on Monday, December 27, 2017.  Report to [facility] Admitting at 5:30 A.M.  Call this number if you have problems the morning of surgery:  [phone]   Remember:  Do not eat or drink after midnight.      Take these medicines the morning of surgery with A SIP OF WATER:   Carbidopa-levodopa, propanolol, Synthroid, venlafaxine XR. You may take acetaminophen and your                  albuterol inhaler if needed.    7 days prior to surgery STOP taking any Aspirin, Aleve, Naproxen, Ibuprofen, Motrin, Advil, Goody's,                  BC's, all herbal medications, fish oil, supplements and all vitamins.     Do not wear jewelry, make-up or nail polish.  Do not wear lotions, powders, or perfumes, or deodorant.  Do not shave 48 hours prior to surgery.  Men may shave face and neck.  Do not bring valuables to the hospital.  [facility] is not responsible for any belongings or valuables.   Contacts, dentures or bridgework may not be worn into surgery.  Leave your suitcase in the car.  After surgery it may be brought to your room.  For patients admitted to the hospital, discharge time will be determined by your treatment team.  Patients discharged the day of surgery will not be allowed to drive home.   [facility]- Preparing For Surgery Before surgery, you can play an important role. Because skin is not sterile, your skin needs to be as free of germs as possible. You can reduce the number of germs on your skin by washing with CHG (chlorahexidine gluconate) Soap before surgery. CHG is an antiseptic cleaner which kills germs and bonds with the skin to continue killing germs even after washing. Please do not use if you have an allergy to CHG or antibacterial soaps. If  your skin becomes reddened/irritated stop using the CHG.  Do not shave (including legs and underarms) for at least 48 hours prior to first CHG shower. It is OK to shave your face. Please follow these instructions carefully. 1. Shower the NIGHT BEFORE SURGERY and the MORNING OF SURGERY with CHG.  2. If you chose to wash your hair, wash your hair first as usual with your normal shampoo.  3. After you shampoo, rinse your hair and body thoroughly to remove the shampoo.  4. Use CHG as you would any other liquid soap. You can apply CHG directly to the skin and wash gently with a scrungie or a clean washcloth.  5. Apply the CHG Soap to your body ONLY FROM THE NECK DOWN. Do not use on open wounds or open sores. Avoid contact with your eyes, ears, mouth and genitals (private parts). Wash genitals (private parts) with your normal soap.  6. Wash thoroughly, paying special attention to the area where your surgery will be performed.  7. Thoroughly rinse your body with warm water from the neck down.  8. DO NOT shower/wash with your normal soap after using and rinsing off the CHG Soap.  9. Pat yourself dry with a CLEAN TOWEL.  10. Wear CLEAN PAJAMAS  11. Place CLEAN SHEETS on  your bed the night of your first shower and DO NOT SLEEP WITH PETS.  ? ? Day of Surgery: Do not apply any deodorants/lotions. Please wear clean clothes to the hospital/surgery center.

## 2017-12-17 ENCOUNTER — Other Ambulatory Visit: Payer: Self-pay

## 2017-12-17 ENCOUNTER — Encounter (HOSPITAL_COMMUNITY): Payer: Self-pay

## 2017-12-17 ENCOUNTER — Encounter (HOSPITAL_COMMUNITY)
Admission: RE | Admit: 2017-12-17 | Discharge: 2017-12-17 | Disposition: A | Discharge status: Home / Self Care | Payer: Medicare Other | Source: Ambulatory Visit | Attending: Neurological Surgery | Admitting: Neurological Surgery

## 2017-12-17 DIAGNOSIS — Z8673 Personal history of transient ischemic attack (TIA), and cerebral infarction without residual deficits: Secondary | ICD-10-CM | POA: Insufficient documentation

## 2017-12-17 DIAGNOSIS — Z01812 Encounter for preprocedural laboratory examination: Secondary | ICD-10-CM | POA: Insufficient documentation

## 2017-12-17 DIAGNOSIS — I10 Essential (primary) hypertension: Secondary | ICD-10-CM | POA: Diagnosis not present

## 2017-12-17 DIAGNOSIS — G20 Parkinson's disease: Secondary | ICD-10-CM | POA: Diagnosis not present

## 2017-12-17 DIAGNOSIS — E039 Hypothyroidism, unspecified: Secondary | ICD-10-CM | POA: Insufficient documentation

## 2017-12-17 HISTORY — DX: Myoneural disorder, unspecified: G70.9

## 2017-12-17 LAB — TYPE AND SCREEN
ABO/RH(D): A NEG
Antibody Screen: NEGATIVE

## 2017-12-17 LAB — BASIC METABOLIC PANEL WITH GFR
Anion gap: 9 (ref 5–15)
BUN: 17 mg/dL (ref 8–23)
CO2: 31 mmol/L (ref 22–32)
Calcium: 9.6 mg/dL (ref 8.9–10.3)
Chloride: 105 mmol/L (ref 98–111)
Creatinine, Ser: 0.82 mg/dL (ref 0.44–1.00)
GFR calc Af Amer: >60 mL/min (ref >60)
GFR calc non Af Amer: >60 mL/min (ref >60)
Glucose, Bld: 95 mg/dL (ref 70–99)
Potassium: 3.7 mmol/L (ref 3.5–5.1)
Sodium: 145 mmol/L (ref 135–145)

## 2017-12-17 LAB — CBC
HCT: 40.5 % (ref 36.0–46.0)
Hemoglobin: 12.5 g/dL (ref 12.0–15.0)
MCH: 29.5 pg (ref 26.0–34.0)
MCHC: 30.9 g/dL (ref 30.0–36.0)
MCV: 95.5 fL (ref 78.0–100.0)
PLATELETS: 278 10*3/uL (ref 150–400)
RBC: 4.24 MIL/uL (ref 3.87–5.11)
RDW: 13.5 % (ref 11.5–15.5)
WBC: 7.6 10*3/uL (ref 4.0–10.5)

## 2017-12-17 LAB — SURGICAL PCR SCREEN
MRSA, PCR: NEGATIVE
STAPHYLOCOCCUS AUREUS: NEGATIVE

## 2017-12-17 NOTE — Progress Notes (Signed)
REQUESTED EKG FROM [facility].

## 2017-12-20 NOTE — Progress Notes (Addendum)
Anesthesia Chart Review:  Case:  [id number] Date/Time:  12/27/17 0715   Procedure:  PLIF - L1-L2 (N/A Back)   Anesthesia type:  General   Pre-op diagnosis:  Stenosis   Location:  [facility] OR ROOM 20 / [facility]   Surgeon:  Kristeen Miss, MD      DISCUSSION: Patient is a 72 year old female scheduled for the above procedure.  History includes never smoker, postoperative N/V, TIA ~ '11, HTN, hypothyroidism, tremor/Parkinsonism (unspecified), asthma.   She has medical clearance from Dr. Ashby Dawes. If no acute changes then I anticipate that she can proceed as planned.    VS: BP 132/68   Pulse 70   Temp 36.8 C   Resp 20   Ht 5' (1.524 m)   Wt 63.2 kg   SpO2 95%   BMI 27.21 kg/m   PROVIDERS: Merrilee Seashore, MD is PCP with [facility]). She was seen on 12/15/17 with for presurgical clearance. He cleared her for lumbar fusion.  - Andrey Spearman, MD is neurologist. Last visit 04/05/17.    LABS: Labs reviewed: Acceptable for surgery. (all labs ordered are listed, but only abnormal results are displayed)  Labs Reviewed  SURGICAL PCR SCREEN  CBC  BASIC METABOLIC PANEL  TYPE AND SCREEN    IMAGES: CTA head/neck 10/13/16: IMPRESSION: 1. Negative head and neck CTA aside from arterial tortuosity. Minimal intra- and extracranial atherosclerosis with no stenosis identified. The right ICA is mildly non-dominant due to dominance of the left ACA A1 segment, and there is chronic severe tortuosity of the cervical right ICA. 2.  Normal for age non contrast CT appearance of the brain. 3. Widespread chronic cervical spine fusion from the C3 to the C7 level. Sequelae of C7-T1 ACDF but pseudarthrosis at that level. 4. Chronic right maxillary sinusitis.  EKG: 12/15/17 ([facility]): SR with frequent PACs.   CV: N/A   Past Medical History:  Diagnosis Date  . Arthritis   . Asthma    hx  . Hypertension   . Hypothyroidism   . Neuromuscular disorder ([facility])    tremors in hands   . Pneumonia    hx  . PONV (postoperative nausea and vomiting)    has scop patch to apply  10/13/12  . Stroke [facility]) 2011   tia's  . Thyroid disease   . TIA (transient ischemic attack)     Past Surgical History:  Procedure Laterality Date  . ANTERIOR CERVICAL DECOMP/DISCECTOMY FUSION N/A 10/14/2012   Procedure: ANTERIOR CERVICAL DECOMPRESSION/DISCECTOMY FUSION 1 LEVEL/HARDWARE REMOVAL;  Surgeon: Kristeen Miss, MD;  Location: [facility];  Service: Neurosurgery;  Laterality: N/A;  C7-T1 Anterior cervical decompression/diskectomy/fusion/removal of old synthes plate  . BACK SURGERY  01  . DILATION AND CURETTAGE OF UTERUS    . HYSTEROSCOPY  2004, 2007   endometrial polyp  . KNEE SURGERY Right    72 yrs old  . NASAL SINUS SURGERY Right 08/2016   polyp removed  . NECK SURGERY  07,03   cerv,Fusion  . ROTATOR CUFF REPAIR Right 2015  . TONSILLECTOMY      MEDICATIONS: . acetaminophen (TYLENOL) 500 MG tablet  . albuterol (PROVENTIL HFA;VENTOLIN HFA) 108 (90 BASE) MCG/ACT inhaler  . carbidopa-levodopa (SINEMET IR) 25-100 MG tablet  . Cholecalciferol (VITAMIN D3) 5000 units CAPS  . Coenzyme Q10 (COQ10) 200 MG CAPS  . losartan (COZAAR) 100 MG tablet  . pravastatin (PRAVACHOL) 20 MG tablet  . propranolol ER (INDERAL LA) 80 MG 24 hr capsule  . SYNTHROID 88 MCG  tablet  . venlafaxine XR (EFFEXOR-XR) 37.5 MG 24 hr capsule   No current facility-administered medications for this encounter.     [facility]/Anesthesiology Phone [phone] 12/21/2017 10:51 AM

## 2017-12-31 ENCOUNTER — Encounter (HOSPITAL_COMMUNITY): Payer: Self-pay | Admitting: Emergency Medicine

## 2017-12-31 ENCOUNTER — Other Ambulatory Visit: Payer: Self-pay

## 2017-12-31 ENCOUNTER — Emergency Department (HOSPITAL_COMMUNITY)
Admission: EM | Admit: 2017-12-31 | Discharge: 2017-12-31 | Disposition: A | Discharge status: Home / Self Care | Payer: Medicare Other | Attending: Emergency Medicine | Admitting: Emergency Medicine

## 2017-12-31 ENCOUNTER — Emergency Department (HOSPITAL_COMMUNITY): Payer: Medicare Other

## 2017-12-31 DIAGNOSIS — G20 Parkinson's disease: Secondary | ICD-10-CM | POA: Insufficient documentation

## 2017-12-31 DIAGNOSIS — M546 Pain in thoracic spine: Secondary | ICD-10-CM

## 2017-12-31 DIAGNOSIS — I10 Essential (primary) hypertension: Secondary | ICD-10-CM | POA: Insufficient documentation

## 2017-12-31 DIAGNOSIS — N390 Urinary tract infection, site not specified: Secondary | ICD-10-CM

## 2017-12-31 DIAGNOSIS — R509 Fever, unspecified: Secondary | ICD-10-CM | POA: Diagnosis not present

## 2017-12-31 DIAGNOSIS — E039 Hypothyroidism, unspecified: Secondary | ICD-10-CM | POA: Diagnosis not present

## 2017-12-31 DIAGNOSIS — Z79899 Other long term (current) drug therapy: Secondary | ICD-10-CM | POA: Insufficient documentation

## 2017-12-31 DIAGNOSIS — M5489 Other dorsalgia: Secondary | ICD-10-CM | POA: Insufficient documentation

## 2017-12-31 LAB — BASIC METABOLIC PANEL
Anion gap: 8 (ref 5–15)
BUN: 13 mg/dL (ref 8–23)
CO2: 25 mmol/L (ref 22–32)
CREATININE: 0.73 mg/dL (ref 0.44–1.00)
Calcium: 9.4 mg/dL (ref 8.9–10.3)
Chloride: 104 mmol/L (ref 98–111)
Glucose, Bld: 101 mg/dL — ABNORMAL HIGH (ref 70–99)
POTASSIUM: 4.3 mmol/L (ref 3.5–5.1)
SODIUM: 137 mmol/L (ref 135–145)

## 2017-12-31 LAB — CBC WITH DIFFERENTIAL/PLATELET
ABS IMMATURE GRANULOCYTES: 0.1 10*3/uL (ref 0.0–0.1)
Basophils Absolute: 0.1 10*3/uL (ref 0.0–0.1)
Basophils Relative: 1 %
Eosinophils Absolute: 0 10*3/uL (ref 0.0–0.7)
Eosinophils Relative: 0 %
HEMATOCRIT: 39.3 % (ref 36.0–46.0)
HEMOGLOBIN: 12.1 g/dL (ref 12.0–15.0)
Immature Granulocytes: 0 %
LYMPHS PCT: 12 %
Lymphs Abs: 1.7 10*3/uL (ref 0.7–4.0)
MCH: 29.6 pg (ref 26.0–34.0)
MCHC: 30.8 g/dL (ref 30.0–36.0)
MCV: 96.1 fL (ref 78.0–100.0)
MONO ABS: 1.6 10*3/uL — AB (ref 0.1–1.0)
MONOS PCT: 12 %
NEUTROS ABS: 10.3 10*3/uL — AB (ref 1.7–7.7)
Neutrophils Relative %: 75 %
Platelets: 280 10*3/uL (ref 150–400)
RBC: 4.09 MIL/uL (ref 3.87–5.11)
RDW: 13.1 % (ref 11.5–15.5)
WBC: 13.8 10*3/uL — ABNORMAL HIGH (ref 4.0–10.5)

## 2017-12-31 LAB — URINALYSIS, ROUTINE W REFLEX MICROSCOPIC
GLUCOSE, UA: NEGATIVE mg/dL
Ketones, ur: 5 mg/dL — AB
NITRITE: NEGATIVE
PH: 5 (ref 5.0–8.0)
Protein, ur: NEGATIVE mg/dL
Specific Gravity, Urine: 1.024 (ref 1.005–1.030)

## 2017-12-31 MED ORDER — CEPHALEXIN 500 MG PO CAPS: 500.0000 mg | ORAL_CAPSULE | Freq: Two times a day (BID) | ORAL | 0 refills | Status: DC

## 2017-12-31 MED ORDER — CEPHALEXIN 250 MG PO CAPS
500.0000 mg | ORAL_CAPSULE | Freq: Once | ORAL | Status: AC
  Administered 2017-12-31: 500 mg via ORAL
  Filled 2017-12-31: qty 2

## 2017-12-31 MED ORDER — CEPHALEXIN 500 MG PO CAPS: 500.0000 mg | ORAL_CAPSULE | Freq: Two times a day (BID) | ORAL | 0 refills | Status: AC

## 2017-12-31 NOTE — ED Triage Notes (Addendum)
Pt reports having upper back pain on Tuesday which got progressively worse. Pt was prescribed muscle relaxers by doctor without relief. Pt scheduled to have an fusion this upcoming Tuesday. Pt then reported she started having a fever and increased pain and the nurse told pt to come here. Pt reports pain is now more in her left flank and chillsflank . Pt denies urinary and neuro sx. Pt has not taken any pain/fever medications d/t upcoming surgery.

## 2017-12-31 NOTE — ED Provider Notes (Signed)
[place] EMERGENCY DEPARTMENT Provider Note   CSN: [id number] Arrival date & time: 12/31/17  1456     History   Chief Complaint Chief Complaint  Patient presents with  . Fever  . Back Pain    HPI PERLA Jeanette [name] is a 72 y.o. female.  HPI 72 year old female with history of chronic back pain, prior TIAs, hypothyroidism, & parkinsonism presents to the emergency department today for evaluation of upper back pain as well as "low-grade fevers".  States that symptoms started on Tuesday and have been persistent.  Also with history of cervical and prior lumbar fusions.  Denies any pain at rest however does have pain in her upper back between scapula that is worse with movement.  Denies any chest pain or shortness of breath.  No vomiting.  No abdominal pain.  Denies any dysuria or hematuria.  No numbness or weakness.  Has upcoming lumbar spine fusion scheduled for Tuesday.  States that she spoke with the surgery clinic today and due to her low-grade fevers they advised that she come to the emergency department for evaluation.  Has tried muscle relaxer prescribed to her by her spine surgeon with no relief.  No difficulty urinating.  Past Medical History:  Diagnosis Date  . Arthritis   . Asthma    hx  . Hypertension   . Hypothyroidism   . Neuromuscular disorder (HCC)    tremors in hands  . Pneumonia    hx  . PONV (postoperative nausea and vomiting)    has scop patch to apply  10/13/12  . Stroke [facility]) 2011   tia's  . Thyroid disease   . TIA (transient ischemic attack)     Patient Active Problem List   Diagnosis Date Noted  . Carotid stenosis, right 05/28/2013  . Neck pain on right side 05/28/2013  . Cervical spondylosis without myelopathy 10/14/2012  . Hypertension   . Thyroid disease   . Endometrial polyp   . TIA (transient ischemic attack)     Past Surgical History:  Procedure Laterality Date  . ANTERIOR CERVICAL DECOMP/DISCECTOMY FUSION N/A 10/14/2012   Procedure: ANTERIOR CERVICAL DECOMPRESSION/DISCECTOMY FUSION 1 LEVEL/HARDWARE REMOVAL;  Surgeon: Kristeen Miss, MD;  Location: [place] [facility]UR[facility] ORS;  Service: Neurosurgery;  Laterality: N/A;  C7-T1 Anterior cervical decompression/diskectomy/fusion/removal of old synthes plate  . BACK SURGERY  01  . DILATION AND CURETTAGE OF UTERUS    . HYSTEROSCOPY  2004, 2007   endometrial polyp  . KNEE SURGERY Right    72 yrs old  . NASAL SINUS SURGERY Right 08/2016   polyp removed  . NECK SURGERY  07,03   cerv,Fusion  . ROTATOR CUFF REPAIR Right 2015  . TONSILLECTOMY       OB History    Gravida  2   Para  2   Term  2   Preterm      AB      Living  2     SAB      TAB      Ectopic      Multiple      Live Births               Home Medications    Prior to Admission medications   Medication Sig Start Date End Date Taking? Authorizing Provider  acetaminophen (TYLENOL) 500 MG tablet Take 1,000 mg by mouth daily as needed for moderate pain or headache.    [provider]  albuterol (PROVENTIL HFA;VENTOLIN HFA) 108 (90  BASE) MCG/ACT inhaler Inhale 2 puffs into the lungs every 6 (six) hours as needed for wheezing.     [provider]  carbidopa-levodopa (SINEMET IR) 25-100 MG tablet Take 1 tablet by mouth 3 (three) times daily.    [provider]  cephALEXin (KEFLEX) 500 MG capsule Take 1 capsule (500 mg total) by mouth 2 (two) times daily for 7 days. 12/31/17 01/07/18  Corrie Dandy, MD  Cholecalciferol (VITAMIN D3) 5000 units CAPS Take 5,000 Units by mouth daily.    [provider]  Coenzyme Q10 (COQ10) 200 MG CAPS Take 200 mg by mouth daily.     [provider]  losartan (COZAAR) 100 MG tablet Take 100 mg by mouth daily. 03/10/16   [provider]  pravastatin (PRAVACHOL) 20 MG tablet Take 20 mg by mouth daily.  02/26/16   [provider]  propranolol ER (INDERAL LA) 80 MG 24 hr capsule TAKE 1 CAPSULE BY MOUTH ONCE DAILY 10/15/17    Penumalli, Earlean Polka, MD  SYNTHROID 88 MCG tablet Take 88 mcg by mouth daily. 03/03/16   [provider]  venlafaxine XR (EFFEXOR-XR) 37.5 MG 24 hr capsule Take 37.5 mg by mouth every evening.  03/29/16   [provider]    Family History Family History  Problem Relation Age of Onset  . Leukemia Mother   . Ovarian cancer Mother   . Tremor Mother   . Polycythemia Mother   . Hypertension Father   . Heart disease Father   . Parkinson's disease Maternal Aunt   . Stroke Maternal Aunt   . Parkinson's disease Maternal Uncle   . Multiple sclerosis Maternal Uncle   . Colon cancer Neg Hx     Social History Social History   Tobacco Use  . Smoking status: Never Smoker  . Smokeless tobacco: Never Used  Substance Use Topics  . Alcohol use: Yes    Alcohol/week: 1.0 standard drinks    Types: 1 Glasses of wine per week    Comment: wine 1-2 week  . Drug use: No     Allergies   Compazine [prochlorperazine edisylate]   Review of Systems Review of Systems  Constitutional: Positive for fever. Negative for chills.  HENT: Negative for congestion and sore throat.   Eyes: Negative for visual disturbance.  Respiratory: Negative for cough and shortness of breath.   Cardiovascular: Negative for chest pain and leg swelling.  Gastrointestinal: Negative for abdominal pain, diarrhea, nausea and vomiting.  Genitourinary: Negative for dysuria and hematuria.  Musculoskeletal: Positive for back pain (chronic LBP unchanged. Upper midline back pain new). Negative for gait problem and neck pain.  Skin: Negative for color change and rash.  Neurological: Negative for weakness and headaches.  All other systems reviewed and are negative.    Physical Exam Updated Vital Signs BP (!) 105/52   Pulse 70   Temp 99.4 F (37.4 C)   Resp 16   Ht 5' (1.524 m)   Wt 65.8 kg   SpO2 97%   BMI 28.32 kg/m   Physical Exam  Constitutional: She appears well-developed and well-nourished. No  distress.  HENT:  Head: Normocephalic and atraumatic.  Eyes: Conjunctivae are normal.  Neck: Neck supple.  Cardiovascular: Regular rhythm and intact distal pulses. Exam reveals no friction rub.  No murmur heard. Pulmonary/Chest: Effort normal and breath sounds normal. No respiratory distress.  Abdominal: Soft. She exhibits no distension and no mass. There is no tenderness. There is no rebound and no guarding.  Musculoskeletal: She exhibits no edema.  No reproducible tenderness to the midline cervical/thoracic/lumbar spines.  No obvious deformity.  No overlying rash.  Neurological: She is alert.  Alert and oriented x3.  Cranial nerves II-XII intact.  No facial asymmetry.  5/5 grip strength bilaterally.  5/5 bicep flexion and tricep extension bilaterally. 5/5 flexion/extension at knee, hip, and ankles. No discoordination of FNF, heel to shin, and ambulates without ataxia or antalgic gait.    Skin: Skin is warm and dry. Capillary refill takes less than 2 seconds.  Psychiatric: She has a normal mood and affect.  Nursing note and vitals reviewed.    ED Treatments / Results  Labs (all labs ordered are listed, but only abnormal results are displayed) Labs Reviewed  BASIC METABOLIC PANEL - Abnormal; Notable for the following components:      Result Value   Glucose, Bld 101 (*)    All other components within normal limits  URINALYSIS, ROUTINE W REFLEX MICROSCOPIC - Abnormal; Notable for the following components:   APPearance CLOUDY (*)    Hgb urine dipstick SMALL (*)    Bilirubin Urine SMALL (*)    Ketones, ur 5 (*)    Leukocytes, UA MODERATE (*)    Bacteria, UA RARE (*)    All other components within normal limits  CBC WITH DIFFERENTIAL/PLATELET - Abnormal; Notable for the following components:   WBC 13.8 (*)    Neutro Abs 10.3 (*)    Monocytes Absolute 1.6 (*)    All other components within normal limits    EKG None  Radiology Dg Chest 2 View  Result Date:  12/31/2017 CLINICAL DATA:  Fever EXAM: CHEST - 2 VIEW COMPARISON:  10/12/2012 FINDINGS: Postsurgical changes within the cervicothoracic spine. Metallic opacity in the left breast. Mild bronchitic changes at the bases. No acute consolidation or effusion. Mild cardiomegaly. No pneumothorax. Surgical hardware in the spine. IMPRESSION: No active cardiopulmonary disease.  Mild cardiomegaly. Electronically Signed   By: Donavan Foil M.D.   On: 12/31/2017 20:03    Procedures Procedures (including critical care time)  Medications Ordered in ED Medications  cephALEXin (KEFLEX) capsule 500 mg (500 mg Oral Given 12/31/17 2042)     Initial Impression / Assessment and Plan / ED Course  I have reviewed the triage vital signs and the nursing notes.  Pertinent labs & imaging results that were available during my care of the patient were reviewed by me and considered in my medical decision making (see chart for details).    72 year old female with history of chronic back pain, prior TIAs, hypothyroidism, & parkinsonism presents to the emergency department today for evaluation of upper back pain as well as "low-grade fevers".   Patient afebrile and hemodynamically stable here in the emergency department.  No distress.  Resting comfortably.  Exam as detailed above.  No focal neurological deficits.  No overlying skin changes on the cervical/thoracic/lumbar spine.  No midline spine pain.  No findings at this time to suggest spinal epidural abscess or epidural hematoma.  No tenderness over the areas of prior spinal hardware.  Doubt discitis or infected hardware.  Chest x-ray obtained that shows no acute cardiac or pulmonary abnormality.  No evidence of pneumonia.  Urinalysis does have moderate leukocytes which could be contributing to patient's low-grade fevers that are reported at home.  She is afebrile here and with no recent antipyretics.  Her labs are remarkable for leukocytosis of 13.8 with no other acute  findings on BMP.  She is well-appearing  in the emergency department with reassuring exam.  Will discharge on course of Keflex for urinary tract infection and advise close follow-up with her PCP and spine surgeon.  Strict return precautions provided.  Stable at discharge.  Case and plan of care discussed with Dr. Wilson Singer.  Final Clinical Impressions(s) / ED Diagnoses   Final diagnoses:  Lower urinary tract infectious disease  Acute midline thoracic back pain    ED Discharge Orders         Ordered    cephALEXin (KEFLEX) 500 MG capsule  2 times daily,   Status:  Discontinued     12/31/17 2034    cephALEXin (KEFLEX) 500 MG capsule  2 times daily     12/31/17 2040           Corrie Dandy, MD 01/01/18 Albina Billet    Virgel Manifold, MD 01/06/18 1425

## 2017-12-31 NOTE — Discharge Instructions (Signed)
Follow-up with your primary care doctor in 3-5 days for reevaluation.  Discuss starting antibiotics with your surgeon in regards to upcoming surgery next week.  May continue home muscle relaxer, Tylenol, ibuprofen as needed for pain following dosing instructions on packaging.  Return to the emergency department if symptoms worsen, numbness, weakness, worsening fevers or other concerning symptoms.  Take all antibiotics as prescribed.

## 2018-01-04 ENCOUNTER — Inpatient Hospital Stay (HOSPITAL_COMMUNITY): Payer: Medicare Other | Admitting: Certified Registered Nurse Anesthetist

## 2018-01-04 ENCOUNTER — Inpatient Hospital Stay (HOSPITAL_COMMUNITY): Payer: Medicare Other | Admitting: Vascular Surgery

## 2018-01-04 ENCOUNTER — Encounter (HOSPITAL_COMMUNITY): Payer: Self-pay | Admitting: Orthopedic Surgery

## 2018-01-04 ENCOUNTER — Ambulatory Visit: Payer: Medicare Other | Admitting: Diagnostic Neuroimaging

## 2018-01-04 ENCOUNTER — Other Ambulatory Visit: Payer: Self-pay

## 2018-01-04 ENCOUNTER — Inpatient Hospital Stay (HOSPITAL_COMMUNITY)
Admission: RE | Disposition: A | Discharge status: Home / Self Care | Payer: Self-pay | Source: Ambulatory Visit | Attending: Neurological Surgery

## 2018-01-04 ENCOUNTER — Inpatient Hospital Stay (HOSPITAL_COMMUNITY): Payer: Medicare Other

## 2018-01-04 ENCOUNTER — Inpatient Hospital Stay (HOSPITAL_COMMUNITY)
Admission: RE | Admit: 2018-01-04 | Discharge: 2018-01-07 | DRG: 454 | Disposition: A | Discharge status: Home / Self Care | Payer: Medicare Other | Source: Ambulatory Visit | Attending: Neurological Surgery | Admitting: Neurological Surgery

## 2018-01-04 DIAGNOSIS — Z8249 Family history of ischemic heart disease and other diseases of the circulatory system: Secondary | ICD-10-CM | POA: Diagnosis not present

## 2018-01-04 DIAGNOSIS — M4316 Spondylolisthesis, lumbar region: Secondary | ICD-10-CM | POA: Diagnosis not present

## 2018-01-04 DIAGNOSIS — F418 Other specified anxiety disorders: Secondary | ICD-10-CM | POA: Diagnosis present

## 2018-01-04 DIAGNOSIS — Z981 Arthrodesis status: Secondary | ICD-10-CM

## 2018-01-04 DIAGNOSIS — G20 Parkinson's disease: Secondary | ICD-10-CM | POA: Diagnosis present

## 2018-01-04 DIAGNOSIS — E039 Hypothyroidism, unspecified: Secondary | ICD-10-CM | POA: Diagnosis present

## 2018-01-04 DIAGNOSIS — Z823 Family history of stroke: Secondary | ICD-10-CM | POA: Diagnosis not present

## 2018-01-04 DIAGNOSIS — D62 Acute posthemorrhagic anemia: Secondary | ICD-10-CM | POA: Diagnosis not present

## 2018-01-04 DIAGNOSIS — I10 Essential (primary) hypertension: Secondary | ICD-10-CM | POA: Diagnosis present

## 2018-01-04 DIAGNOSIS — M4726 Other spondylosis with radiculopathy, lumbar region: Secondary | ICD-10-CM | POA: Diagnosis not present

## 2018-01-04 DIAGNOSIS — Z8673 Personal history of transient ischemic attack (TIA), and cerebral infarction without residual deficits: Secondary | ICD-10-CM

## 2018-01-04 DIAGNOSIS — Z888 Allergy status to other drugs, medicaments and biological substances status: Secondary | ICD-10-CM

## 2018-01-04 DIAGNOSIS — M48062 Spinal stenosis, lumbar region with neurogenic claudication: Principal | ICD-10-CM | POA: Diagnosis present

## 2018-01-04 DIAGNOSIS — M5417 Radiculopathy, lumbosacral region: Secondary | ICD-10-CM | POA: Diagnosis not present

## 2018-01-04 DIAGNOSIS — Z419 Encounter for procedure for purposes other than remedying health state, unspecified: Secondary | ICD-10-CM

## 2018-01-04 LAB — TYPE AND SCREEN
ABO/RH(D): A NEG
Antibody Screen: NEGATIVE

## 2018-01-04 SURGERY — POSTERIOR LUMBAR FUSION 1 LEVEL
Anesthesia: General | Site: Back

## 2018-01-04 MED ORDER — PROPRANOLOL HCL ER 80 MG PO CP24
80.0000 mg | ORAL_CAPSULE | Freq: Every day | ORAL | Status: DC
  Administered 2018-01-05 – 2018-01-07 (×3): 80 mg via ORAL
  Filled 2018-01-04 (×3): qty 1

## 2018-01-04 MED ORDER — HEMOSTATIC AGENTS (NO CHARGE) OPTIME
TOPICAL | Status: DC | PRN
  Administered 2018-01-04: 1 via TOPICAL

## 2018-01-04 MED ORDER — BUPIVACAINE HCL (PF) 0.5 % IJ SOLN
INTRAMUSCULAR | Status: AC
  Filled 2018-01-04: qty 30

## 2018-01-04 MED ORDER — CHLORHEXIDINE GLUCONATE CLOTH 2 % EX PADS: 6.0000 | MEDICATED_PAD | Freq: Once | CUTANEOUS | Status: DC

## 2018-01-04 MED ORDER — SODIUM CHLORIDE 0.9 % IV SOLN
INTRAVENOUS | Status: DC | PRN
  Administered 2018-01-04: 14:00:00

## 2018-01-04 MED ORDER — LIDOCAINE-EPINEPHRINE 1 %-1:100000 IJ SOLN
INTRAMUSCULAR | Status: AC
  Filled 2018-01-04: qty 1

## 2018-01-04 MED ORDER — PHENOL 1.4 % MT LIQD: 1.0000 | OROMUCOSAL | Status: DC | PRN

## 2018-01-04 MED ORDER — DEXAMETHASONE SODIUM PHOSPHATE 10 MG/ML IJ SOLN
INTRAMUSCULAR | Status: DC | PRN
  Administered 2018-01-04: 10 mg via INTRAVENOUS

## 2018-01-04 MED ORDER — MIDAZOLAM HCL 2 MG/2ML IJ SOLN
INTRAMUSCULAR | Status: AC
  Filled 2018-01-04: qty 2

## 2018-01-04 MED ORDER — ROCURONIUM BROMIDE 50 MG/5ML IV SOSY
PREFILLED_SYRINGE | INTRAVENOUS | Status: AC
  Filled 2018-01-04: qty 10

## 2018-01-04 MED ORDER — LACTATED RINGERS IV SOLN
INTRAVENOUS | Status: DC
  Administered 2018-01-04 – 2018-01-05 (×2): via INTRAVENOUS

## 2018-01-04 MED ORDER — SODIUM CHLORIDE 0.9 % IV SOLN
INTRAVENOUS | Status: DC | PRN
  Administered 2018-01-04: 15 ug/min via INTRAVENOUS

## 2018-01-04 MED ORDER — SODIUM CHLORIDE 0.9 % IV SOLN: 250.0000 mL | INTRAVENOUS | Status: DC

## 2018-01-04 MED ORDER — CEFAZOLIN SODIUM-DEXTROSE 2-4 GM/100ML-% IV SOLN
2.0000 g | INTRAVENOUS | Status: AC
  Administered 2018-01-04: 2 g via INTRAVENOUS
  Filled 2018-01-04: qty 100

## 2018-01-04 MED ORDER — LACTATED RINGERS IV SOLN
INTRAVENOUS | Status: DC
  Administered 2018-01-04 (×2): via INTRAVENOUS

## 2018-01-04 MED ORDER — KETOROLAC TROMETHAMINE 15 MG/ML IJ SOLN
7.5000 mg | Freq: Four times a day (QID) | INTRAMUSCULAR | Status: AC
  Administered 2018-01-04 – 2018-01-05 (×4): 7.5 mg via INTRAVENOUS
  Filled 2018-01-04 (×3): qty 1

## 2018-01-04 MED ORDER — METHOCARBAMOL 1000 MG/10ML IJ SOLN: 500.0000 mg | Freq: Four times a day (QID) | INTRAVENOUS | Status: DC | PRN

## 2018-01-04 MED ORDER — SODIUM CHLORIDE 0.9% FLUSH
3.0000 mL | Freq: Two times a day (BID) | INTRAVENOUS | Status: DC
  Administered 2018-01-05 – 2018-01-06 (×4): 3 mL via INTRAVENOUS

## 2018-01-04 MED ORDER — ALBUTEROL SULFATE (2.5 MG/3ML) 0.083% IN NEBU: 3.0000 mL | INHALATION_SOLUTION | Freq: Four times a day (QID) | RESPIRATORY_TRACT | Status: DC | PRN

## 2018-01-04 MED ORDER — LIDOCAINE-EPINEPHRINE 1 %-1:100000 IJ SOLN
INTRAMUSCULAR | Status: DC | PRN
  Administered 2018-01-04: 3 mL via INTRADERMAL

## 2018-01-04 MED ORDER — ACETAMINOPHEN 650 MG RE SUPP: 650.0000 mg | RECTAL | Status: DC | PRN

## 2018-01-04 MED ORDER — DOCUSATE SODIUM 100 MG PO CAPS
100.0000 mg | ORAL_CAPSULE | Freq: Two times a day (BID) | ORAL | Status: DC
  Administered 2018-01-05 – 2018-01-07 (×4): 100 mg via ORAL
  Filled 2018-01-04 (×5): qty 1

## 2018-01-04 MED ORDER — KETOROLAC TROMETHAMINE 15 MG/ML IJ SOLN
INTRAMUSCULAR | Status: AC
  Filled 2018-01-04: qty 1

## 2018-01-04 MED ORDER — SENNA 8.6 MG PO TABS
1.0000 | ORAL_TABLET | Freq: Two times a day (BID) | ORAL | Status: DC
  Administered 2018-01-05 – 2018-01-07 (×4): 8.6 mg via ORAL
  Filled 2018-01-04 (×5): qty 1

## 2018-01-04 MED ORDER — BUPIVACAINE HCL (PF) 0.5 % IJ SOLN
INTRAMUSCULAR | Status: DC | PRN
  Administered 2018-01-04: 3 mL

## 2018-01-04 MED ORDER — LOSARTAN POTASSIUM 50 MG PO TABS
100.0000 mg | ORAL_TABLET | Freq: Every day | ORAL | Status: DC
  Administered 2018-01-05 – 2018-01-07 (×3): 100 mg via ORAL
  Filled 2018-01-04 (×3): qty 2

## 2018-01-04 MED ORDER — FLEET ENEMA 7-19 GM/118ML RE ENEM: 1.0000 | ENEMA | Freq: Once | RECTAL | Status: DC | PRN

## 2018-01-04 MED ORDER — ONDANSETRON HCL 4 MG/2ML IJ SOLN: 4.0000 mg | Freq: Four times a day (QID) | INTRAMUSCULAR | Status: DC | PRN

## 2018-01-04 MED ORDER — ACETAMINOPHEN 10 MG/ML IV SOLN
INTRAVENOUS | Status: DC | PRN
  Administered 2018-01-04: 1000 mg via INTRAVENOUS

## 2018-01-04 MED ORDER — CARBIDOPA-LEVODOPA 25-100 MG PO TABS
1.0000 | ORAL_TABLET | Freq: Three times a day (TID) | ORAL | Status: DC
  Administered 2018-01-05 – 2018-01-07 (×6): 1 via ORAL
  Filled 2018-01-04 (×10): qty 1

## 2018-01-04 MED ORDER — HYDROMORPHONE HCL 1 MG/ML IJ SOLN
INTRAMUSCULAR | Status: AC
  Filled 2018-01-04: qty 1

## 2018-01-04 MED ORDER — FENTANYL CITRATE (PF) 250 MCG/5ML IJ SOLN
INTRAMUSCULAR | Status: DC | PRN
  Administered 2018-01-04 (×5): 50 ug via INTRAVENOUS

## 2018-01-04 MED ORDER — POLYETHYLENE GLYCOL 3350 17 G PO PACK: 17.0000 g | PACK | Freq: Every day | ORAL | Status: DC | PRN

## 2018-01-04 MED ORDER — MIDAZOLAM HCL 2 MG/2ML IJ SOLN
INTRAMUSCULAR | Status: DC | PRN
  Administered 2018-01-04: 2 mg via INTRAVENOUS

## 2018-01-04 MED ORDER — ONDANSETRON HCL 4 MG/2ML IJ SOLN
INTRAMUSCULAR | Status: DC | PRN
  Administered 2018-01-04: 4 mg via INTRAVENOUS

## 2018-01-04 MED ORDER — ROCURONIUM BROMIDE 10 MG/ML (PF) SYRINGE
PREFILLED_SYRINGE | INTRAVENOUS | Status: DC | PRN
  Administered 2018-01-04: 10 mg via INTRAVENOUS
  Administered 2018-01-04: 20 mg via INTRAVENOUS
  Administered 2018-01-04: 40 mg via INTRAVENOUS

## 2018-01-04 MED ORDER — SODIUM CHLORIDE 0.9% FLUSH: 3.0000 mL | INTRAVENOUS | Status: DC | PRN

## 2018-01-04 MED ORDER — BUPIVACAINE HCL (PF) 0.5 % IJ SOLN
INTRAMUSCULAR | Status: DC | PRN
  Administered 2018-01-04: 27 mL

## 2018-01-04 MED ORDER — METHOCARBAMOL 500 MG PO TABS
500.0000 mg | ORAL_TABLET | Freq: Four times a day (QID) | ORAL | Status: DC | PRN
  Administered 2018-01-04 – 2018-01-06 (×6): 500 mg via ORAL
  Filled 2018-01-04 (×6): qty 1

## 2018-01-04 MED ORDER — LIDOCAINE 2% (20 MG/ML) 5 ML SYRINGE
INTRAMUSCULAR | Status: AC
  Filled 2018-01-04: qty 5

## 2018-01-04 MED ORDER — DEXAMETHASONE SODIUM PHOSPHATE 10 MG/ML IJ SOLN
INTRAMUSCULAR | Status: AC
  Filled 2018-01-04: qty 1

## 2018-01-04 MED ORDER — FENTANYL CITRATE (PF) 250 MCG/5ML IJ SOLN
INTRAMUSCULAR | Status: AC
  Filled 2018-01-04: qty 5

## 2018-01-04 MED ORDER — LEVOTHYROXINE SODIUM 88 MCG PO TABS
88.0000 ug | ORAL_TABLET | Freq: Every day | ORAL | Status: DC
  Administered 2018-01-05 – 2018-01-07 (×3): 88 ug via ORAL
  Filled 2018-01-04 (×3): qty 1

## 2018-01-04 MED ORDER — SCOPOLAMINE 1 MG/3DAYS TD PT72
MEDICATED_PATCH | TRANSDERMAL | Status: DC | PRN
  Administered 2018-01-04: 1 via TRANSDERMAL

## 2018-01-04 MED ORDER — ONDANSETRON HCL 4 MG PO TABS: 4.0000 mg | ORAL_TABLET | Freq: Four times a day (QID) | ORAL | Status: DC | PRN

## 2018-01-04 MED ORDER — BISACODYL 10 MG RE SUPP: 10.0000 mg | Freq: Every day | RECTAL | Status: DC | PRN

## 2018-01-04 MED ORDER — EPHEDRINE SULFATE 50 MG/ML IJ SOLN
INTRAMUSCULAR | Status: DC | PRN
  Administered 2018-01-04: 10 mg via INTRAVENOUS
  Administered 2018-01-04: 20 mg via INTRAVENOUS

## 2018-01-04 MED ORDER — ACETAMINOPHEN 10 MG/ML IV SOLN
INTRAVENOUS | Status: AC
  Filled 2018-01-04: qty 100

## 2018-01-04 MED ORDER — PRAVASTATIN SODIUM 20 MG PO TABS
20.0000 mg | ORAL_TABLET | Freq: Every day | ORAL | Status: DC
  Administered 2018-01-04 – 2018-01-06 (×2): 20 mg via ORAL
  Filled 2018-01-04 (×2): qty 1

## 2018-01-04 MED ORDER — 0.9 % SODIUM CHLORIDE (POUR BTL) OPTIME
TOPICAL | Status: DC | PRN
  Administered 2018-01-04: 1000 mL

## 2018-01-04 MED ORDER — ROCURONIUM BROMIDE 50 MG/5ML IV SOSY
PREFILLED_SYRINGE | INTRAVENOUS | Status: AC
  Filled 2018-01-04: qty 5

## 2018-01-04 MED ORDER — HYDROMORPHONE HCL 1 MG/ML IJ SOLN
0.2500 mg | INTRAMUSCULAR | Status: DC | PRN
  Administered 2018-01-04 (×2): 0.25 mg via INTRAVENOUS

## 2018-01-04 MED ORDER — MORPHINE SULFATE (PF) 2 MG/ML IV SOLN
2.0000 mg | INTRAVENOUS | Status: DC | PRN
  Administered 2018-01-04: 2 mg via INTRAVENOUS
  Filled 2018-01-04: qty 1

## 2018-01-04 MED ORDER — MENTHOL 3 MG MT LOZG: 1.0000 | LOZENGE | OROMUCOSAL | Status: DC | PRN

## 2018-01-04 MED ORDER — CEFAZOLIN SODIUM-DEXTROSE 2-4 GM/100ML-% IV SOLN
2.0000 g | Freq: Three times a day (TID) | INTRAVENOUS | Status: AC
  Administered 2018-01-04 – 2018-01-05 (×2): 2 g via INTRAVENOUS
  Filled 2018-01-04 (×2): qty 100

## 2018-01-04 MED ORDER — LIDOCAINE 2% (20 MG/ML) 5 ML SYRINGE
INTRAMUSCULAR | Status: DC | PRN
  Administered 2018-01-04: 100 mg via INTRAVENOUS

## 2018-01-04 MED ORDER — THROMBIN (RECOMBINANT) 20000 UNITS EX SOLR
CUTANEOUS | Status: AC
  Filled 2018-01-04: qty 20000

## 2018-01-04 MED ORDER — ONDANSETRON HCL 4 MG/2ML IJ SOLN
INTRAMUSCULAR | Status: AC
  Filled 2018-01-04: qty 2

## 2018-01-04 MED ORDER — VENLAFAXINE HCL ER 37.5 MG PO CP24
37.5000 mg | ORAL_CAPSULE | Freq: Every day | ORAL | Status: DC
  Administered 2018-01-05 – 2018-01-07 (×3): 37.5 mg via ORAL
  Filled 2018-01-04 (×3): qty 1

## 2018-01-04 MED ORDER — THROMBIN 20000 UNITS EX SOLR
CUTANEOUS | Status: DC | PRN
  Administered 2018-01-04: 14:00:00 via TOPICAL

## 2018-01-04 MED ORDER — HYDROCODONE-ACETAMINOPHEN 5-325 MG PO TABS
1.0000 | ORAL_TABLET | ORAL | Status: DC | PRN
  Administered 2018-01-04 – 2018-01-07 (×12): 1 via ORAL
  Filled 2018-01-04 (×12): qty 1

## 2018-01-04 MED ORDER — PROPOFOL 10 MG/ML IV BOLUS
INTRAVENOUS | Status: DC | PRN
  Administered 2018-01-04: 130 mg via INTRAVENOUS

## 2018-01-04 MED ORDER — PROPOFOL 500 MG/50ML IV EMUL
INTRAVENOUS | Status: DC | PRN
  Administered 2018-01-04: 15 ug/kg/min via INTRAVENOUS

## 2018-01-04 MED ORDER — SUGAMMADEX SODIUM 500 MG/5ML IV SOLN
INTRAVENOUS | Status: DC | PRN
  Administered 2018-01-04: 128 mg via INTRAVENOUS

## 2018-01-04 MED ORDER — ACETAMINOPHEN 325 MG PO TABS: 650.0000 mg | ORAL_TABLET | ORAL | Status: DC | PRN

## 2018-01-04 MED ORDER — KETOROLAC TROMETHAMINE 15 MG/ML IJ SOLN
7.5000 mg | INTRAMUSCULAR | Status: AC
  Administered 2018-01-04: 7.5 mg via INTRAVENOUS

## 2018-01-04 MED ORDER — PROPOFOL 10 MG/ML IV BOLUS
INTRAVENOUS | Status: AC
  Filled 2018-01-04: qty 20

## 2018-01-04 MED ORDER — EPHEDRINE 5 MG/ML INJ
INTRAVENOUS | Status: AC
  Filled 2018-01-04: qty 10

## 2018-01-04 SURGICAL SUPPLY — 69 items
ADH SKN CLS APL DERMABOND .7 (GAUZE/BANDAGES/DRESSINGS) ×1
APL SRG 60D 8 XTD TIP BNDBL (TIP)
BAG DECANTER FOR FLEXI CONT (MISCELLANEOUS) ×3 IMPLANT
BASKET BONE COLLECTION (BASKET) ×3 IMPLANT
BLADE CLIPPER SURG (BLADE) IMPLANT
BONE CANC CHIPS 20CC PCAN1/4 (Bone Implant) ×3 IMPLANT
BUR MATCHSTICK NEURO 3.0 LAGG (BURR) ×3 IMPLANT
CAGE PLIF 8X9X23-12 LUMBAR (Cage) ×4 IMPLANT
CANISTER SUCT 3000ML PPV (MISCELLANEOUS) ×3 IMPLANT
CHIPS CANC BONE 20CC PCAN1/4 (Bone Implant) ×1 IMPLANT
CONT SPEC 4OZ CLIKSEAL STRL BL (MISCELLANEOUS) ×3 IMPLANT
COVER BACK TABLE 60X90IN (DRAPES) ×3 IMPLANT
DECANTER SPIKE VIAL GLASS SM (MISCELLANEOUS) ×5 IMPLANT
DERMABOND ADVANCED (GAUZE/BANDAGES/DRESSINGS) ×2
DERMABOND ADVANCED .7 DNX12 (GAUZE/BANDAGES/DRESSINGS) ×1 IMPLANT
DEVICE DISSECT PLASMABLAD 3.0S (MISCELLANEOUS) ×1 IMPLANT
DRAPE C-ARM 42X72 X-RAY (DRAPES) ×6 IMPLANT
DRAPE HALF SHEET 40X57 (DRAPES) IMPLANT
DRAPE LAPAROTOMY 100X72X124 (DRAPES) ×3 IMPLANT
DURAPREP 26ML APPLICATOR (WOUND CARE) ×3 IMPLANT
DURASEAL APPLICATOR TIP (TIP) IMPLANT
DURASEAL SPINE SEALANT 3ML (MISCELLANEOUS) IMPLANT
ELECT REM PT RETURN 9FT ADLT (ELECTROSURGICAL) ×3
ELECTRODE REM PT RTRN 9FT ADLT (ELECTROSURGICAL) ×1 IMPLANT
GAUZE 4X4 16PLY RFD (DISPOSABLE) IMPLANT
GAUZE SPONGE 4X4 12PLY STRL (GAUZE/BANDAGES/DRESSINGS) ×3 IMPLANT
GLOVE BIOGEL PI IND STRL 6.5 (GLOVE) IMPLANT
GLOVE BIOGEL PI IND STRL 7.0 (GLOVE) IMPLANT
GLOVE BIOGEL PI IND STRL 8.5 (GLOVE) ×2 IMPLANT
GLOVE BIOGEL PI INDICATOR 6.5 (GLOVE) ×4
GLOVE BIOGEL PI INDICATOR 7.0 (GLOVE) ×2
GLOVE BIOGEL PI INDICATOR 8.5 (GLOVE) ×4
GLOVE ECLIPSE 8.5 STRL (GLOVE) ×6 IMPLANT
GLOVE SURG SS PI 6.5 STRL IVOR (GLOVE) ×4 IMPLANT
GOWN STRL REUS W/ TWL LRG LVL3 (GOWN DISPOSABLE) IMPLANT
GOWN STRL REUS W/ TWL XL LVL3 (GOWN DISPOSABLE) IMPLANT
GOWN STRL REUS W/TWL 2XL LVL3 (GOWN DISPOSABLE) ×8 IMPLANT
GOWN STRL REUS W/TWL LRG LVL3 (GOWN DISPOSABLE) ×3
GOWN STRL REUS W/TWL XL LVL3 (GOWN DISPOSABLE) ×3
GRAFT BNE CANC CHIPS 1-8 20CC (Bone Implant) IMPLANT
HEMOSTAT POWDER KIT SURGIFOAM (HEMOSTASIS) IMPLANT
KIT BASIN OR (CUSTOM PROCEDURE TRAY) ×3 IMPLANT
KIT INFUSE X SMALL 1.4CC (Orthopedic Implant) ×2 IMPLANT
KIT TURNOVER KIT B (KITS) ×3 IMPLANT
MILL MEDIUM DISP (BLADE) ×3 IMPLANT
NDL SPNL 22GX3.5 QUINCKE BK (NEEDLE) IMPLANT
NEEDLE HYPO 22GX1.5 SAFETY (NEEDLE) ×3 IMPLANT
NEEDLE SPNL 22GX3.5 QUINCKE BK (NEEDLE) ×3 IMPLANT
NS IRRIG 1000ML POUR BTL (IV SOLUTION) ×3 IMPLANT
PACK LAMINECTOMY NEURO (CUSTOM PROCEDURE TRAY) ×3 IMPLANT
PAD ARMBOARD 7.5X6 YLW CONV (MISCELLANEOUS) ×9 IMPLANT
PATTIES SURGICAL .5 X1 (DISPOSABLE) ×3 IMPLANT
PLASMABLADE 3.0S (MISCELLANEOUS) ×3
ROD RELINE 5.5X90MM LORDOTIC (Rod) IMPLANT
ROD RELINE-O 5.5X100 LORD (Rod) ×4 IMPLANT
SCREW LOCK RELINE 5.5 TULIP (Screw) ×4 IMPLANT
SCREW RELINE-O POLY 6.5X45 (Screw) ×4 IMPLANT
SPONGE LAP 4X18 RFD (DISPOSABLE) IMPLANT
SPONGE SURGIFOAM ABS GEL 100 (HEMOSTASIS) ×3 IMPLANT
SUT PROLENE 6 0 BV (SUTURE) IMPLANT
SUT VIC AB 1 CT1 18XBRD ANBCTR (SUTURE) ×1 IMPLANT
SUT VIC AB 1 CT1 8-18 (SUTURE) ×3
SUT VIC AB 2-0 CP2 18 (SUTURE) ×3 IMPLANT
SUT VIC AB 3-0 SH 8-18 (SUTURE) ×5 IMPLANT
SYR 3ML LL SCALE MARK (SYRINGE) ×10 IMPLANT
TOWEL GREEN STERILE (TOWEL DISPOSABLE) ×3 IMPLANT
TOWEL GREEN STERILE FF (TOWEL DISPOSABLE) ×3 IMPLANT
TRAY FOLEY MTR SLVR 16FR STAT (SET/KITS/TRAYS/PACK) ×3 IMPLANT
WATER STERILE IRR 1000ML POUR (IV SOLUTION) ×3 IMPLANT

## 2018-01-04 NOTE — Op Note (Signed)
Date of surgery: January 04, 2018 Preoperative diagnosis: Lumbar spinal stenosis L1-L2 history of decompression and fusion L2 to sacrum lumbar radiculopathy.  Neurogenic claudication. Postoperative diagnosis: Same Procedure: Compression of L1 and L2 with laminectomy and more work than simple interbody arthrodesis.  Posterior lumbar interbody arthrodesis with peek spacers local autograft allograft and infuse L1-L2 pedicle screw fixation L1 to previous construct to L5 posterior lateral arthrodesis with local autograft allograft and infuse. Surgeon: Kristeen Miss First assistant: Deri Fuelling, MD Anesthesia: General endotracheal Indications: Jeanette [name] is a 72 year old individuals been a long-standing patient in the practice she has had previous spondylolisthesis and degenerative changes at L4-5 initially and subsequently had adjacent level disease and ultimately had decompression and fusion from L2 down to the sacrum.  She has recently developed adjacent level disease at L1-L2 with retrolisthesis of L1 and L2 secondary to decreased lordosis in her lumbar spine.  She is now being taken to the operating room to undergo surgical decompression and stabilization of L1-L2.  Procedure: Patient was brought to the operating room supine on the stretcher.  After the smooth induction of general endotracheal anesthesia, she is carefully turned prone.  The back was prepped with alcohol DuraPrep and draped in a sterile fashion.  The previously made midline incision was opened and dissected down to the old hardware.  Then the level immediately above was isolated and laminectomy was undertaken removing the inferior margin lamina of L1-L2 and including the entirety of the facet at L1-L2.  This allowed for good decompression of the central dural tube and the takeoff of the L1 nerve root superiorly and the L2 nerve root inferiorly.  Dissection was completed bilaterally.  Discectomy was then performed after the disc space was  isolated there is noted to be a significant herniation of disc material off to the right side.  This was carefully removed.  The disc space was then opened further and dissected completely of a substantial quantity of severely degenerated desiccated disc material.  Ultimately an interbody spacer could be placed to size this interspace and is felt that a 12 degree lordotic 8 mm tall 25 mm long spacer would fit best into this interval.  The endplates were carefully then decorticated using a toothed curette.  Autograft from the laminectomy mixed with allograft of 20 cc of cancellus bone chips and an extra small mixture of infuse were then mixed together and this was placed into the interspace.  A total of 9 cc was placed into the L1-L2 interspace along with the 2 cages.  Pedicle entry sites were then chosen at L1 and under fluoroscopic control 6.5 x 45 mm screws were placed into L1.  The L2 screws were removed and the hardware down to L5 was removed.  The L4 and L5 screws were then left in place and 100 mm rod was contoured to fit between L1 and L5.  This was then tightened down in a neutral construct.  Final radiographs demonstrated good placement of the hardware.  Lateral gutters had been decorticated and these were then packed with 3 cc of the autograft allograft and infuse mixture.  With this the lumbodorsal fascia was reapproximated with #1 Vicryl 2-0 Vicryl was used in subtenons tissues and 3-0 Vicryl subcuticularly blood loss was estimated 300 cc and the patient was ultimately returned to recovery room in stable condition.

## 2018-01-04 NOTE — Anesthesia Preprocedure Evaluation (Signed)
Anesthesia Evaluation  Patient identified by MRN, date of birth, ID band Patient awake    Reviewed: Allergy & Precautions, H&P , NPO status , Patient's Chart, lab work & pertinent test results  History of Anesthesia Complications (+) PONV and history of anesthetic complications  Airway Mallampati: II  TM Distance: <3 FB Neck ROM: Limited    Dental no notable dental hx.    Pulmonary asthma , pneumonia, resolved,    Pulmonary exam normal breath sounds clear to auscultation       Cardiovascular hypertension, Pt. on medications Normal cardiovascular exam Rhythm:Regular Rate:Normal     Neuro/Psych Anxiety Depression TIACVA    GI/Hepatic negative GI ROS, Neg liver ROS,   Endo/Other  Hypothyroidism   Renal/GU negative Renal ROS     Musculoskeletal   Abdominal Normal abdominal exam  (+)   Peds  Hematology   Anesthesia Other Findings   Reproductive/Obstetrics                             Anesthesia Physical  Anesthesia Plan  ASA: II  Anesthesia Plan: General   Post-op Pain Management:    Induction: Intravenous  PONV Risk Score and Plan: 4 or greater  Airway Management Planned: Oral ETT  Additional Equipment:   Intra-op Plan:   Post-operative Plan: Extubation in OR  Informed Consent: I have reviewed the patients History and Physical, chart, labs and discussed the procedure including the risks, benefits and alternatives for the proposed anesthesia with the patient or authorized representative who has indicated his/her understanding and acceptance.   Dental advisory given  Plan Discussed with: CRNA  Anesthesia Plan Comments:         Anesthesia Quick Evaluation

## 2018-01-04 NOTE — Anesthesia Procedure Notes (Signed)
Procedure Name: Intubation Date/Time: 01/04/2018 12:54 PM Performed by: Clearnce Sorrel, CRNA Pre-anesthesia Checklist: Patient identified, Emergency Drugs available, Suction available, Patient being monitored and Timeout performed Patient Re-evaluated:Patient Re-evaluated prior to induction Oxygen Delivery Method: Circle system utilized Preoxygenation: Pre-oxygenation with 100% oxygen Induction Type: IV induction Ventilation: Mask ventilation without difficulty Laryngoscope Size: Mac and 3 Grade View: Grade I Tube type: Oral Tube size: 7.0 mm Number of attempts: 1 Airway Equipment and Method: Stylet Placement Confirmation: ETT inserted through vocal cords under direct vision,  positive ETCO2 and breath sounds checked- equal and bilateral Secured at: 21 cm Tube secured with: Tape Dental Injury: Teeth and Oropharynx as per pre-operative assessment

## 2018-01-04 NOTE — Progress Notes (Signed)
Vital signs show blood pressure to be in the 80s to 90s.  Heart rate is good.  Patient is having moderate amount of pain.  Will add increased dose of Toradol.Patient ID: Jeanette [name], female   DOB: Nov 03, 1945, 72 y.o.   MRN: [id number]

## 2018-01-04 NOTE — H&P (Signed)
Jeanette [name] is an 72 y.o. female.   Chief Complaint: Back and bilateral leg pain HPI: Jeanette [name] is a 72 year old individual who is had extensive lumbar spinal surgery in the past over 25 years.  She has had an arthrodesis about 6 years ago at L2-L3.  She now has developed adjacent level disease at L1-L2 with retrolisthesis at that level.  Her lower lumbar spine fusion had started to create a flat back syndrome and at the time of her last surgery she had substantial lordosis placed at the L2-L3 level.  She now has retrolisthesis at L1-L2 and decompression and stabilization will require fixation with substantial lordosis at that level.  Past Medical History:  Diagnosis Date  . Arthritis   . Asthma    hx  . Hypertension   . Hypothyroidism   . Neuromuscular disorder (HCC)    tremors in hands  . Pneumonia    hx  . PONV (postoperative nausea and vomiting)    has scop patch to apply  10/13/12  . Stroke [facility]) 2011   tia's  . Thyroid disease   . TIA (transient ischemic attack)     Past Surgical History:  Procedure Laterality Date  . ANTERIOR CERVICAL DECOMP/DISCECTOMY FUSION N/A 10/14/2012   Procedure: ANTERIOR CERVICAL DECOMPRESSION/DISCECTOMY FUSION 1 LEVEL/HARDWARE REMOVAL;  Surgeon: Kristeen Miss, MD;  Location: [place] [facility];  Service: Neurosurgery;  Laterality: N/A;  C7-T1 Anterior cervical decompression/diskectomy/fusion/removal of old synthes plate  . BACK SURGERY  01  . DILATION AND CURETTAGE OF UTERUS    . HYSTEROSCOPY  2004, 2007   endometrial polyp  . KNEE SURGERY Right    72 yrs old  . NASAL SINUS SURGERY Right 08/2016   polyp removed  . NECK SURGERY  07,03   cerv,Fusion  . ROTATOR CUFF REPAIR Right 2015  . TONSILLECTOMY      Family History  Problem Relation Age of Onset  . Leukemia Mother   . Ovarian cancer Mother   . Tremor Mother   . Polycythemia Mother   . Hypertension Father   . Heart disease Father   . Parkinson's disease Maternal Aunt   . Stroke Maternal  Aunt   . Parkinson's disease Maternal Uncle   . Multiple sclerosis Maternal Uncle   . Colon cancer Neg Hx    Social History:  reports that she has never smoked. She has never used smokeless tobacco. She reports that she drinks about 1.0 standard drinks of alcohol per week. She reports that she does not use drugs.  Allergies:  Allergies  Allergen Reactions  . Compazine [Prochlorperazine Edisylate] Anaphylaxis and Swelling    Mouth and tongue swell, throat swells closed    No medications prior to admission.    No results found for this or any previous visit (from the past 48 hour(s)). No results found.  Review of Systems  Constitutional: Negative.   HENT: Negative.   Respiratory: Negative.   Cardiovascular: Negative.   Gastrointestinal: Negative.   Genitourinary: Negative.   Musculoskeletal: Positive for back pain.  Skin: Negative.   Neurological: Positive for tingling and weakness.  Endo/Heme/Allergies: Negative.   Psychiatric/Behavioral: Negative.     There were no vitals taken for this visit. Physical Exam  Constitutional: She is oriented to person, place, and time. She appears well-developed and well-nourished.  HENT:  Head: Normocephalic and atraumatic.  Eyes: Pupils are equal, round, and reactive to light. Conjunctivae and EOM are normal.  Neck:  Decreased range of motion of the cervical spine  secondary to cervical spondylosis and arthrodesis  Cardiovascular: Normal rate and regular rhythm.  Respiratory: Effort normal and breath sounds normal.  GI: Soft. Bowel sounds are normal.  Musculoskeletal:  Positive straight leg raising at 45 degrees Patrick's maneuver is negative bilaterally.  Neurological: She is alert and oriented to person, place, and time.  Some deep tendon reflexes in both lower extremities.  Proximal leg weakness in the iliopsoas graded 4 out of 5.  Station and gait are intact.  Patient tends to prefer a 10 degree forward stoop when she stands  straight and erect walks any distance.  Skin: Skin is warm and dry.  Psychiatric: She has a normal mood and affect. Her behavior is normal. Judgment and thought content normal.     Assessment/Plan Spondylosis and stenosis L1-L2.  Lumbar radiculopathy, neurogenic claudication.  Plan: Decompression L1-L2 with posterior lumbar interbody arthrodesis and pedicle fixation L1-L2.  Earleen Newport, MD 01/04/2018, 7:56 AM

## 2018-01-04 NOTE — Transfer of Care (Signed)
Immediate Anesthesia Transfer of Care Note  Patient: Jeanette [name]  Procedure(s) Performed: Posterior Lumbar Interbody Fusion - Lumbar One-Lumbar Two (N/A Back)  Patient Location: PACU  Anesthesia Type:General  Level of Consciousness: awake  Airway & Oxygen Therapy: Patient Spontanous Breathing and Patient connected to nasal cannula oxygen  Post-op Assessment: Report given to RN and Post -op Vital signs reviewed and stable  Post vital signs: Reviewed and stable  Last Vitals:  Vitals Value Taken Time  BP 80/42 01/04/2018  4:53 PM  Temp    Pulse 67 01/04/2018  4:58 PM  Resp 14 01/04/2018  4:58 PM  SpO2 97 % 01/04/2018  4:58 PM  Vitals shown include unvalidated device data.  Last Pain:  Vitals:   01/04/18 1113  TempSrc:   PainSc: 0-No pain      Patients Stated Pain Goal: 3 (30/07/62 2633)  Complications: No apparent anesthesia complications

## 2018-01-05 ENCOUNTER — Other Ambulatory Visit: Payer: Self-pay

## 2018-01-05 LAB — CBC
HCT: 30.4 % — ABNORMAL LOW (ref 36.0–46.0)
HEMOGLOBIN: 9.4 g/dL — AB (ref 12.0–15.0)
MCH: 29.4 pg (ref 26.0–34.0)
MCHC: 30.9 g/dL (ref 30.0–36.0)
MCV: 95 fL (ref 78.0–100.0)
Platelets: 288 10*3/uL (ref 150–400)
RBC: 3.2 MIL/uL — AB (ref 3.87–5.11)
RDW: 12.7 % (ref 11.5–15.5)
WBC: 15.3 10*3/uL — ABNORMAL HIGH (ref 4.0–10.5)

## 2018-01-05 LAB — BASIC METABOLIC PANEL
Anion gap: 5 (ref 5–15)
BUN: 15 mg/dL (ref 8–23)
CO2: 27 mmol/L (ref 22–32)
Calcium: 8.4 mg/dL — ABNORMAL LOW (ref 8.9–10.3)
Chloride: 107 mmol/L (ref 98–111)
Creatinine, Ser: 0.85 mg/dL (ref 0.44–1.00)
GFR calc Af Amer: >60 mL/min (ref >60)
GLUCOSE: 161 mg/dL — AB (ref 70–99)
POTASSIUM: 4.4 mmol/L (ref 3.5–5.1)
Sodium: 139 mmol/L (ref 135–145)

## 2018-01-05 MED FILL — Thrombin (Recombinant) For Soln 20000 Unit: CUTANEOUS | Qty: 1 | Status: AC

## 2018-01-05 NOTE — Evaluation (Signed)
Physical Therapy Evaluation Patient Details Name: Jeanette [name] MRN: [id number] DOB: August 06, 1945 Today's Date: 01/05/2018   History of Present Illness  Pt is a 72 y/o female now s/p L1-2 laminectomy and pedicle screw fixation. PMHx includes previous ACDF in 10/2012, hx of back surgery, HTN, TIA, rotator cuff repair   Clinical Impression  Pt is at or close to baseline functioning and should be safe at home with husband. There are no further acute PT needs.  Will sign off at this time.     Follow Up Recommendations No PT follow up;Supervision/Assistance - 24 hour    Equipment Recommendations  None recommended by PT    Recommendations for Other Services       Precautions / Restrictions Precautions Precautions: Back Required Braces or Orthoses: Spinal Brace Spinal Brace: Applied in sitting position;Lumbar corset      Mobility  Bed Mobility Overal bed mobility: Needs Assistance Bed Mobility: Rolling;Sidelying to Sit;Sit to Sidelying Rolling: Min guard Sidelying to sit: Min guard     Sit to sidelying: Min guard General bed mobility comments: demo'd best technique and cued pt.  not assist needed.  Transfers Overall transfer level: Needs assistance Equipment used: Rolling walker (2 wheeled) Transfers: Sit to/from Stand Sit to Stand: Min guard         General transfer comment: cues for safest hand placement  Ambulation/Gait Ambulation/Gait assistance: Min guard Gait Distance (Feet): 250 Feet Assistive device: Rolling walker (2 wheeled) Gait Pattern/deviations: Step-through pattern Gait velocity: slower Gait velocity interpretation: 1.31 - 2.62 ft/sec, indicative of limited community ambulator General Gait Details: steady gait overall, slower  Stairs Stairs: Yes Stairs assistance: Supervision Stair Management: One rail Right;Step to pattern;Forwards Number of Stairs: 3 General stair comments: safe with the rail  Wheelchair Mobility    Modified Rankin (Stroke  Patients Only)       Balance Overall balance assessment: Needs assistance Sitting-balance support: No upper extremity supported Sitting balance-Leahy Scale: Good       Standing balance-Leahy Scale: Good                               Pertinent Vitals/Pain Pain Assessment: 0-10 Pain Score: 4  Pain Location: back Pain Descriptors / Indicators: Discomfort Pain Intervention(s): Monitored during session    Home Living Family/patient expects to be discharged to:: Private residence Living Arrangements: Spouse/significant other Available Help at Discharge: Family Type of Home: House Home Access: Stairs to enter Entrance Stairs-Rails: Right Entrance Stairs-Number of Steps: 5 Home Layout: One level Home Equipment: Environmental consultant - 2 wheels;Bedside commode;Shower seat - built in      Prior Function Level of Independence: Independent               Hand Dominance   Dominant Hand: Right    Extremity/Trunk Assessment   Upper Extremity Assessment Upper Extremity Assessment: Overall WFL for tasks assessed    Lower Extremity Assessment Lower Extremity Assessment: Overall WFL for tasks assessed       Communication   Communication: No difficulties  Cognition Arousal/Alertness: Awake/alert Behavior During Therapy: WFL for tasks assessed/performed Overall Cognitive Status: Within Functional Limits for tasks assessed                                        General Comments General comments (skin integrity, edema, etc.): pt instructed in back care/prec incl  bed mobility, bracing issues, lifting restrictions and progression of activity.    Exercises     Assessment/Plan    PT Assessment Patent does not need any further PT services  PT Problem List         PT Treatment Interventions      PT Goals (Current goals can be found in the Care Plan section)  Acute Rehab PT Goals PT Goal Formulation: All assessment and education complete, DC  therapy    Frequency     Barriers to discharge        Co-evaluation               AM-PAC PT "6 Clicks" Daily Activity  Outcome Measure Difficulty turning over in bed (including adjusting bedclothes, sheets and blankets)?: None Difficulty moving from lying on back to sitting on the side of the bed? : A Little Difficulty sitting down on and standing up from a chair with arms (e.g., wheelchair, bedside commode, etc,.)?: None Help needed moving to and from a bed to chair (including a wheelchair)?: A Little Help needed walking in hospital room?: A Little Help needed climbing 3-5 steps with a railing? : A Little 6 Click Score: 20    End of Session Equipment Utilized During Treatment: Back brace Activity Tolerance: Patient tolerated treatment well Patient left: in chair;with call bell/phone within reach;with family/visitor present Nurse Communication: Mobility status PT Visit Diagnosis: Other abnormalities of gait and mobility (R26.89);Pain Pain - part of body: (back)    Time:  -1053     Charges:   PT Evaluation $PT Eval Low Complexity: 1 Low          01/05/2018  Donnella Sham, PT [phone]  (pager)  Tessie Fass Marua Qin 01/05/2018, 11:24 AM

## 2018-01-05 NOTE — Progress Notes (Signed)
Occupational Therapy Treatment Patient Details Name: Jeanette [name] MRN: [id number] DOB: [birth year] Today's Date: 01/05/2018    History of present illness Pt is a 72 y/o female now s/p L1-2 laminectomy and pedicle screw fixation. PMHx includes previous ACDF in 10/2012, hx of back surgery, HTN, TIA, rotator cuff repair    OT comments  Pt seen for additional session as pt requesting assist to don clothing items. Educated pt on use of AE for completing LB ADL during task completion with pt return demonstrating with cues for technique and light minA. Pt performing sit<>stand with minguard assist during task completion. Feel POC remains appropriate. Will continue to follow acutely to progress pt towards established OT goals.   Follow Up Recommendations  No OT follow up;Supervision/Assistance - 24 hour    Equipment Recommendations  None recommended by OT          Precautions / Restrictions Precautions Precautions: Back Precaution Booklet Issued: Yes (comment) Required Braces or Orthoses: Spinal Brace Spinal Brace: Applied in sitting position;Lumbar corset Restrictions Weight Bearing Restrictions: No       Mobility Bed Mobility               General bed mobility comments: OOB in recliner upon arrival   Transfers Overall transfer level: Needs assistance Equipment used: Rolling walker (2 wheeled) Transfers: Sit to/from Stand Sit to Stand: Total assist         General transfer comment: cues for safest hand placement    Balance Overall balance assessment: Needs assistance Sitting-balance support: No upper extremity supported Sitting balance-Leahy Scale: Good     Standing balance support: During functional activity Standing balance-Leahy Scale: Fair                             ADL either performed or assessed with clinical judgement   ADL Overall ADL's : Needs assistance/impaired                     Lower Body Dressing: Minimal assistance;Sit  to/from stand;With adaptive equipment Lower Body Dressing Details (indicate cue type and reason): reviewed use of reacher and sock aide for completion of LB dressing; pt return demonstrating to don pajama pants and socks with min cues and light minA             Functional mobility during ADLs: Min guard;Rolling walker General ADL Comments: pt seen for additional session as pt requesting to don pajamas. reviewed use of AE for completing dressing ADLs during task completion.      Vision       Perception     Praxis      Cognition Arousal/Alertness: Awake/alert Behavior During Therapy: WFL for tasks assessed/performed Overall Cognitive Status: Within Functional Limits for tasks assessed                                          Exercises     Shoulder Instructions       General Comments      Pertinent Vitals/ Pain       Pain Assessment: Faces Faces Pain Scale: Hurts even more Pain Location: back with certain movements  Pain Descriptors / Indicators: Discomfort;Grimacing Pain Intervention(s): Monitored during session;Repositioned  Home Living  Prior Functioning/Environment              Frequency  Min 2X/week        Progress Toward Goals  OT Goals(current goals can now be found in the care plan section)  Progress towards OT goals: Progressing toward goals  Acute Rehab OT Goals Patient Stated Goal: regain independence  OT Goal Formulation: With patient Time For Goal Achievement: 01/19/18 Potential to Achieve Goals: Good ADL Goals Pt Will Perform Grooming: with modified independence;standing Pt Will Perform Lower Body Bathing: with supervision;sit to/from stand;with adaptive equipment Pt Will Perform Upper Body Dressing: with set-up;sitting Pt Will Perform Lower Body Dressing: with supervision;with adaptive equipment;sit to/from stand;with caregiver independent in assisting(AE vs  caregiver assist) Pt Will Transfer to Toilet: with modified independence;ambulating;bedside commode(over toilet) Pt Will Perform Toileting - Clothing Manipulation and hygiene: with modified independence;sit to/from stand Pt Will Perform Tub/Shower Transfer: Shower transfer;with supervision;ambulating;shower seat;rolling walker Additional ADL Goal #1: Pt will independently verbalize 3/3 back precautions.  Plan Discharge plan remains appropriate    Co-evaluation                 AM-PAC PT "6 Clicks" Daily Activity     Outcome Measure   Help from another person eating meals?: None Help from another person taking care of personal grooming?: A Little Help from another person toileting, which includes using toliet, bedpan, or urinal?: A Little Help from another person bathing (including washing, rinsing, drying)?: A Lot Help from another person to put on and taking off regular upper body clothing?: A Little Help from another person to put on and taking off regular lower body clothing?: A Lot 6 Click Score: 17    End of Session Equipment Utilized During Treatment: Back brace  OT Visit Diagnosis: Unsteadiness on feet (R26.81)   Activity Tolerance Patient tolerated treatment well   Patient Left in chair;with call bell/phone within reach;with family/visitor present   Nurse Communication Mobility status        Time: 6812-7517 OT Time Calculation (min): 19 min  Charges: OT General Charges $OT Visit: 1 Visit OT Evaluation $OT Eval Low Complexity: 1 Low OT Treatments $Self Care/Home Management : 8-22 mins  Lou Cal, OT Pager [phone] 01/05/2018    [name] 01/05/2018, 4:27 PM

## 2018-01-05 NOTE — Progress Notes (Signed)
Patient ID: Jeanette [name], female   DOB: [birth year], 72 y.o.   MRN: [id number] Vital signs appears stable Patient appears to be doing well sitting out of bed having breakfast this morning Dressings are dry and intact Motor function appears intact Foley is out We will see how she does with mobilization today Patient's current brace is adequate

## 2018-01-05 NOTE — Evaluation (Signed)
Occupational Therapy Evaluation Patient Details Name: Jeanette [name] MRN: [id number] DOB: Feb 13, 1946 Today's Date: 01/05/2018    History of Present Illness Pt is a 72 y/o female now s/p L1-2 laminectomy and pedicle screw fixation. PMHx includes previous ACDF in 10/2012, hx of back surgery, HTN, TIA, rotator cuff repair    Clinical Impression   This 72 y/o female presents with the above. At baseline pt is independent with ADLs and functional mobility, lives with spouse who pt reports is able to assist with ADLs PRN after return home. Pt demonstrating room level functional mobility using RW with minguard-supervision this session. Currently requires minguard-minA for standing grooming and toileting ADLs, modA for LB ADLs secondary to adhering to back precautions. Began education regarding back precautions, AE, safety and compensatory strategies for completing ADLs while maintaining precautions with pt verbalizing understanding. Pt will benefit from continued OT services while in acute setting to review use of AE for LB ADLs and to maximize pt's overall safety and independence with ADLs and mobility. Will follow.     Follow Up Recommendations  No OT follow up;Supervision/Assistance - 24 hour    Equipment Recommendations  None recommended by OT;Other (comment)(Pt's DME needs are met )           Precautions / Restrictions Precautions Precautions: Back Precaution Booklet Issued: Yes (comment) Required Braces or Orthoses: Spinal Brace Spinal Brace: Applied in sitting position;Lumbar corset Restrictions Weight Bearing Restrictions: No      Mobility Bed Mobility Overal bed mobility: Needs Assistance Bed Mobility: Rolling;Sidelying to Sit;Sit to Sidelying Rolling: Min guard Sidelying to sit: Min guard     Sit to sidelying: Min guard General bed mobility comments: OOB in recliner upon arrival   Transfers Overall transfer level: Needs assistance Equipment used: Rolling walker (2  wheeled) Transfers: Sit to/from Stand Sit to Stand: Min guard         General transfer comment: cues for safest hand placement    Balance Overall balance assessment: Needs assistance Sitting-balance support: No upper extremity supported Sitting balance-Leahy Scale: Good       Standing balance-Leahy Scale: Good                             ADL either performed or assessed with clinical judgement   ADL Overall ADL's : Needs assistance/impaired Eating/Feeding: Independent;Sitting   Grooming: Wash/dry hands;Min guard;Standing   Upper Body Bathing: Min guard;Sitting   Lower Body Bathing: Minimal assistance;Sit to/from stand   Upper Body Dressing : Set up;Sitting;Standing Upper Body Dressing Details (indicate cue type and reason): setup assist to don spinal brace  Lower Body Dressing: Moderate assistance;Sit to/from stand   Toilet Transfer: Min guard;Supervision/safety;Ambulation;BSC;RW Toilet Transfer Details (indicate cue type and reason): BSC over toilet  Toileting- Clothing Manipulation and Hygiene: Minimal assistance;Sit to/from stand Toileting - Clothing Manipulation Details (indicate cue type and reason): for gown management    Tub/Shower Transfer Details (indicate cue type and reason): educated on use of built in shower seat during task completion to increase safety and to increase ease of adhering to back precautions Functional mobility during ADLs: Min guard;Supervision/safety;Rolling walker General ADL Comments: began education regarding back precautions, brace management, AE, safety and compensatory strategies for completing ADLs while maintaining precautions      Vision         Perception     Praxis      Pertinent Vitals/Pain Pain Assessment: Faces Pain Score: 4  Faces Pain Scale:  Hurts little more Pain Location: back Pain Descriptors / Indicators: Discomfort Pain Intervention(s): Monitored during session;Premedicated before session      Hand Dominance Right   Extremity/Trunk Assessment Upper Extremity Assessment Upper Extremity Assessment: Overall WFL for tasks assessed   Lower Extremity Assessment Lower Extremity Assessment: Defer to PT evaluation       Communication Communication Communication: No difficulties   Cognition Arousal/Alertness: Awake/alert Behavior During Therapy: WFL for tasks assessed/performed Overall Cognitive Status: Within Functional Limits for tasks assessed                                     General Comments  pt instructed in back care/prec incl bed mobility, bracing issues, lifting restrictions and progression of activity.    Exercises     Shoulder Instructions      Home Living Family/patient expects to be discharged to:: Private residence Living Arrangements: Spouse/significant other Available Help at Discharge: Family Type of Home: House Home Access: Stairs to enter Technical brewer of Steps: 5 Entrance Stairs-Rails: Right Home Layout: One level     Bathroom Shower/Tub: Occupational psychologist: [place]: Environmental consultant - 2 wheels;Bedside commode;Shower seat - built in          Prior Functioning/Environment Level of Independence: Independent                 OT Problem List: Decreased range of motion;Decreased strength;Pain;Decreased knowledge of precautions;Decreased knowledge of use of DME or AE      OT Treatment/Interventions: Self-care/ADL training;DME and/or AE instruction;Therapeutic activities;Balance training;Therapeutic exercise;Patient/family education    OT Goals(Current goals can be found in the care plan section) Acute Rehab OT Goals Patient Stated Goal: regain independence  OT Goal Formulation: With patient Time For Goal Achievement: 01/19/18 Potential to Achieve Goals: Good  OT Frequency: Min 2X/week   Barriers to D/C:            Co-evaluation              AM-PAC PT "6 Clicks"  Daily Activity     Outcome Measure Help from another person eating meals?: None Help from another person taking care of personal grooming?: A Little Help from another person toileting, which includes using toliet, bedpan, or urinal?: A Little Help from another person bathing (including washing, rinsing, drying)?: A Lot Help from another person to put on and taking off regular upper body clothing?: A Little Help from another person to put on and taking off regular lower body clothing?: A Lot 6 Click Score: 17   End of Session Equipment Utilized During Treatment: Rolling walker;Back brace Nurse Communication: Mobility status  Activity Tolerance: Patient tolerated treatment well Patient left: in chair;with call bell/phone within reach;with family/visitor present  OT Visit Diagnosis: Unsteadiness on feet (R26.81)                Time: 1015-1030 OT Time Calculation (min): 15 min Charges:  OT General Charges $OT Visit: 1 Visit OT Evaluation $OT Eval Low Complexity: 1 Low  Lou Cal, OT Pager [phone] 01/05/2018   Jeanette [name] 01/05/2018, 12:29 PM

## 2018-01-05 NOTE — Anesthesia Postprocedure Evaluation (Signed)
Anesthesia Post Note  Patient: [name]  Procedure(s) Performed: Posterior Lumbar Interbody Fusion - Lumbar One-Lumbar Two (N/A Back)     Patient location during evaluation: PACU Anesthesia Type: General Level of consciousness: awake and alert Pain management: pain level controlled Vital Signs Assessment: post-procedure vital signs reviewed and stable Respiratory status: spontaneous breathing, nonlabored ventilation, respiratory function stable and patient connected to nasal cannula oxygen Cardiovascular status: blood pressure returned to baseline and stable Postop Assessment: no apparent nausea or vomiting Anesthetic complications: no    Last Vitals:  Vitals:   01/05/18 1155 01/05/18 1530  BP: (!) 158/126 (!) 84/52  Pulse: 63 72  Resp:    Temp:  36.6 C  SpO2: 97% 99%    Last Pain:  Vitals:   01/05/18 1530  TempSrc: Oral  PainSc:                  Meisha Salone L Emerita Berkemeier

## 2018-01-05 NOTE — Progress Notes (Signed)
Patient taken her synthroid, ambulated with walker to the bathroom, completed her own hygiene independently, and is in the chair waiting on her daughter to bring her "bag of personal hygiene items".   Patient will eat breakfast in the chair.  Pain medication given at 0525 this a.m. And patient denies pain. States "this is the best I have felt after surgery". In asking patient about her history she states "this is my 3rd surgery".   Patient still hooked up to IV fluids as pressures have been soft overnight per 3rd shift Nurse.

## 2018-01-05 NOTE — Social Work (Signed)
CSW acknowledging consult for SNF placement, following for therapy recommendations.  Alexander Mt, [place] Work [phone]

## 2018-01-05 NOTE — Social Work (Signed)
Pt is not recommended for any additional therapy services, aware pt from home with spouse. If pt does not have 24/7 assistance please re-consult.   CSW signing off. Please consult if any additional needs arise.  Alexander Mt, [place] Work [phone]

## 2018-01-06 MED ORDER — MAGNESIUM HYDROXIDE 400 MG/5ML PO SUSP
30.0000 mL | Freq: Every day | ORAL | Status: DC | PRN
  Administered 2018-01-06: 30 mL via ORAL
  Filled 2018-01-06: qty 30

## 2018-01-06 MED ORDER — DEXAMETHASONE 2 MG PO TABS
2.0000 mg | ORAL_TABLET | Freq: Two times a day (BID) | ORAL | Status: DC
  Administered 2018-01-06 – 2018-01-07 (×3): 2 mg via ORAL
  Filled 2018-01-06 (×3): qty 1

## 2018-01-06 NOTE — Progress Notes (Signed)
Patient ID: Jeanette [name], female   DOB: 01-25-1946, 72 y.o.   MRN: [id number] All signs are stable Patient notes considerable more soreness today Moving about slowly and cautiously Bowels have not moved Minimal passing of gas Will add low-dose of Decadron to help with pain Plan discharge for tomorrow Add milk of magnesia to help stimulate bowels.

## 2018-01-06 NOTE — Consult Note (Signed)
[facility] Primary Care Navigator  01/06/2018  Jeanette [name] 1945/10/06 [id number]   Met withpatientand husband Jeanette [name]) at the bedsideto identify possible discharge needs. Patientreports having"increased pain to lower back and right hip, and numbness to both legs" that had led to this admission/ surgery. Patient had extensive lumbar spinal surgery in the past. (status post posterior Lumbar Interbody Fusion - L1-L2 on this admission)  Patientendorses Dr. Merrilee [name].   [facility] [address] [facility].   Patientreportsmanagingherown medications at home straight out of the containers.  Patient verbalized that she has been driving prior to admission/ surgery but statesthathusband will be providingtransportation to her doctors' appointments after discharge.  Patientliveswith husband who is the primary caregiver for her at home.  Anticipateddischarge planishomeaccording to patient.  Patientvoiced understanding to call primary care provider's officewhen she returns homefor a post discharge follow-up visit within1- 2weeksor sooner if needs arise. Patient letter (with PCP's contact number) was provided asareminder.   Discussed with patient and husband about[facility] CM services available for health managementand resourcesat home butboth denied any needsor concerns at this point.Patient states being able to manage her health issues well at home with diet, exercise, medications and follow-up with provider when needed.   Encouraged patient and husbandto seekreferral from primary care provider to [facility] care management if deemednecessaryand appropriate for any services in the future.  [facility] care management information was provided for future needs thatshemay have.  Patienthowever, opted and verbally  agreed forEMMI calls to follow-upwith herrecovery at home.  Referral made for [facility] General calls after discharge.   For additional questions please contact:  Jeanette [name], BSN, RN-BC [facility] PRIMARY CARE Navigator Cell: [phone]

## 2018-01-06 NOTE — Progress Notes (Signed)
Occupational Therapy Treatment Patient Details Name: Jeanette [name] MRN: [id number] DOB: 1946-04-27 Today's Date: 01/06/2018    History of present illness Pt is a 72 y/o female now s/p L1-2 laminectomy and pedicle screw fixation. PMHx includes previous ACDF in 10/2012, hx of back surgery, HTN, TIA, rotator cuff repair    OT comments  Pt with increased soreness from sx today. Focused on toilet transfer, LB ADL and peri care followed by sink level grooming and bed mobility. Pt demonstrating carryover from previous therapy sessions. POC remains appropriate.   Follow Up Recommendations  No OT follow up;Supervision/Assistance - 24 hour    Equipment Recommendations  None recommended by OT    Recommendations for Other Services      Precautions / Restrictions Precautions Precautions: Back Precaution Booklet Issued: Yes (comment) Required Braces or Orthoses: Spinal Brace Spinal Brace: Applied in sitting position;Lumbar corset Restrictions Weight Bearing Restrictions: No       Mobility Bed Mobility Overal bed mobility: Needs Assistance   Rolling: Min guard       Sit to sidelying: Min guard General bed mobility comments: Pt able to manage BLE back into bed  Transfers Overall transfer level: Needs assistance Equipment used: Rolling walker (2 wheeled) Transfers: Sit to/from Stand Sit to Stand: Min guard         General transfer comment: good carryover of hand placement from previous therapy sessions    Balance Overall balance assessment: Needs assistance Sitting-balance support: No upper extremity supported Sitting balance-Leahy Scale: Good       Standing balance-Leahy Scale: Good                             ADL either performed or assessed with clinical judgement   ADL Overall ADL's : Needs assistance/impaired     Grooming: Wash/dry hands;Min guard;Standing Grooming Details (indicate cue type and reason): sink level; re-educated in strategies for ADL  at sink level cup method, setting up on dominant side             Lower Body Dressing: Min guard;Sit to/from stand Lower Body Dressing Details (indicate cue type and reason): able to manage pjs and pants Toilet Transfer: Min guard;Ambulation;RW;Grab bars Toilet Transfer Details (indicate cue type and reason): BSC over toilet  Toileting- Clothing Manipulation and Hygiene: Min guard;Sit to/from stand Toileting - Clothing Manipulation Details (indicate cue type and reason): use of grab bars for stability     Functional mobility during ADLs: Min guard;Rolling walker General ADL Comments: Pt experiencing increased soreness today, pain impacting function slightly good adherence to back precautions     Vision       Perception     Praxis      Cognition Arousal/Alertness: Awake/alert Behavior During Therapy: WFL for tasks assessed/performed Overall Cognitive Status: Within Functional Limits for tasks assessed                                          Exercises     Shoulder Instructions       General Comments      Pertinent Vitals/ Pain       Pain Assessment: 0-10 Pain Score: 7  Pain Location: back Pain Descriptors / Indicators: Discomfort;Sore;Grimacing Pain Intervention(s): Limited activity within patient's tolerance;Monitored during session;Repositioned  Home Living  Prior Functioning/Environment              Frequency  Min 2X/week        Progress Toward Goals  OT Goals(current goals can now be found in the care plan section)  Progress towards OT goals: Progressing toward goals  Acute Rehab OT Goals Patient Stated Goal: regain independence  OT Goal Formulation: With patient Time For Goal Achievement: 01/19/18 Potential to Achieve Goals: Good  Plan Discharge plan remains appropriate    Co-evaluation                 AM-PAC PT "6 Clicks" Daily Activity     Outcome  Measure   Help from another person eating meals?: None Help from another person taking care of personal grooming?: A Little Help from another person toileting, which includes using toliet, bedpan, or urinal?: A Little Help from another person bathing (including washing, rinsing, drying)?: A Lot Help from another person to put on and taking off regular upper body clothing?: A Little Help from another person to put on and taking off regular lower body clothing?: A Lot 6 Click Score: 17    End of Session Equipment Utilized During Treatment: Back brace  OT Visit Diagnosis: Unsteadiness on feet (R26.81)   Activity Tolerance Patient tolerated treatment well   Patient Left in bed;with call bell/phone within reach;with family/visitor present   Nurse Communication Mobility status        Time: 3810-1751 OT Time Calculation (min): 21 min  Charges: OT General Charges $OT Visit: 1 Visit OT Treatments $Self Care/Home Management : 8-22 mins  Hulda Trethewey OTR/L Acute Rehabilitation Services Pager: [phone] Office: [place] 01/06/2018, 5:48 PM

## 2018-01-07 MED ORDER — DEXAMETHASONE 1 MG PO TABS: ORAL_TABLET | ORAL | 0 refills | Status: DC

## 2018-01-07 MED ORDER — HYDROCODONE-ACETAMINOPHEN 5-325 MG PO TABS: 1.0000 | ORAL_TABLET | ORAL | 0 refills | Status: DC | PRN

## 2018-01-07 MED ORDER — METHOCARBAMOL 500 MG PO TABS: 500.0000 mg | ORAL_TABLET | Freq: Four times a day (QID) | ORAL | 3 refills | Status: DC | PRN

## 2018-01-07 NOTE — Care Management Important Message (Signed)
Important Message  Patient Details  Name: Jeanette [name] MRN: [id number] Date of Birth: 1945-06-19   Medicare Important Message Given:  Yes    Ella Bodo, RN 01/07/2018, 4:48 PM

## 2018-01-07 NOTE — Discharge Summary (Signed)
Physician Discharge Summary  Patient ID: Jeanette [name] MRN: [id number] DOB/AGE: April 11, 1946 72 y.o.  Admit date: 01/04/2018 Discharge date: 01/07/2018  Admission Diagnoses: Lumbar stenosis L1-L2.  History of fusion L2 to sacrum Discharge Diagnoses: Lumbar stenosis L1-L2.  History of fusion L2 to sacrum.  Parkinsonism.  Acute blood loss anemia Active Problems:   Lumbar stenosis with neurogenic claudication   Discharged Condition: good  Hospital Course: Patient was admitted to undergo surgical decompression at L1-L2 having had a previous decompression and fusion from L2 to the sacrum.  She tolerated surgery well was noted to have about a 3 g hemoglobin drop from preop to postop but did not require transfusion.  Her incision is remained clean and dry pain is well controlled.  Consults: None  Significant Diagnostic Studies: None  Treatments: Decompression and fusion L1-L2 revision of fixation from L1-L5  Discharge Exam: Blood pressure 113/65, pulse (!) 54, temperature 97.7 F (36.5 C), temperature source Oral, resp. rate (!) 8, height 5' (1.524 m), weight 64.2 kg, SpO2 100 %. Incision is clean and dry motor function is intact and gait are intact  Disposition: Discharge disposition: 01-Home or Self Care       Discharge Instructions    Call MD for:  redness, tenderness, or signs of infection (pain, swelling, redness, odor or green/yellow discharge around incision site)   Complete by:  As directed    Call MD for:  severe uncontrolled pain   Complete by:  As directed    Call MD for:  temperature >100.4   Complete by:  As directed    Diet - low sodium heart healthy   Complete by:  As directed    Incentive spirometry RT   Complete by:  As directed    Increase activity slowly   Complete by:  As directed      Allergies as of 01/07/2018      Reactions   Compazine [prochlorperazine Edisylate] Anaphylaxis, Swelling   Mouth and tongue swell, throat swells closed      Medication  List    TAKE these medications   acetaminophen 500 MG tablet Commonly known as:  TYLENOL Take 1,000 mg by mouth daily as needed for moderate pain or headache.   albuterol 108 (90 Base) MCG/ACT inhaler Commonly known as:  PROVENTIL HFA;VENTOLIN HFA Inhale 2 puffs into the lungs every 6 (six) hours as needed for wheezing.   carbidopa-levodopa 25-100 MG tablet Commonly known as:  SINEMET IR Take 1 tablet by mouth 3 (three) times daily.   cephALEXin 500 MG capsule Commonly known as:  KEFLEX Take 1 capsule (500 mg total) by mouth 2 (two) times daily for 7 days.   CoQ10 200 MG Caps Take 200 mg by mouth daily.   dexamethasone 1 MG tablet Commonly known as:  DECADRON 2 tablets twice daily for 2 days, one tablet twice daily for 2 days, one tablet daily for 2 days.   HYDROcodone-acetaminophen 5-325 MG tablet Commonly known as:  NORCO/VICODIN Take 1-2 tablets by mouth every 4 (four) hours as needed for moderate pain ((score 4 to 6)).   losartan 100 MG tablet Commonly known as:  COZAAR Take 100 mg by mouth daily.   methocarbamol 500 MG tablet Commonly known as:  ROBAXIN Take 1 tablet (500 mg total) by mouth every 6 (six) hours as needed for muscle spasms.   pravastatin 20 MG tablet Commonly known as:  PRAVACHOL Take 20 mg by mouth daily.   propranolol ER 80 MG 24 hr capsule  Commonly known as:  INDERAL LA TAKE 1 CAPSULE BY MOUTH ONCE DAILY   SYNTHROID 88 MCG tablet Generic drug:  levothyroxine Take 88 mcg by mouth daily.   venlafaxine XR 37.5 MG 24 hr capsule Commonly known as:  EFFEXOR-XR Take 37.5 mg by mouth every evening.   Vitamin D3 5000 units Caps Take 5,000 Units by mouth daily.        SignedEarleen Newport 01/07/2018, 3:46 PM

## 2018-01-07 NOTE — Progress Notes (Signed)
Patient ID: Jeanette [name], female   DOB: 1946-02-02, 72 y.o.   MRN: [id number] Vital signs are stable Patient is doing reasonably well Motor function appears to be doing well Incisions are clean and dry We will discharge today

## 2018-01-10 NOTE — Care Management Important Message (Signed)
Important Message  Patient Details  Name: Jeanette [name] MRN: [id number] Date of Birth: 1945-09-20   Medicare Important Message Given:  Yes    Orbie Pyo 01/10/2018, 8:32 AM

## 2018-01-19 DIAGNOSIS — M5416 Radiculopathy, lumbar region: Secondary | ICD-10-CM | POA: Diagnosis not present

## 2018-01-19 DIAGNOSIS — Z6826 Body mass index (BMI) 26.0-26.9, adult: Secondary | ICD-10-CM | POA: Diagnosis not present

## 2018-01-19 DIAGNOSIS — M48062 Spinal stenosis, lumbar region with neurogenic claudication: Secondary | ICD-10-CM | POA: Diagnosis not present

## 2018-02-10 DIAGNOSIS — E039 Hypothyroidism, unspecified: Secondary | ICD-10-CM | POA: Diagnosis not present

## 2018-02-10 DIAGNOSIS — E782 Mixed hyperlipidemia: Secondary | ICD-10-CM | POA: Diagnosis not present

## 2018-02-10 DIAGNOSIS — I10 Essential (primary) hypertension: Secondary | ICD-10-CM | POA: Diagnosis not present

## 2018-02-17 ENCOUNTER — Encounter: Payer: Medicare Other | Admitting: Gynecology

## 2018-02-21 DIAGNOSIS — E039 Hypothyroidism, unspecified: Secondary | ICD-10-CM | POA: Diagnosis not present

## 2018-02-21 DIAGNOSIS — I10 Essential (primary) hypertension: Secondary | ICD-10-CM | POA: Diagnosis not present

## 2018-02-21 DIAGNOSIS — Z23 Encounter for immunization: Secondary | ICD-10-CM | POA: Diagnosis not present

## 2018-02-21 DIAGNOSIS — E782 Mixed hyperlipidemia: Secondary | ICD-10-CM | POA: Diagnosis not present

## 2018-02-22 ENCOUNTER — Other Ambulatory Visit: Payer: Self-pay | Admitting: Diagnostic Neuroimaging

## 2018-02-24 ENCOUNTER — Ambulatory Visit (INDEPENDENT_AMBULATORY_CARE_PROVIDER_SITE_OTHER): Payer: Medicare Other | Admitting: Gynecology

## 2018-02-24 ENCOUNTER — Encounter: Payer: Self-pay | Admitting: Gynecology

## 2018-02-24 VITALS — BP 120/76 | Ht 59.0 in | Wt 141.0 lb

## 2018-02-24 DIAGNOSIS — M8580 Other specified disorders of bone density and structure, unspecified site: Secondary | ICD-10-CM

## 2018-02-24 DIAGNOSIS — Z01419 Encounter for gynecological examination (general) (routine) without abnormal findings: Secondary | ICD-10-CM

## 2018-02-24 DIAGNOSIS — N952 Postmenopausal atrophic vaginitis: Secondary | ICD-10-CM

## 2018-02-24 NOTE — Progress Notes (Signed)
    Euna Armon Ernster 07-14-1945 [id number]        72 y.o.  [id number] for breast and pelvic exam.  Without gynecologic complaints.  Past medical history,surgical history, problem list, medications, allergies, family history and social history were all reviewed and documented as reviewed in the EPIC chart.  ROS:  Performed with pertinent positives and negatives included in the history, assessment and plan.   Additional significant findings : None   Exam: [name] assistant Vitals:   02/24/18 0952  BP: 120/76  Weight: 141 lb (64 kg)  Height: 4\' 11"  (1.499 m)   Body mass index is 28.48 kg/m.  General appearance:  Normal affect, orientation and appearance. Skin: With a number of seborrheic keratoses covering back and upper chest HEENT: Without gross lesions.  No cervical or supraclavicular adenopathy. Thyroid normal.  Lungs:  Clear without wheezing, rales or rhonchi Cardiac: RR, without RMG Abdominal:  Soft, nontender, without masses, guarding, rebound, organomegaly or hernia Breasts:  Examined lying and sitting without masses, retractions, discharge or axillary adenopathy. Pelvic:  Ext, BUS, Vagina: With atrophic changes  Cervix: With atrophic changes  Uterus: Anteverted, normal size, shape and contour, midline and mobile nontender   Adnexa: Without masses or tenderness    Anus and perineum: Normal   Rectovaginal: Normal sphincter tone without palpated masses or tenderness.    Assessment/Plan:  72 y.o. [id number] female for breast and pelvic exam.   1. Postmenopausal/atrophic genital changes.  No significant menopausal symptoms or any vaginal bleeding. 2. Colonoscopy 2017.  Repeat at their recommended interval. 3. Pap smear 2017.  No Pap smear done today.  No history of abnormal Pap smears.  Current screening guidelines reviewed and we are both comfortable stop screening based on age. 4. Mammography due now and patient will schedule.  Breast exam normal today. 5. DEXA reported  through primary physician's office 2016.  Our last DEXA here 2012 showed osteopenia.  Patient coming due to see her primary physician and have asked her to discuss this with him as to when they recommend following up bone density as they are now doing them through their office per the patient's history. 6. Health maintenance.  No routine lab work done as patient does this elsewhere.  Follow-up 1 year, sooner as needed.  Patient did have a small area on her kidney found on CT scan.  It was followed up the subsequent year.  They had recommended a six-month follow-up.  I have recommended she follow-up with urology for further recommendations as to how to follow this.  We did not discuss this today at her office visit and we will follow-up with phone call to see if she talked to the urologist's.  If not then I would recommend a follow-up CT scan now just to recheck this area.   Anastasio Auerbach MD, 10:17 AM 02/24/2018

## 2018-02-24 NOTE — Patient Instructions (Addendum)
Check with your primary physician about when they want to repeat your bone density.  Office will call you to discuss the CT scan of the kidney that we did not discuss at the office visit.  Follow-up for annual exam in 1 year.

## 2018-02-25 ENCOUNTER — Telehealth: Payer: Self-pay | Admitting: *Deleted

## 2018-02-25 NOTE — Telephone Encounter (Signed)
Left message for patient to call.

## 2018-02-25 NOTE — Telephone Encounter (Signed)
-----   Message from Anastasio Auerbach, MD sent at 02/24/2018 10:24 AM EDT ----- Check with patient.  I forgot to ask her about whether she saw the urologist's in follow-up of the area they saw on the kidney from her CT scan.  If she has seen them and they followed up on this okay.  If she has not seen them, the radiologist recommended a follow-up CT scan to recheck this area on the kidney and I would recommend going ahead and scheduling this.

## 2018-02-25 NOTE — Telephone Encounter (Signed)
Patient said she did follow up with urologist and he told her "everything looked fine" no follow up needed unless she has a problem.

## 2018-03-02 ENCOUNTER — Encounter: Payer: Self-pay | Admitting: Gynecology

## 2018-03-02 DIAGNOSIS — M48062 Spinal stenosis, lumbar region with neurogenic claudication: Secondary | ICD-10-CM | POA: Diagnosis not present

## 2018-03-02 DIAGNOSIS — M5416 Radiculopathy, lumbar region: Secondary | ICD-10-CM | POA: Diagnosis not present

## 2018-03-02 DIAGNOSIS — Z1231 Encounter for screening mammogram for malignant neoplasm of breast: Secondary | ICD-10-CM | POA: Diagnosis not present

## 2018-03-21 ENCOUNTER — Ambulatory Visit (INDEPENDENT_AMBULATORY_CARE_PROVIDER_SITE_OTHER): Payer: Medicare Other | Admitting: Diagnostic Neuroimaging

## 2018-03-21 ENCOUNTER — Encounter: Payer: Self-pay | Admitting: Diagnostic Neuroimaging

## 2018-03-21 VITALS — BP 115/62 | HR 66 | Ht 60.0 in | Wt 140.6 lb

## 2018-03-21 DIAGNOSIS — G20 Parkinson's disease: Secondary | ICD-10-CM

## 2018-03-21 DIAGNOSIS — G250 Essential tremor: Secondary | ICD-10-CM | POA: Diagnosis not present

## 2018-03-21 MED ORDER — CARBIDOPA-LEVODOPA 25-100 MG PO TABS: 1.0000 | ORAL_TABLET | Freq: Three times a day (TID) | ORAL | 12 refills | Status: DC

## 2018-03-21 MED ORDER — PROPRANOLOL HCL ER 80 MG PO CP24: 80.0000 mg | ORAL_CAPSULE | Freq: Every day | ORAL | 4 refills | Status: DC

## 2018-03-21 NOTE — Progress Notes (Signed)
[facility]  PATIENT: Jeanette [name] DOB: Nov 07, 1945  REFERRING CLINICIAN: Ashby Dawes HISTORY FROM: patient  REASON FOR VISIT: follow up    HISTORICAL  CHIEF COMPLAINT:  Chief Complaint  Patient presents with  . Follow-up    9 month follow up. Alone. Rm 7. Patient stated that she feels like she has a tremor to her head but when she looks in the mirror she doesn't see anything x 3-4 months. She stated it doesn't happen every day.     HISTORY OF PRESENT ILLNESS:   UPDATE (03/21/18, VRP): Since last visit, doing more head tremor issues in lasst 3-4 months. Symptoms are progressive and mild. No alleviating or aggravating factors. Tolerating meds.    UPDATE (04/05/17, VRP): Since last visit, doing well. Tolerating meds. No alleviating or aggravating factors. Carb/levo is helping. Patient taking twice a day.   UPDATE 09/29/16: Since last visit, doing well. Carb/levo helping tremor control. Mainly taking twice a day. No other new symptoms.  UPDATE 06/23/16: Since last visit no change in sxs. MRI reviewed. Ready to start carb/levo.   PRIOR HPI (03/11/16): 72 year old female here for evaluation of tremor. Patient is right handed and has hypertension. For past 8-10 years, patient has had intermittent postural and action tremor. This is progressively worsened over time. More recently she has noticed subtle resting tremor. Patient is having more difficulty with holding objects, drinking from a cup, handwriting. Symptoms fluctuate from day to day. She has a glass of wine sometimes this calms down some of her tremor. Patient has been on propranolol for blood pressure control, and was advised to take an extra tablet of this to help with tremor control. However patient has not tried taking the extra propranolol. Patient has significant family history for tremor. Patient's mother had tremor but not specifically diagnosed. Patient's mother also had polycythemia vera and later  leukemia. Patient's son has tremor starting at age 63 years old. Patient's maternal aunts 2 and maternal uncle 1 at Parkinson's disease. Several first cousins on her mother's side also have Parkinson's disease. A different maternal uncle had multiple sclerosis. Patient also has noted some hoarse voice, intermittent choking sensation, mild drooling from the right side of her mouth. No change in smell or taste. No significant anxiety or constipation. No leg tremor. She has some mild balance and walking difficulty.  Patient has had multiple cervical spine surgeries in 2003, 2007 in 2014. 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 She is also had lumbar spine surgeries in 2001 and 2014. Patient had some complication of surgery including hoarse voice.  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 Conventional angiogram was performed which showed tortuous right ICA without evidence of dissection.   REVIEW OF SYSTEMS: Full 14 system review of systems performed and negative with exception of: tremors.   ALLERGIES: Allergies  Allergen Reactions  . Compazine [Prochlorperazine Edisylate] Anaphylaxis and Swelling    Mouth and tongue swell, throat swells closed    HOME MEDICATIONS: Outpatient Medications Prior to Visit  Medication Sig Dispense Refill  . acetaminophen (TYLENOL) 500 MG tablet Take 1,000 mg by mouth daily as needed for moderate pain or headache.    . albuterol (PROVENTIL HFA;VENTOLIN HFA) 108 (90 BASE) MCG/ACT inhaler Inhale 2 puffs into the lungs every 6 (six) hours as needed for wheezing.     . carbidopa-levodopa (SINEMET IR) 25-100 MG tablet TAKE 1 TABLET BY MOUTH  THREE TIMES DAILY BEFORE MEALS 270 tablet 0  . Cholecalciferol (VITAMIN D3) 5000 units CAPS Take 5,000 Units by mouth daily.    . Coenzyme Q10 (COQ10) 200 MG CAPS Take 200 mg by mouth daily.     Marland Kitchen losartan (COZAAR) 100  MG tablet Take 100 mg by mouth daily.    . pravastatin (PRAVACHOL) 20 MG tablet Take 20 mg by mouth daily.     . propranolol ER (INDERAL LA) 80 MG 24 hr capsule TAKE 1 CAPSULE BY MOUTH ONCE DAILY 90 capsule 4  . SYNTHROID 88 MCG tablet Take 88 mcg by mouth daily.    Marland Kitchen venlafaxine XR (EFFEXOR-XR) 37.5 MG 24 hr capsule Take 37.5 mg by mouth every evening.      No facility-administered medications prior to visit.     PAST MEDICAL HISTORY: Past Medical History:  Diagnosis Date  . Arthritis   . Asthma    hx  . Hypertension   . Hypothyroidism   . Neuromuscular disorder (HCC)    tremors in hands  . Pneumonia    hx  . PONV (postoperative nausea and vomiting)    has scop patch to apply  10/13/12  . Stroke [facility]) 2011   tia's  . Thyroid disease   . TIA (transient ischemic attack)     PAST SURGICAL HISTORY: Past Surgical History:  Procedure Laterality Date  . ANTERIOR CERVICAL DECOMP/DISCECTOMY FUSION N/A 10/14/2012   Procedure: ANTERIOR CERVICAL DECOMPRESSION/DISCECTOMY FUSION 1 LEVEL/HARDWARE REMOVAL;  Surgeon: Kristeen Miss, MD;  Location: [facility];  Service: Neurosurgery;  Laterality: N/A;  C7-T1 Anterior cervical decompression/diskectomy/fusion/removal of old synthes plate  . BACK SURGERY  01  . BACK SURGERY  2019  . DILATION AND CURETTAGE OF UTERUS    . HYSTEROSCOPY  2004, 2007   endometrial polyp  . KNEE SURGERY Right    72 yrs old  . NASAL SINUS SURGERY Right 08/2016   polyp removed  . NECK SURGERY  07,03   cerv,Fusion  . ROTATOR CUFF REPAIR Right 2015  . TONSILLECTOMY      FAMILY HISTORY: Family History  Problem Relation Age of Onset  . Leukemia Mother   . Ovarian cancer Mother   . Tremor Mother   . Polycythemia Mother   . Hypertension Father   . Heart disease Father   . Parkinson's disease Maternal Aunt   . Stroke Maternal Aunt   . Parkinson's disease Maternal Uncle   . Multiple sclerosis Maternal Uncle   . Colon cancer Neg Hx     SOCIAL  HISTORY:  Social History   Socioeconomic History  . Marital status: Married    Spouse name: Not on file  . Number of children: 2  . Years of education: 8  . Highest education level: Not on file  Occupational History    Comment: retired Insurance claims handler  Social Needs  . Financial resource strain: Not on file  . Food insecurity:    Worry: Not on file    Inability: Not on file  . Transportation needs:    Medical: Not on file    Non-medical: Not on file  Tobacco Use  . Smoking status: Never Smoker  . Smokeless tobacco: Never Used  Substance and Sexual Activity  . Alcohol use: Yes    Alcohol/week: 1.0 standard drinks    Types: 1 Glasses of wine per week    Comment: wine 1-2 week  . Drug use: No  . Sexual activity: Yes    Birth control/protection: Post-menopausal  Comment: 1st intercourse 60 yo-1 partner  Lifestyle  . Physical activity:    Days per week: Not on file    Minutes per session: Not on file  . Stress: Not on file  Relationships  . Social connections:    Talks on phone: Not on file    Gets together: Not on file    Attends religious service: Not on file    Active member of club or organization: Not on file    Attends meetings of clubs or organizations: Not on file    Relationship status: Not on file  . Intimate partner violence:    Fear of current or ex partner: Not on file    Emotionally abused: Not on file    Physically abused: Not on file    Forced sexual activity: Not on file  Other Topics Concern  . Not on file  Social History Narrative   Lives with husband   Caffeine - coffee/tea, 1-2 cups daily     PHYSICAL EXAM  GENERAL EXAM/CONSTITUTIONAL: Vitals:  Vitals:   03/21/18 1419  BP: 115/62  Pulse: 66  Weight: 140 lb 9.6 oz (63.8 kg)  Height: 5' (1.524 m)   Body mass index is 27.46 kg/m. No exam data present  Patient is in no distress; well developed, nourished and groomed; neck is supple  CARDIOVASCULAR:  Examination of carotid arteries  is normal; no carotid bruits  Regular rate and rhythm, no murmurs  Examination of peripheral vascular system by observation and palpation is normal  EYES:  Ophthalmoscopic exam of optic discs and posterior segments is normal; no papilledema or hemorrhages  MUSCULOSKELETAL:  Gait, strength, tone, movements noted in Neurologic exam below  NEUROLOGIC: MENTAL STATUS:  No flowsheet data found.  awake, alert, oriented to person, place and time  recent and remote memory intact  normal attention and concentration  language fluent, comprehension intact, naming intact,   fund of knowledge appropriate  CRANIAL NERVE:   2nd - no papilledema on fundoscopic exam  2nd, 3rd, 4th, 6th - pupils equal and reactive to light, visual fields full to confrontation, extraocular muscles intact, no nystagmus  5th - facial sensation symmetric  7th - facial strength symmetric  8th - hearing intact  9th - palate elevates symmetrically, uvula midline  11th - shoulder shrug symmetric  12th - tongue protrusion midline  SUBTLE HEAD AND VOICE TREMOR  SLIGHTLY HOARSE VOICE  MOTOR:   normal bulk, full strength in the BUE, BLE  NORMAL TONE IN BUE AND BLE  MILD POSTURAL AND ACTION TREMOR IN BUE  MINIMAL RESTING TREMOR IN RUE > LUE; ESP WITH CONTRALATERAL RAPID ALTERNATING MOVEMENTS  MODERATE BRADYKINESIA IN LUE > RUE  MILD BRADYKINESIA IN BLE   SENSORY:   normal and symmetric to light touch, temperature, vibration  COORDINATION:   finger-nose-finger, fine finger movements normal  REFLEXES:   deep tendon reflexes present and symmetric  TRACE AT ANKLES  GAIT/STATION:   narrow based gait; CAUTIOUS GAIT; SHORT STEPS; DECR ARM SWING     DIAGNOSTIC DATA (LABS, IMAGING, TESTING) - I reviewed patient records, labs, notes, testing and imaging myself where available.  Lab Results  Component Value Date   WBC 15.3 (H) 01/05/2018   HGB 9.4 (L) 01/05/2018   HCT 30.4 (L)  01/05/2018   MCV 95.0 01/05/2018   PLT 288 01/05/2018      Component Value Date/Time   NA 139 01/05/2018 0311   K 4.4 01/05/2018 0311   CL 107 01/05/2018 0311  CO2 27 01/05/2018 0311   GLUCOSE 161 (H) 01/05/2018 0311   BUN 15 01/05/2018 0311   CREATININE 0.85 01/05/2018 0311   CREATININE 1.61 (H) 06/27/2015 1407   CALCIUM 8.4 (L) 01/05/2018 0311   PROT 7.1 12/30/2010 0935   ALBUMIN 3.9 12/30/2010 0935   AST 19 12/30/2010 0935   ALT 14 12/30/2010 0935   ALKPHOS 79 12/30/2010 0935   BILITOT 0.5 12/30/2010 0935   GFRNONAA >60 01/05/2018 0311   GFRAA >60 01/05/2018 0311   No results found for: CHOL, HDL, LDLCALC, LDLDIRECT, TRIG, CHOLHDL No results found for: HGBA1C No results found for: VITAMINB12 No results found for: TSH  10/01/12 MRI cervical spine [I reviewed images myself and agree with interpretation. -VRP]  - Fusion from C3-C7 has a satisfactory appearance.  Mild gliosis of the cord at the C4-5 level. - Progressive degenerative disease at C7-T1.  Advanced facet arthropathy with anterolisthesis of 5 mm.  Disc degeneration and bulging.  Effacement subarachnoid space.  Bilateral neural foraminal stenosis that could compress either or both C8 nerve roots.  This is more pronounced on the right.  02/14/13 MRI lumbar spine [I reviewed images myself and agree with interpretation. -VRP]  1. New broad-based disc protrusion at L1-2 with slight caudal down turning and mild to moderate mass effect on the thecal sac. 2. Stable moderate to moderately severe multifactorial spinal and bilateral lateral recess stenosis at L2-3. 3. Stable posterior and interbody fusion changes at L3-4 and L4-5.  05/28/13 CT neck  1. Focal tapering of the right internal carotid artery with decrease caliber of the distal ICA. 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 An acute dissection is also considered. 2. The vocal cords appear symmetric. An acute vascular event could be affecting function of  the will vocal cords. 3. Extensive cervical spine fusion.  05/28/13 carotid and vertebral angiogram - Angiographically no evidence of intimal flaps, intraluminal filling defects or stenosis to suggest dissection of the carotid artery systems bilaterally. - Mild prominence of the right internal jugular vein in the neck without stenoses. 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  03/20/16 MRI brain [I reviewed images myself and agree with interpretation. There is possible decreased caliber of right ICA on T1 views, but not definitely on T2 views. -VRP]  1.   The brain appears normal for age. 2.    As noted on the MRI from 02/26/2009, there is asymmetry of the ICA at the skull base, with right caliber smaller than the left. This was evaluated on CT angiography 05/28/2013 confirming the reduced caliber.  Consider follow-up evaluation if clinically indicated. 3.    Chronic inflammatory changes involving the right maxillary sinus and adjacent nasopharynx. Although likely due to inflammatory changes, consider dedicated sinus imaging to evaluate to determine if there is erosion of the bones that could imply a more pathologic process. 4.    Degenerative changes at C2-C3 with apparent mild spinal stenosis.  10/13/16 CTA head / neck  1. Negative head and neck CTA aside from arterial tortuosity. Minimal intra- and extracranial atherosclerosis with no stenosis identified. The right ICA is mildly non-dominant due to dominance of the left ACA A1 segment, and there is chronic severe tortuosity of the cervical right ICA. 2.  Normal for age non contrast CT appearance of the brain. 3. Widespread chronic cervical spine fusion from the C3 to the C7 level. Sequelae of C7-T1 ACDF but pseudarthrosis at that level. 4. Chronic right maxillary sinusitis.  ASSESSMENT AND PLAN  72 y.o. year old female here with postural and action tremor in upper extremities for past  8-10 years, now with increasing resting tremor, bradykinesia and rigidity. 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 Also with history of cervical myelopathy and lumbar radiculopathy.  Dx: parkinson's disease + essential tremor + h/o cervical myelopathy + h/o lumbar radiculopathy  1. Parkinsonism, unspecified Parkinsonism type ([place])   2. Essential tremor      PLAN:  PARKINSONISM - continue carbidopa/levodopa for parkinsonism and resting tremor (increase to 1 tab three times a day; may further increase up to 2 tabs three times a day )  ESSENTIAL TREMOR - continue propranolol 80mg  daily for essential tremor and BP control  RIGHT ICA NARROWING / STENOSIS - improved on June 2017 CTA head/neck; monitor for now  Meds ordered this encounter  Medications  . propranolol ER (INDERAL LA) 80 MG 24 hr capsule    Sig: Take 1 capsule (80 mg total) by mouth daily.    Dispense:  90 capsule    Refill:  4    Please consider 90 day supplies to promote better adherence  . carbidopa-levodopa (SINEMET IR) 25-100 MG tablet    Sig: Take 1-2 tablets by mouth 3 (three) times daily.    Dispense:  180 tablet    Refill:  12   Return in about 9 months (around 12/20/2018).    Penni Bombard, MD 14/02/3012, 1:43 PM Certified in Neurology, Neurophysiology and Neuroimaging  [facility] [address], [place], [place] [address] [phone]

## 2018-03-21 NOTE — Patient Instructions (Signed)
  PARKINSONISM - continue carbidopa/levodopa for parkinsonism and resting tremor (increase to 1 tab three times a day; may further increase up to 2 tabs three times a day )  ESSENTIAL TREMOR - continue propranolol 80mg  daily for essential tremor and BP control

## 2018-06-03 DIAGNOSIS — H16223 Keratoconjunctivitis sicca, not specified as Sjogren's, bilateral: Secondary | ICD-10-CM | POA: Diagnosis not present

## 2018-06-14 DIAGNOSIS — H5202 Hypermetropia, left eye: Secondary | ICD-10-CM | POA: Diagnosis not present

## 2018-06-14 DIAGNOSIS — H524 Presbyopia: Secondary | ICD-10-CM | POA: Diagnosis not present

## 2018-06-14 DIAGNOSIS — H04123 Dry eye syndrome of bilateral lacrimal glands: Secondary | ICD-10-CM | POA: Diagnosis not present

## 2018-06-14 DIAGNOSIS — H52223 Regular astigmatism, bilateral: Secondary | ICD-10-CM | POA: Diagnosis not present

## 2018-06-29 ENCOUNTER — Other Ambulatory Visit: Payer: Self-pay | Admitting: Urology

## 2018-06-29 DIAGNOSIS — D4101 Neoplasm of uncertain behavior of right kidney: Secondary | ICD-10-CM

## 2018-07-26 DIAGNOSIS — Z780 Asymptomatic menopausal state: Secondary | ICD-10-CM | POA: Diagnosis not present

## 2018-07-26 DIAGNOSIS — Z0000 Encounter for general adult medical examination without abnormal findings: Secondary | ICD-10-CM | POA: Diagnosis not present

## 2018-07-26 DIAGNOSIS — I10 Essential (primary) hypertension: Secondary | ICD-10-CM | POA: Diagnosis not present

## 2018-07-26 DIAGNOSIS — M810 Age-related osteoporosis without current pathological fracture: Secondary | ICD-10-CM | POA: Diagnosis not present

## 2018-07-26 DIAGNOSIS — E039 Hypothyroidism, unspecified: Secondary | ICD-10-CM | POA: Diagnosis not present

## 2018-07-26 DIAGNOSIS — E782 Mixed hyperlipidemia: Secondary | ICD-10-CM | POA: Diagnosis not present

## 2018-07-28 ENCOUNTER — Other Ambulatory Visit: Payer: Self-pay

## 2018-07-28 ENCOUNTER — Ambulatory Visit (HOSPITAL_COMMUNITY)
Admission: RE | Admit: 2018-07-28 | Discharge: 2018-07-28 | Disposition: A | Discharge status: Home / Self Care | Payer: Medicare Other | Source: Ambulatory Visit | Attending: Urology | Admitting: Urology

## 2018-07-28 DIAGNOSIS — D4101 Neoplasm of uncertain behavior of right kidney: Secondary | ICD-10-CM

## 2018-07-28 DIAGNOSIS — N281 Cyst of kidney, acquired: Secondary | ICD-10-CM | POA: Diagnosis not present

## 2018-07-28 LAB — POCT I-STAT CREATININE: Creatinine, Ser: 0.7 mg/dL (ref 0.44–1.00)

## 2018-07-28 MED ORDER — GADOBUTROL 1 MMOL/ML IV SOLN
6.0000 mL | Freq: Once | INTRAVENOUS | Status: AC | PRN
  Administered 2018-07-28: 6 mL via INTRAVENOUS

## 2018-08-03 ENCOUNTER — Other Ambulatory Visit: Payer: Self-pay | Admitting: Diagnostic Neuroimaging

## 2018-08-04 DIAGNOSIS — J45909 Unspecified asthma, uncomplicated: Secondary | ICD-10-CM | POA: Diagnosis not present

## 2018-08-04 DIAGNOSIS — Z23 Encounter for immunization: Secondary | ICD-10-CM | POA: Diagnosis not present

## 2018-08-04 DIAGNOSIS — E782 Mixed hyperlipidemia: Secondary | ICD-10-CM | POA: Diagnosis not present

## 2018-08-04 DIAGNOSIS — I10 Essential (primary) hypertension: Secondary | ICD-10-CM | POA: Diagnosis not present

## 2018-08-04 DIAGNOSIS — G250 Essential tremor: Secondary | ICD-10-CM | POA: Diagnosis not present

## 2018-08-04 DIAGNOSIS — E039 Hypothyroidism, unspecified: Secondary | ICD-10-CM | POA: Diagnosis not present

## 2018-08-04 DIAGNOSIS — M1000 Idiopathic gout, unspecified site: Secondary | ICD-10-CM | POA: Diagnosis not present

## 2018-08-15 ENCOUNTER — Other Ambulatory Visit: Payer: Self-pay | Admitting: Diagnostic Neuroimaging

## 2018-08-24 ENCOUNTER — Other Ambulatory Visit: Payer: Self-pay | Admitting: Diagnostic Neuroimaging

## 2018-10-06 DIAGNOSIS — S93492A Sprain of other ligament of left ankle, initial encounter: Secondary | ICD-10-CM | POA: Diagnosis not present

## 2018-10-06 DIAGNOSIS — M7582 Other shoulder lesions, left shoulder: Secondary | ICD-10-CM | POA: Diagnosis not present

## 2018-10-20 DIAGNOSIS — L814 Other melanin hyperpigmentation: Secondary | ICD-10-CM | POA: Diagnosis not present

## 2018-10-20 DIAGNOSIS — D2262 Melanocytic nevi of left upper limb, including shoulder: Secondary | ICD-10-CM | POA: Diagnosis not present

## 2018-10-20 DIAGNOSIS — L820 Inflamed seborrheic keratosis: Secondary | ICD-10-CM | POA: Diagnosis not present

## 2018-10-20 DIAGNOSIS — D225 Melanocytic nevi of trunk: Secondary | ICD-10-CM | POA: Diagnosis not present

## 2018-10-20 DIAGNOSIS — H0015 Chalazion left lower eyelid: Secondary | ICD-10-CM | POA: Diagnosis not present

## 2018-10-20 DIAGNOSIS — L821 Other seborrheic keratosis: Secondary | ICD-10-CM | POA: Diagnosis not present

## 2018-10-20 DIAGNOSIS — L918 Other hypertrophic disorders of the skin: Secondary | ICD-10-CM | POA: Diagnosis not present

## 2018-10-20 DIAGNOSIS — D2239 Melanocytic nevi of other parts of face: Secondary | ICD-10-CM | POA: Diagnosis not present

## 2018-10-20 DIAGNOSIS — Z85828 Personal history of other malignant neoplasm of skin: Secondary | ICD-10-CM | POA: Diagnosis not present

## 2018-11-03 DIAGNOSIS — S93492D Sprain of other ligament of left ankle, subsequent encounter: Secondary | ICD-10-CM | POA: Diagnosis not present

## 2018-11-03 DIAGNOSIS — M7582 Other shoulder lesions, left shoulder: Secondary | ICD-10-CM | POA: Diagnosis not present

## 2018-11-24 DIAGNOSIS — S93491A Sprain of other ligament of right ankle, initial encounter: Secondary | ICD-10-CM | POA: Diagnosis not present

## 2018-11-24 DIAGNOSIS — S8002XA Contusion of left knee, initial encounter: Secondary | ICD-10-CM | POA: Diagnosis not present

## 2018-12-20 ENCOUNTER — Encounter: Payer: Self-pay | Admitting: Diagnostic Neuroimaging

## 2018-12-20 ENCOUNTER — Other Ambulatory Visit: Payer: Self-pay

## 2018-12-20 ENCOUNTER — Ambulatory Visit (INDEPENDENT_AMBULATORY_CARE_PROVIDER_SITE_OTHER): Payer: Medicare Other | Admitting: Diagnostic Neuroimaging

## 2018-12-20 VITALS — BP 120/74 | HR 64 | Temp 97.3°F | Ht 60.0 in | Wt 148.0 lb

## 2018-12-20 DIAGNOSIS — G250 Essential tremor: Secondary | ICD-10-CM | POA: Diagnosis not present

## 2018-12-20 DIAGNOSIS — G20 Parkinson's disease: Secondary | ICD-10-CM | POA: Diagnosis not present

## 2018-12-20 MED ORDER — CARBIDOPA-LEVODOPA 25-100 MG PO TABS: 1.0000 | ORAL_TABLET | Freq: Three times a day (TID) | ORAL | 12 refills | Status: DC

## 2018-12-20 MED ORDER — PROPRANOLOL HCL ER 80 MG PO CP24: 80.0000 mg | ORAL_CAPSULE | Freq: Every day | ORAL | 4 refills | Status: DC

## 2018-12-20 NOTE — Progress Notes (Signed)
[facility]  PATIENT: Jeanette [name] DOB: 05-Aug-1945  REFERRING CLINICIAN: Ashby Dawes HISTORY FROM: patient  REASON FOR VISIT: follow up    HISTORICAL  CHIEF COMPLAINT:  Chief Complaint  Patient presents with  . Parkinsonism    rm 7 FU, "no new concerns, some days my hands shake badly"    HISTORY OF PRESENT ILLNESS:   UPDATE (12/20/18, VRP): Since last visit, doing well. Symptoms are stable. Severity is mild. No alleviating or aggravating factors. Tolerating meds. Some afternoon tremors are noticeable. Has had some falls.   UPDATE (03/21/18, VRP): Since last visit, doing more head tremor issues in last 3-4 months. Symptoms are progressive and mild. No alleviating or aggravating factors. Tolerating meds.    UPDATE (04/05/17, VRP): Since last visit, doing well. Tolerating meds. No alleviating or aggravating factors. Carb/levo is helping. Patient taking twice a day.   UPDATE 09/29/16: Since last visit, doing well. Carb/levo helping tremor control. Mainly taking twice a day. No other new symptoms.  UPDATE 06/23/16: Since last visit no change in sxs. MRI reviewed. Ready to start carb/levo.   PRIOR HPI (03/11/16): 73 year old female here for evaluation of tremor. Patient is right handed and has hypertension. For past 8-10 years, patient has had intermittent postural and action tremor. This is progressively worsened over time. More recently she has noticed subtle resting tremor. Patient is having more difficulty with holding objects, drinking from a cup, handwriting. Symptoms fluctuate from day to day. She has a glass of wine sometimes this calms down some of her tremor. Patient has been on propranolol for blood pressure control, and was advised to take an extra tablet of this to help with tremor control. However patient has not tried taking the extra propranolol. Patient has significant family history for tremor. Patient's mother had tremor but not specifically diagnosed.  Patient's mother also had polycythemia vera and later leukemia. Patient's son has tremor starting at age 97 years old. Patient's maternal aunts 2 and maternal uncle 1 at Parkinson's disease. Several first cousins on her mother's side also have Parkinson's disease. A different maternal uncle had multiple sclerosis. Patient also has noted some hoarse voice, intermittent choking sensation, mild drooling from the right side of her mouth. No change in smell or taste. No significant anxiety or constipation. No leg tremor. She has some mild balance and walking difficulty.  Patient has had multiple cervical spine surgeries in 2003, 2007 in 2014. 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 She is also had lumbar spine surgeries in 2001 and 2014. Patient had some complication of surgery including hoarse voice.  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 Conventional angiogram was performed which showed tortuous right ICA without evidence of dissection.   REVIEW OF SYSTEMS: Full 14 system review of systems performed and negative with exception of: as per HPI.   ALLERGIES: Allergies  Allergen Reactions  . Compazine [Prochlorperazine Edisylate] Anaphylaxis and Swelling    Mouth and tongue swell, throat swells closed    HOME MEDICATIONS: Outpatient Medications Prior to Visit  Medication Sig Dispense Refill  . acetaminophen (TYLENOL) 500 MG tablet Take 1,000 mg by mouth daily as needed for moderate pain or headache.    . albuterol (PROVENTIL HFA;VENTOLIN HFA) 108 (90 BASE) MCG/ACT inhaler Inhale 2 puffs into the lungs every 6 (six) hours as needed for wheezing.     . carbidopa-levodopa (SINEMET IR) 25-100 MG tablet Take  1-2 tablets by mouth 3 (three) times daily. 180 tablet 12  . Cholecalciferol (VITAMIN D3) 5000 units CAPS Take 5,000 Units by mouth daily.    Marland Kitchen losartan (COZAAR) 100 MG tablet Take  100 mg by mouth daily.    . propranolol ER (INDERAL LA) 80 MG 24 hr capsule Take 1 capsule (80 mg total) by mouth daily. 90 capsule 4  . SYNTHROID 88 MCG tablet Take 88 mcg by mouth daily.    Marland Kitchen venlafaxine XR (EFFEXOR-XR) 37.5 MG 24 hr capsule Take 37.5 mg by mouth every evening.     . pravastatin (PRAVACHOL) 20 MG tablet Take 20 mg by mouth daily.     . Coenzyme Q10 (COQ10) 200 MG CAPS Take 200 mg by mouth daily.      No facility-administered medications prior to visit.     PAST MEDICAL HISTORY: Past Medical History:  Diagnosis Date  . Arthritis   . Asthma    hx  . Hypertension   . Hypothyroidism   . Neuromuscular disorder (HCC)    tremors in hands  . Pneumonia    hx  . PONV (postoperative nausea and vomiting)    has scop patch to apply  10/13/12  . Stroke [facility]) 2011   tia's  . Thyroid disease   . TIA (transient ischemic attack)     PAST SURGICAL HISTORY: Past Surgical History:  Procedure Laterality Date  . ANTERIOR CERVICAL DECOMP/DISCECTOMY FUSION N/A 10/14/2012   Procedure: ANTERIOR CERVICAL DECOMPRESSION/DISCECTOMY FUSION 1 LEVEL/HARDWARE REMOVAL;  Surgeon: Kristeen Miss, MD;  Location: [facility];  Service: Neurosurgery;  Laterality: N/A;  C7-T1 Anterior cervical decompression/diskectomy/fusion/removal of old synthes plate  . BACK SURGERY  01  . BACK SURGERY  2019  . DILATION AND CURETTAGE OF UTERUS    . HYSTEROSCOPY  2004, 2007   endometrial polyp  . KNEE SURGERY Right    73 yrs old  . NASAL SINUS SURGERY Right 08/2016   polyp removed  . NECK SURGERY  07,03   cerv,Fusion  . ROTATOR CUFF REPAIR Right 2015  . TONSILLECTOMY      FAMILY HISTORY: Family History  Problem Relation Age of Onset  . Leukemia Mother   . Ovarian cancer Mother   . Tremor Mother   . Polycythemia Mother   . Hypertension Father   . Heart disease Father   . Parkinson's disease Maternal Aunt   . Stroke Maternal Aunt   . Parkinson's disease Maternal Uncle   . Multiple sclerosis Maternal  Uncle   . Colon cancer Neg Hx     SOCIAL HISTORY:  Social History   Socioeconomic History  . Marital status: Married    Spouse name: Not on file  . Number of children: 2  . Years of education: 59  . Highest education level: Not on file  Occupational History    Comment: retired Insurance claims handler  Social Needs  . Financial resource strain: Not on file  . Food insecurity    Worry: Not on file    Inability: Not on file  . Transportation needs    Medical: Not on file    Non-medical: Not on file  Tobacco Use  . Smoking status: Never Smoker  . Smokeless tobacco: Never Used  Substance and Sexual Activity  . Alcohol use: Yes    Alcohol/week: 1.0 standard drinks    Types: 1 Glasses of wine per week    Comment: wine 1-2 week  . Drug use: No  . Sexual activity: Yes  Birth control/protection: Post-menopausal    Comment: 1st intercourse 68 yo-1 partner  Lifestyle  . Physical activity    Days per week: Not on file    Minutes per session: Not on file  . Stress: Not on file  Relationships  . Social Herbalist on phone: Not on file    Gets together: Not on file    Attends religious service: Not on file    Active member of club or organization: Not on file    Attends meetings of clubs or organizations: Not on file    Relationship status: Not on file  . Intimate partner violence    Fear of current or ex partner: Not on file    Emotionally abused: Not on file    Physically abused: Not on file    Forced sexual activity: Not on file  Other Topics Concern  . Not on file  Social History Narrative   Lives with husband   Caffeine - coffee/tea, 1-2 cups daily     PHYSICAL EXAM  GENERAL EXAM/CONSTITUTIONAL: Vitals:  Vitals:   12/20/18 1113  BP: 120/74  Pulse: 64  Temp: (!) 97.3 F (36.3 C)  Weight: 148 lb (67.1 kg)  Height: 5' (1.524 m)   Body mass index is 28.9 kg/m. No exam data present  Patient is in no distress; well developed, nourished and groomed; neck is  supple  CARDIOVASCULAR:  Examination of carotid arteries is normal; no carotid bruits  Regular rate and rhythm, no murmurs  Examination of peripheral vascular system by observation and palpation is normal  EYES:  Ophthalmoscopic exam of optic discs and posterior segments is normal; no papilledema or hemorrhages  MUSCULOSKELETAL:  Gait, strength, tone, movements noted in Neurologic exam below  NEUROLOGIC: MENTAL STATUS:  No flowsheet data found.  awake, alert, oriented to person, place and time  recent and remote memory intact  normal attention and concentration  language fluent, comprehension intact, naming intact,   fund of knowledge appropriate  CRANIAL NERVE:   2nd - no papilledema on fundoscopic exam  2nd, 3rd, 4th, 6th - pupils equal and reactive to light, visual fields full to confrontation, extraocular muscles intact, no nystagmus  5th - facial sensation symmetric  7th - facial strength symmetric  8th - hearing intact  9th - palate elevates symmetrically, uvula midline  11th - shoulder shrug symmetric  12th - tongue protrusion midline  SUBTLE VOICE TREMOR  SLIGHTLY HOARSE VOICE  MOTOR:   normal bulk, full strength in the BUE, BLE  NORMAL TONE IN BUE AND BLE  MILD POSTURAL AND ACTION TREMOR IN BUE  RARE RESTING TREMOR BUE AND BLE  MILD BRADYKINESIA IN LUE AND LLE   SENSORY:   normal and symmetric to light touch, temperature, vibration  COORDINATION:   finger-nose-finger, fine finger movements normal  REFLEXES:   deep tendon reflexes present and symmetric  TRACE AT ANKLES  GAIT/STATION:   narrow based gait; CAUTIOUS GAIT; SHORT STEPS; DECR ARM SWING     DIAGNOSTIC DATA (LABS, IMAGING, TESTING) - I reviewed patient records, labs, notes, testing and imaging myself where available.  Lab Results  Component Value Date   WBC 15.3 (H) 01/05/2018   HGB 9.4 (L) 01/05/2018   HCT 30.4 (L) 01/05/2018   MCV 95.0 01/05/2018   PLT  288 01/05/2018      Component Value Date/Time   NA 139 01/05/2018 0311   K 4.4 01/05/2018 0311   CL 107 01/05/2018 0311   CO2  27 01/05/2018 0311   GLUCOSE 161 (H) 01/05/2018 0311   BUN 15 01/05/2018 0311   CREATININE 0.70 07/28/2018 1014   CREATININE 1.61 (H) 06/27/2015 1407   CALCIUM 8.4 (L) 01/05/2018 0311   PROT 7.1 12/30/2010 0935   ALBUMIN 3.9 12/30/2010 0935   AST 19 12/30/2010 0935   ALT 14 12/30/2010 0935   ALKPHOS 79 12/30/2010 0935   BILITOT 0.5 12/30/2010 0935   GFRNONAA >60 01/05/2018 0311   GFRAA >60 01/05/2018 0311   No results found for: CHOL, HDL, LDLCALC, LDLDIRECT, TRIG, CHOLHDL No results found for: HGBA1C No results found for: VITAMINB12 No results found for: TSH  10/01/12 MRI cervical spine [I reviewed images myself and agree with interpretation. -VRP]  - Fusion from C3-C7 has a satisfactory appearance.  Mild gliosis of the cord at the C4-5 level. - Progressive degenerative disease at C7-T1.  Advanced facet arthropathy with anterolisthesis of 5 mm.  Disc degeneration and bulging.  Effacement subarachnoid space.  Bilateral neural foraminal stenosis that could compress either or both C8 nerve roots.  This is more pronounced on the right.  01/28/2013 MRI lumbar spine [I reviewed images myself and agree with interpretation. -VRP]  1. New broad-based disc protrusion at L1-2 with slight caudal down turning and mild to moderate mass effect on the thecal sac. 2. Stable moderate to moderately severe multifactorial spinal and bilateral lateral recess stenosis at L2-3. 3. Stable posterior and interbody fusion changes at L3-4 and L4-5.  05/28/13 CT neck  1. Focal tapering of the right internal carotid artery with decrease caliber of the distal ICA. 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 An acute dissection is also considered. 2. The vocal cords appear symmetric. An acute vascular event could be affecting function of the will vocal cords. 3. Extensive  cervical spine fusion.  05/28/13 carotid and vertebral angiogram - Angiographically no evidence of intimal flaps, intraluminal filling defects or stenosis to suggest dissection of the carotid artery systems bilaterally. - Mild prominence of the right internal jugular vein in the neck without stenoses. 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  03/20/16 MRI brain [I reviewed images myself and agree with interpretation. There is possible decreased caliber of right ICA on T1 views, but not definitely on T2 views. -VRP]  1.   The brain appears normal for age. 2.    As noted on the MRI from 02/26/2009, there is asymmetry of the ICA at the skull base, with right caliber smaller than the left. This was evaluated on CT angiography 05/28/2013 confirming the reduced caliber.  Consider follow-up evaluation if clinically indicated. 3.    Chronic inflammatory changes involving the right maxillary sinus and adjacent nasopharynx. Although likely due to inflammatory changes, consider dedicated sinus imaging to evaluate to determine if there is erosion of the bones that could imply a more pathologic process. 4.    Degenerative changes at C2-C3 with apparent mild spinal stenosis.  10/13/16 CTA head / neck  1. Negative head and neck CTA aside from arterial tortuosity. Minimal intra- and extracranial atherosclerosis with no stenosis identified. The right ICA is mildly non-dominant due to dominance of the left ACA A1 segment, and there is chronic severe tortuosity of the cervical right ICA. 2.  Normal for age non contrast CT appearance of the brain. 3. Widespread chronic cervical spine fusion from the C3 to the C7 level. Sequelae of C7-T1 ACDF but pseudarthrosis at that level. 4. Chronic right maxillary sinusitis.  ASSESSMENT AND PLAN  73 y.o. year old female here with postural and action tremor in upper extremities for past 8-10 years, now with increasing resting  tremor, bradykinesia and rigidity. 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 Also with history of cervical myelopathy and lumbar radiculopathy.  Dx: parkinson's disease + essential tremor + h/o cervical myelopathy + h/o lumbar radiculopathy  No diagnosis found.   PLAN:  PARKINSONISM - continue carbidopa/levodopa for parkinsonism and resting tremor (up to 1 tab three times a day; may further increase up to 2 tabs three times a day ) - resume PT exercises at home for balance and gait; use cane or walker as needed  ESSENTIAL TREMOR - continue propranolol 80mg  daily for essential tremor and BP control  RIGHT ICA NARROWING / STENOSIS - improved on June 2017 CTA head/neck; monitor for now  Meds ordered this encounter  Medications  . propranolol ER (INDERAL LA) 80 MG 24 hr capsule    Sig: Take 1 capsule (80 mg total) by mouth daily.    Dispense:  90 capsule    Refill:  4  . carbidopa-levodopa (SINEMET IR) 25-100 MG tablet    Sig: Take 1-2 tablets by mouth 3 (three) times daily.    Dispense:  180 tablet    Refill:  12   Return in about 1 year (around 12/20/2019).    Penni Bombard, MD 2/50/0370, 48:88 AM Certified in Neurology, Neurophysiology and Neuroimaging  [facility] [address], [place], [place] [address] [phone]

## 2019-01-17 DIAGNOSIS — Z23 Encounter for immunization: Secondary | ICD-10-CM | POA: Diagnosis not present

## 2019-02-14 DIAGNOSIS — D485 Neoplasm of uncertain behavior of skin: Secondary | ICD-10-CM | POA: Diagnosis not present

## 2019-02-14 DIAGNOSIS — Z85828 Personal history of other malignant neoplasm of skin: Secondary | ICD-10-CM | POA: Diagnosis not present

## 2019-02-14 DIAGNOSIS — C44722 Squamous cell carcinoma of skin of right lower limb, including hip: Secondary | ICD-10-CM | POA: Diagnosis not present

## 2019-02-14 DIAGNOSIS — L821 Other seborrheic keratosis: Secondary | ICD-10-CM | POA: Diagnosis not present

## 2019-02-16 ENCOUNTER — Encounter: Payer: Self-pay | Admitting: Gynecology

## 2019-02-21 DIAGNOSIS — C44722 Squamous cell carcinoma of skin of right lower limb, including hip: Secondary | ICD-10-CM | POA: Diagnosis not present

## 2019-02-23 ENCOUNTER — Encounter: Payer: Self-pay | Admitting: Gynecology

## 2019-02-23 DIAGNOSIS — Z1231 Encounter for screening mammogram for malignant neoplasm of breast: Secondary | ICD-10-CM | POA: Diagnosis not present

## 2019-03-02 DIAGNOSIS — I10 Essential (primary) hypertension: Secondary | ICD-10-CM | POA: Diagnosis not present

## 2019-03-02 DIAGNOSIS — E782 Mixed hyperlipidemia: Secondary | ICD-10-CM | POA: Diagnosis not present

## 2019-03-02 DIAGNOSIS — E039 Hypothyroidism, unspecified: Secondary | ICD-10-CM | POA: Diagnosis not present

## 2019-03-07 DIAGNOSIS — M722 Plantar fascial fibromatosis: Secondary | ICD-10-CM | POA: Diagnosis not present

## 2019-03-10 ENCOUNTER — Encounter: Payer: Medicare Other | Admitting: Gynecology

## 2019-03-16 DIAGNOSIS — M1000 Idiopathic gout, unspecified site: Secondary | ICD-10-CM | POA: Diagnosis not present

## 2019-03-16 DIAGNOSIS — G250 Essential tremor: Secondary | ICD-10-CM | POA: Diagnosis not present

## 2019-03-16 DIAGNOSIS — Z7189 Other specified counseling: Secondary | ICD-10-CM | POA: Diagnosis not present

## 2019-03-16 DIAGNOSIS — I10 Essential (primary) hypertension: Secondary | ICD-10-CM | POA: Diagnosis not present

## 2019-03-16 DIAGNOSIS — J4520 Mild intermittent asthma, uncomplicated: Secondary | ICD-10-CM | POA: Diagnosis not present

## 2019-03-16 DIAGNOSIS — E782 Mixed hyperlipidemia: Secondary | ICD-10-CM | POA: Diagnosis not present

## 2019-03-16 DIAGNOSIS — E039 Hypothyroidism, unspecified: Secondary | ICD-10-CM | POA: Diagnosis not present

## 2019-03-16 DIAGNOSIS — J45909 Unspecified asthma, uncomplicated: Secondary | ICD-10-CM | POA: Diagnosis not present

## 2019-04-11 ENCOUNTER — Other Ambulatory Visit: Payer: Self-pay

## 2019-04-12 ENCOUNTER — Encounter: Payer: Self-pay | Admitting: Gynecology

## 2019-04-12 ENCOUNTER — Ambulatory Visit (INDEPENDENT_AMBULATORY_CARE_PROVIDER_SITE_OTHER): Payer: Medicare Other | Admitting: Gynecology

## 2019-04-12 VITALS — BP 124/78 | Ht 59.0 in | Wt 146.0 lb

## 2019-04-12 DIAGNOSIS — Z01419 Encounter for gynecological examination (general) (routine) without abnormal findings: Secondary | ICD-10-CM

## 2019-04-12 DIAGNOSIS — M8580 Other specified disorders of bone density and structure, unspecified site: Secondary | ICD-10-CM

## 2019-04-12 DIAGNOSIS — N952 Postmenopausal atrophic vaginitis: Secondary | ICD-10-CM

## 2019-04-12 NOTE — Patient Instructions (Signed)
Follow-up in 1 year for annual exam, sooner as needed. 

## 2019-04-12 NOTE — Progress Notes (Signed)
    Gazella Sterman Katen 1946-01-10 [id number]        73 y.o.  [id number] for breast and pelvic exam.  Without gynecologic complaints  Past medical history,surgical history, problem list, medications, allergies, family history and social history were all reviewed and documented as reviewed in the EPIC chart.  ROS:  Performed with pertinent positives and negatives included in the history, assessment and plan.   Additional significant findings : None   Exam: [name] assistant Vitals:   04/12/19 1422  BP: 124/78  Weight: 146 lb (66.2 kg)  Height: 4\' 11"  (1.499 m)   Body mass index is 29.49 kg/m.  General appearance:  Normal affect, orientation and appearance. Skin: Grossly normal HEENT: Without gross lesions.  No cervical or supraclavicular adenopathy. Thyroid normal.  Lungs:  Clear without wheezing, rales or rhonchi Cardiac: RR, without RMG Abdominal:  Soft, nontender, without masses, guarding, rebound, organomegaly or hernia Breasts:  Examined lying and sitting without masses, retractions, discharge or axillary adenopathy. Pelvic:  Ext, BUS, Vagina: With atrophic changes  Cervix: With atrophic changes  Uterus: Anteverted, normal size, shape and contour, midline and mobile nontender   Adnexa: Without masses or tenderness    Anus and perineum: Normal   Rectovaginal: Normal sphincter tone without palpated masses or tenderness.    Assessment/Plan:  73 y.o. [id number] female for breast and pelvic exam  1. Postmenopausal.  No significant menopausal symptoms or any vaginal bleeding. 2. Pap smear 2017.  No Pap smear done today.  No history of abnormal Pap smears.  We both agree to stop screening per current screening guidelines. 3. Colonoscopy 2017.  Repeat at their recommended interval. 4. DEXA 2020 reported with osteopenia.  We will continue to follow-up with her primary physician for bone management. 5. Mammography 02/2019.  Continue with annual mammography when due.  Breast exam normal  today. 6. Health maintenance.  No routine lab work done as patient does this elsewhere.  Had follow-up MRI of her kidneys to clear a renal cyst previously noted.  They recommended no special follow-up.  She will follow-up next year for annual exam, sooner if any issues.   Anastasio Auerbach MD, 2:54 PM 04/12/2019

## 2019-04-21 ENCOUNTER — Other Ambulatory Visit: Payer: Self-pay | Admitting: Diagnostic Neuroimaging

## 2019-06-06 DIAGNOSIS — R1031 Right lower quadrant pain: Secondary | ICD-10-CM | POA: Diagnosis not present

## 2019-06-08 DIAGNOSIS — R1084 Generalized abdominal pain: Secondary | ICD-10-CM | POA: Diagnosis not present

## 2019-06-08 DIAGNOSIS — D3001 Benign neoplasm of right kidney: Secondary | ICD-10-CM | POA: Diagnosis not present

## 2019-06-27 DIAGNOSIS — M25551 Pain in right hip: Secondary | ICD-10-CM | POA: Diagnosis not present

## 2019-06-27 DIAGNOSIS — M545 Low back pain: Secondary | ICD-10-CM | POA: Diagnosis not present

## 2019-07-04 DIAGNOSIS — S76011D Strain of muscle, fascia and tendon of right hip, subsequent encounter: Secondary | ICD-10-CM | POA: Diagnosis not present

## 2019-07-04 DIAGNOSIS — M25551 Pain in right hip: Secondary | ICD-10-CM | POA: Diagnosis not present

## 2019-07-10 DIAGNOSIS — S76011D Strain of muscle, fascia and tendon of right hip, subsequent encounter: Secondary | ICD-10-CM | POA: Diagnosis not present

## 2019-07-10 DIAGNOSIS — M25551 Pain in right hip: Secondary | ICD-10-CM | POA: Diagnosis not present

## 2019-07-12 DIAGNOSIS — S76011D Strain of muscle, fascia and tendon of right hip, subsequent encounter: Secondary | ICD-10-CM | POA: Diagnosis not present

## 2019-07-12 DIAGNOSIS — M25551 Pain in right hip: Secondary | ICD-10-CM | POA: Diagnosis not present

## 2019-07-18 DIAGNOSIS — M25551 Pain in right hip: Secondary | ICD-10-CM | POA: Diagnosis not present

## 2019-07-18 DIAGNOSIS — S76011D Strain of muscle, fascia and tendon of right hip, subsequent encounter: Secondary | ICD-10-CM | POA: Diagnosis not present

## 2019-07-20 DIAGNOSIS — S76011D Strain of muscle, fascia and tendon of right hip, subsequent encounter: Secondary | ICD-10-CM | POA: Diagnosis not present

## 2019-07-20 DIAGNOSIS — M25551 Pain in right hip: Secondary | ICD-10-CM | POA: Diagnosis not present

## 2019-08-03 DIAGNOSIS — E039 Hypothyroidism, unspecified: Secondary | ICD-10-CM | POA: Diagnosis not present

## 2019-08-03 DIAGNOSIS — Z0000 Encounter for general adult medical examination without abnormal findings: Secondary | ICD-10-CM | POA: Diagnosis not present

## 2019-08-03 DIAGNOSIS — Z79899 Other long term (current) drug therapy: Secondary | ICD-10-CM | POA: Diagnosis not present

## 2019-08-03 DIAGNOSIS — I10 Essential (primary) hypertension: Secondary | ICD-10-CM | POA: Diagnosis not present

## 2019-08-03 DIAGNOSIS — J4520 Mild intermittent asthma, uncomplicated: Secondary | ICD-10-CM | POA: Diagnosis not present

## 2019-08-03 DIAGNOSIS — E782 Mixed hyperlipidemia: Secondary | ICD-10-CM | POA: Diagnosis not present

## 2019-08-08 DIAGNOSIS — H04552 Acquired stenosis of left nasolacrimal duct: Secondary | ICD-10-CM | POA: Diagnosis not present

## 2019-08-10 DIAGNOSIS — I10 Essential (primary) hypertension: Secondary | ICD-10-CM | POA: Diagnosis not present

## 2019-08-10 DIAGNOSIS — M1000 Idiopathic gout, unspecified site: Secondary | ICD-10-CM | POA: Diagnosis not present

## 2019-08-10 DIAGNOSIS — J45909 Unspecified asthma, uncomplicated: Secondary | ICD-10-CM | POA: Diagnosis not present

## 2019-08-10 DIAGNOSIS — E039 Hypothyroidism, unspecified: Secondary | ICD-10-CM | POA: Diagnosis not present

## 2019-08-10 DIAGNOSIS — E782 Mixed hyperlipidemia: Secondary | ICD-10-CM | POA: Diagnosis not present

## 2019-08-10 DIAGNOSIS — G250 Essential tremor: Secondary | ICD-10-CM | POA: Diagnosis not present

## 2019-08-10 DIAGNOSIS — J4520 Mild intermittent asthma, uncomplicated: Secondary | ICD-10-CM | POA: Diagnosis not present

## 2019-08-17 DIAGNOSIS — H16212 Exposure keratoconjunctivitis, left eye: Secondary | ICD-10-CM | POA: Diagnosis not present

## 2019-08-17 DIAGNOSIS — H02135 Senile ectropion of left lower eyelid: Secondary | ICD-10-CM | POA: Diagnosis not present

## 2019-08-17 DIAGNOSIS — H04562 Stenosis of left lacrimal punctum: Secondary | ICD-10-CM | POA: Diagnosis not present

## 2019-08-17 DIAGNOSIS — H04222 Epiphora due to insufficient drainage, left lacrimal gland: Secondary | ICD-10-CM | POA: Diagnosis not present

## 2019-09-11 DIAGNOSIS — R21 Rash and other nonspecific skin eruption: Secondary | ICD-10-CM | POA: Diagnosis not present

## 2019-09-19 IMAGING — DX DG CHEST 2V
2 series · 2 of 2 positions shown · non-contrast
Comparison: 10/12/2012

CLINICAL DATA: Fever

EXAM:
CHEST - 2 VIEW

[w chest pa]
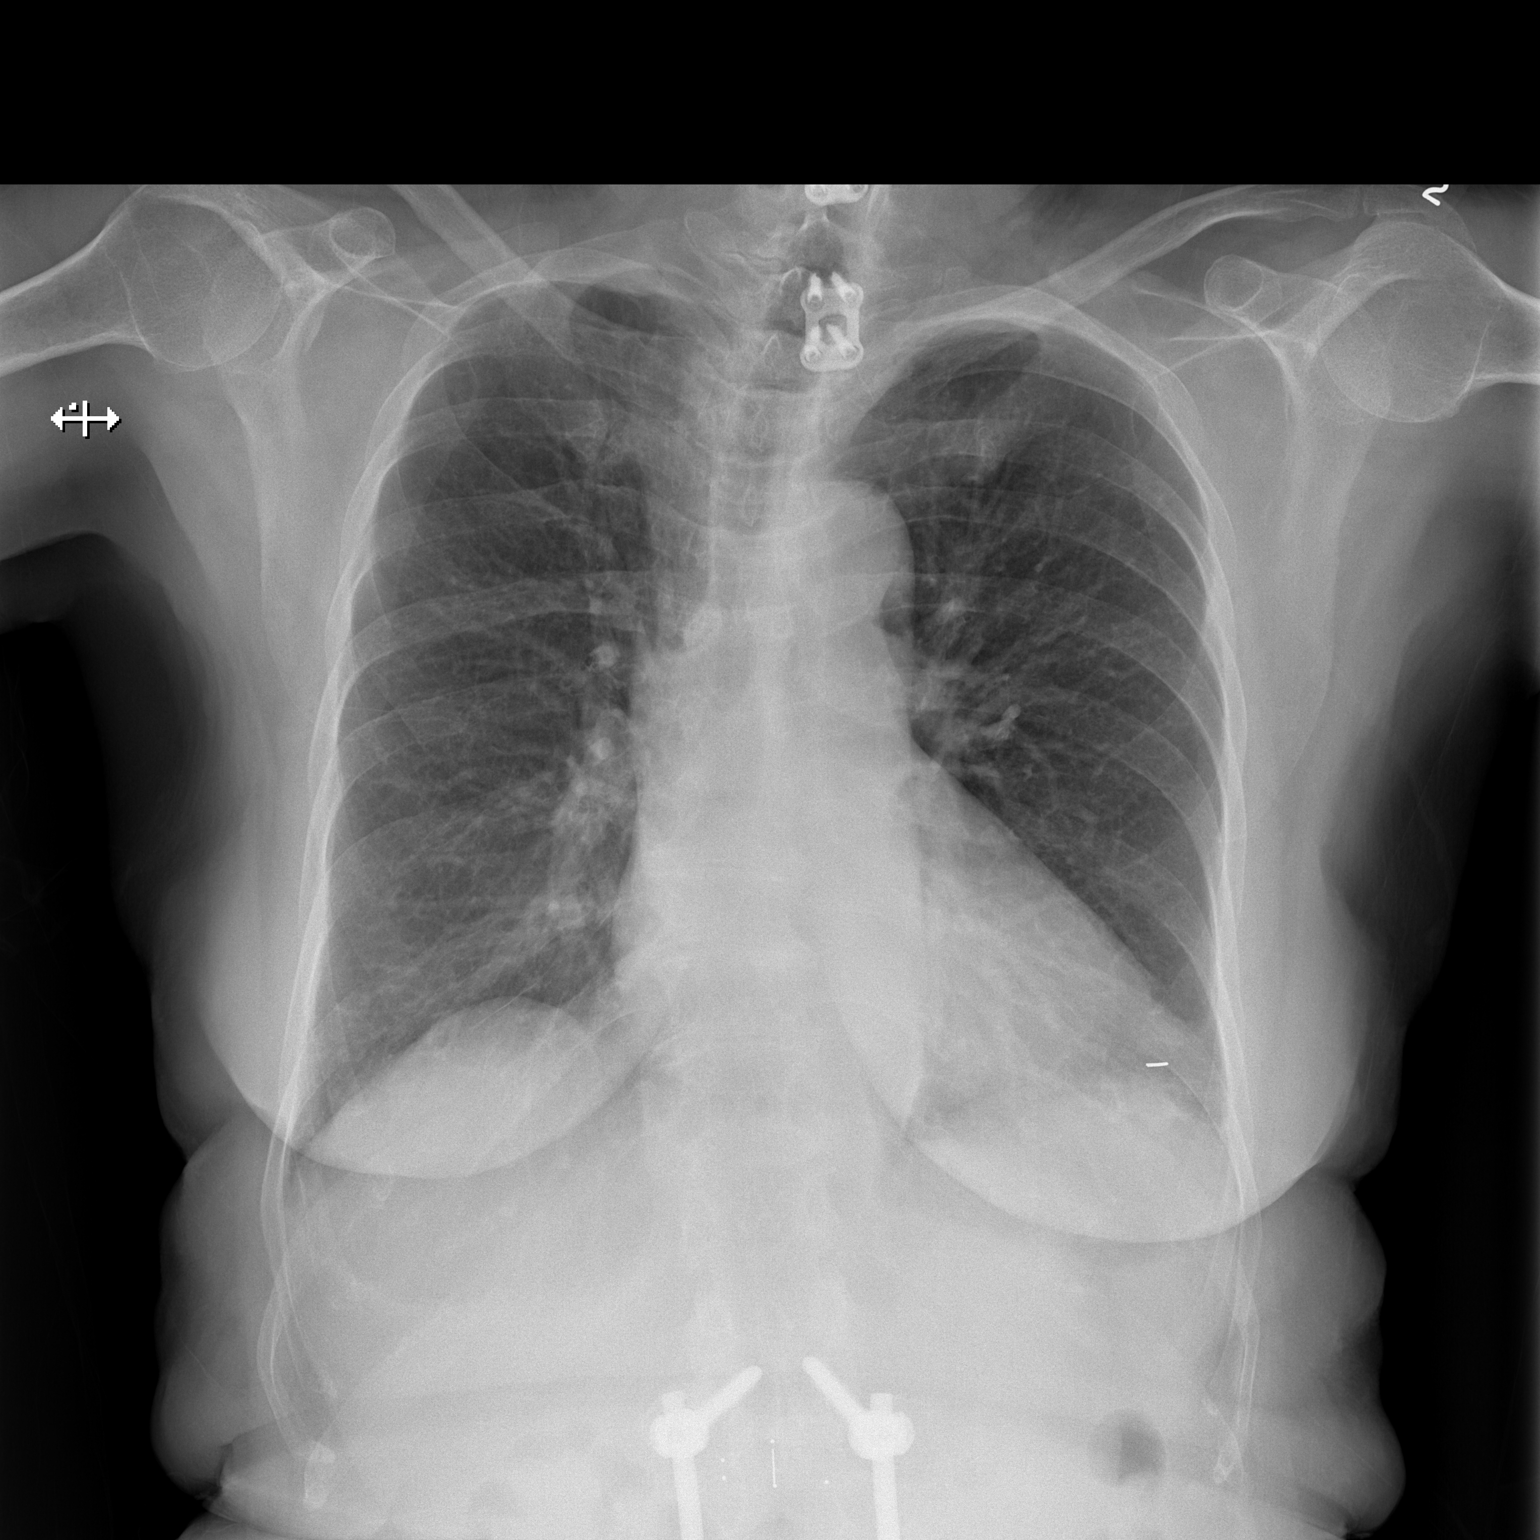

[w chest lat]
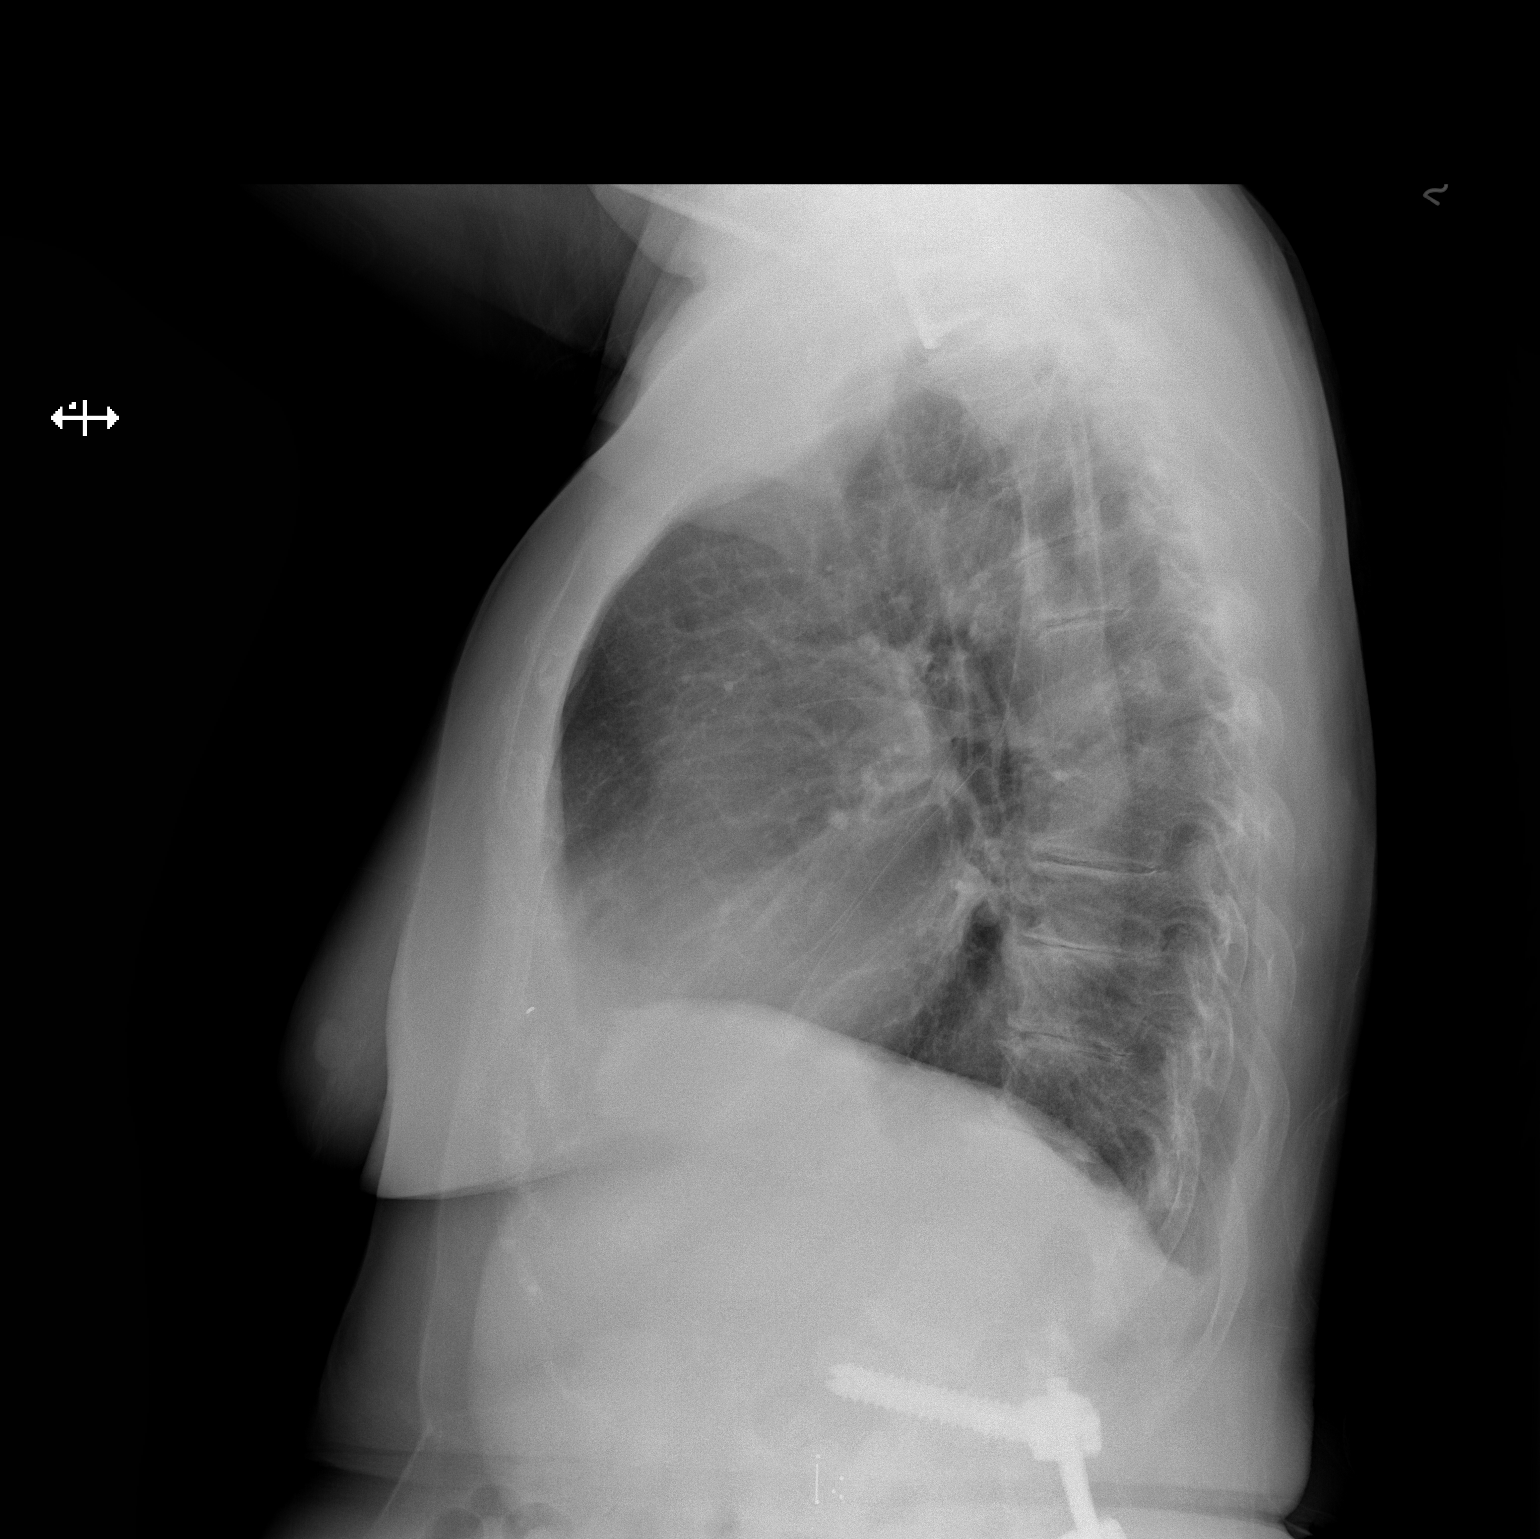

[2 of 2 positions shown; findings below may reference images not displayed]

FINDINGS: Postsurgical changes within the cervicothoracic spine. Metallic
opacity in the left breast. Mild bronchitic changes at the bases. No
acute consolidation or effusion. Mild cardiomegaly. No pneumothorax.
Surgical hardware in the spine.
IMPRESSION: No active cardiopulmonary disease.  Mild cardiomegaly.

## 2019-09-23 IMAGING — RF DG C-ARM 61-120 MIN
1 series · 2 of 2 positions shown · non-contrast
Comparison: Lumbar spine radiographs September 09, 2017

CLINICAL DATA: L1-2 PLIF.

EXAM:
DG C-ARM 61-120 MIN; LUMBAR SPINE - 2-3 VIEW
FLUOROSCOPY TIME:  24 seconds.

[Series 1: run · 2 of 2 slices shown]
[im 1/2]
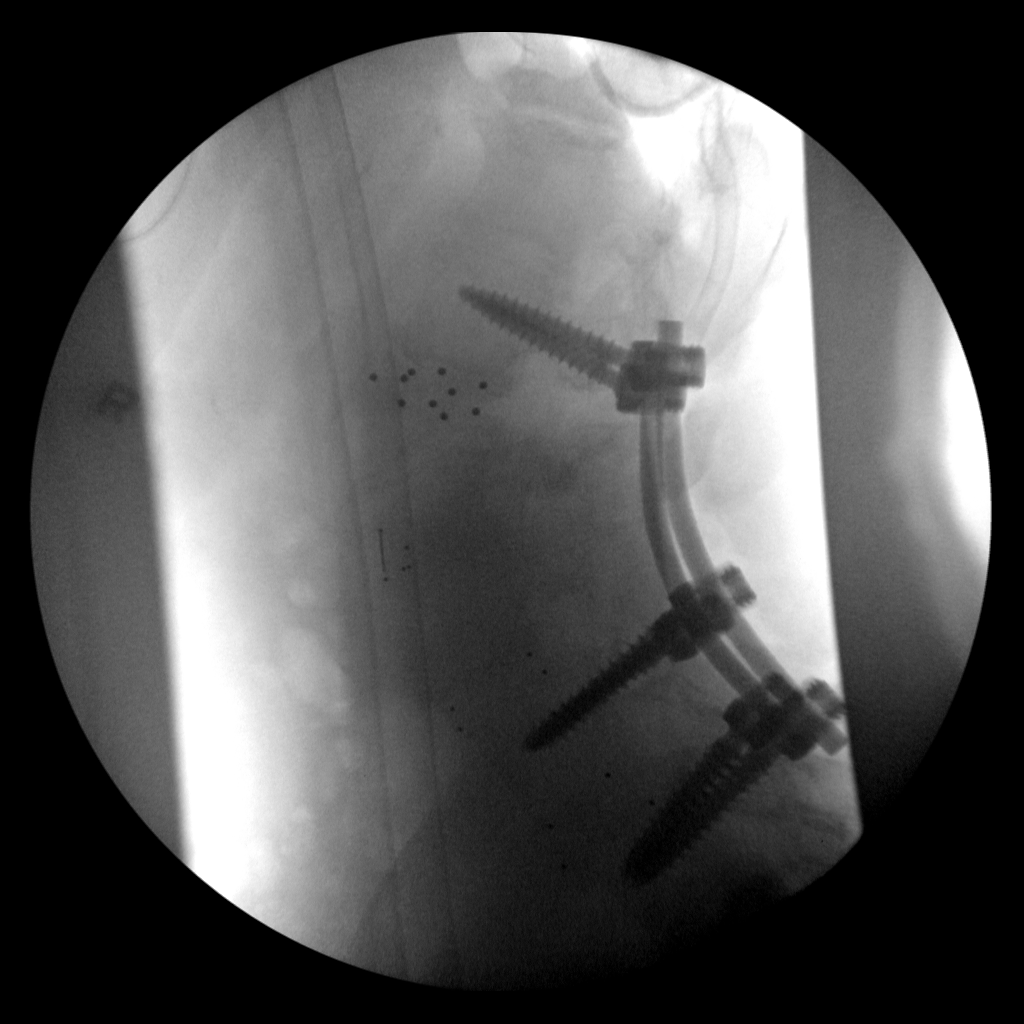
[im 2/2]
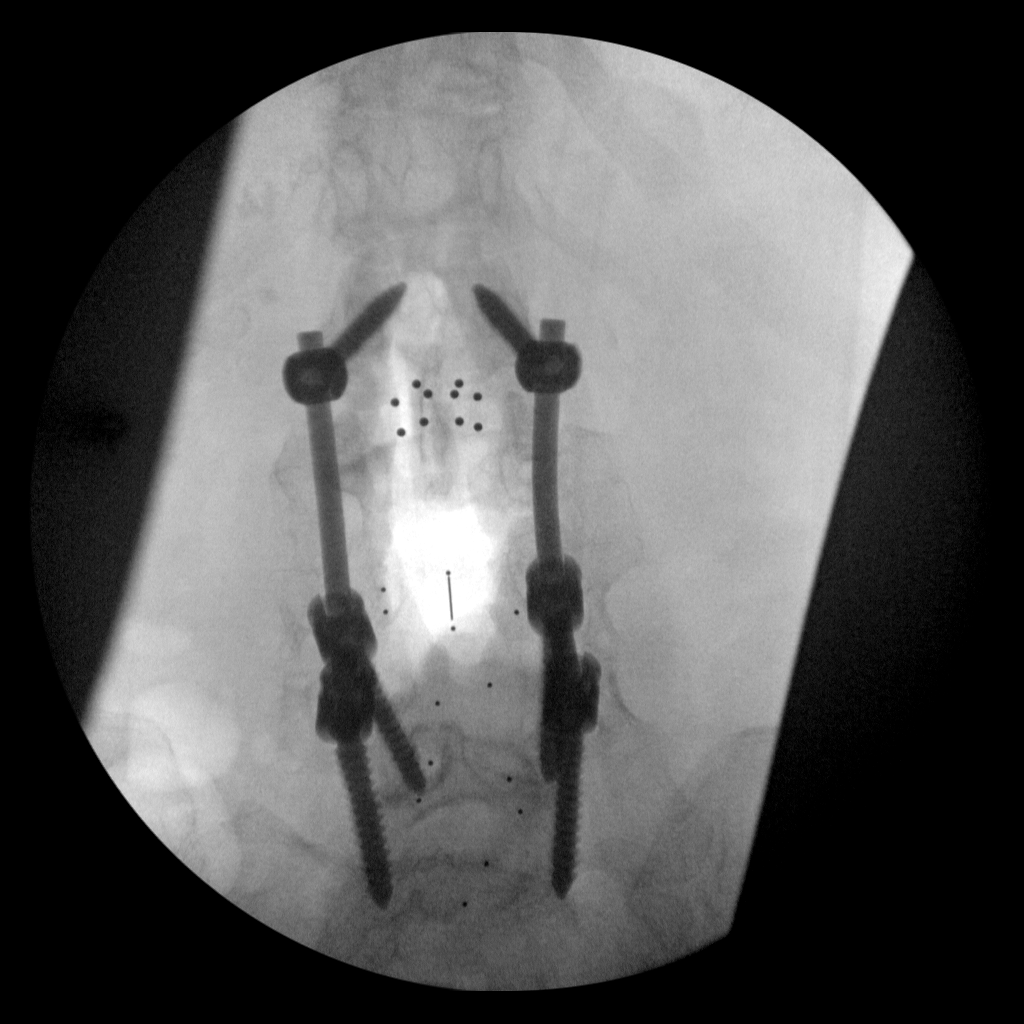

[2 of 2 positions shown; findings below may reference images not displayed]

FINDINGS: Two fluoroscopic intraoperative spot views of the lumbar spine.
Interpreting radiologist was not present. Multilevel PLIF, 3 level
pedicle screws.
IMPRESSION: Intraoperative imaging, multilevel PLIF.

## 2019-09-25 DIAGNOSIS — R21 Rash and other nonspecific skin eruption: Secondary | ICD-10-CM | POA: Diagnosis not present

## 2019-09-25 DIAGNOSIS — B349 Viral infection, unspecified: Secondary | ICD-10-CM | POA: Diagnosis not present

## 2019-10-26 DIAGNOSIS — D2239 Melanocytic nevi of other parts of face: Secondary | ICD-10-CM | POA: Diagnosis not present

## 2019-10-26 DIAGNOSIS — L821 Other seborrheic keratosis: Secondary | ICD-10-CM | POA: Diagnosis not present

## 2019-10-26 DIAGNOSIS — D485 Neoplasm of uncertain behavior of skin: Secondary | ICD-10-CM | POA: Diagnosis not present

## 2019-10-26 DIAGNOSIS — D1801 Hemangioma of skin and subcutaneous tissue: Secondary | ICD-10-CM | POA: Diagnosis not present

## 2019-10-26 DIAGNOSIS — C44729 Squamous cell carcinoma of skin of left lower limb, including hip: Secondary | ICD-10-CM | POA: Diagnosis not present

## 2019-10-26 DIAGNOSIS — L814 Other melanin hyperpigmentation: Secondary | ICD-10-CM | POA: Diagnosis not present

## 2019-10-26 DIAGNOSIS — D692 Other nonthrombocytopenic purpura: Secondary | ICD-10-CM | POA: Diagnosis not present

## 2019-10-26 DIAGNOSIS — L918 Other hypertrophic disorders of the skin: Secondary | ICD-10-CM | POA: Diagnosis not present

## 2019-10-26 DIAGNOSIS — L820 Inflamed seborrheic keratosis: Secondary | ICD-10-CM | POA: Diagnosis not present

## 2019-10-26 DIAGNOSIS — L570 Actinic keratosis: Secondary | ICD-10-CM | POA: Diagnosis not present

## 2019-10-26 DIAGNOSIS — L853 Xerosis cutis: Secondary | ICD-10-CM | POA: Diagnosis not present

## 2019-10-26 DIAGNOSIS — Z85828 Personal history of other malignant neoplasm of skin: Secondary | ICD-10-CM | POA: Diagnosis not present

## 2019-11-23 ENCOUNTER — Ambulatory Visit (INDEPENDENT_AMBULATORY_CARE_PROVIDER_SITE_OTHER): Payer: Medicare Other | Admitting: Orthopaedic Surgery

## 2019-11-23 ENCOUNTER — Ambulatory Visit (INDEPENDENT_AMBULATORY_CARE_PROVIDER_SITE_OTHER): Payer: Medicare Other

## 2019-11-23 ENCOUNTER — Other Ambulatory Visit: Payer: Self-pay

## 2019-11-23 VITALS — Ht 60.0 in | Wt 146.0 lb

## 2019-11-23 DIAGNOSIS — M25551 Pain in right hip: Secondary | ICD-10-CM

## 2019-11-23 DIAGNOSIS — M25559 Pain in unspecified hip: Secondary | ICD-10-CM

## 2019-11-23 MED ORDER — LIDOCAINE HCL 1 % IJ SOLN
3.0000 mL | INTRAMUSCULAR | Status: AC | PRN
  Administered 2019-11-23: 3 mL

## 2019-11-23 MED ORDER — METHYLPREDNISOLONE ACETATE 40 MG/ML IJ SUSP
40.0000 mg | INTRAMUSCULAR | Status: AC | PRN
  Administered 2019-11-23: 40 mg via INTRA_ARTICULAR

## 2019-11-23 NOTE — Progress Notes (Signed)
Office Visit Note   Patient: Jeanette [name]           Date of Birth: [birth year]           MRN: [id number] Visit Date: 11/23/2019              Requested by: Merrilee Seashore, [address] [place],  [place] [address] PCP: Merrilee Seashore, MD   Assessment & Plan: Visit Diagnoses:  1. Hip pain     Plan: I did recommend a steroid injection of the trochanteric area and explained why I am recommending this.  She understands the risk and benefits of steroid injections and she did tolerate it well with the right hip.  I will have her try Voltaren gel.  I showed her stretching exercises to try.  I do feel that her water aerobics with treading water does potentially contribute to chronic inflammation in this area.  I offered formal physical therapy but she wants to try this treatment regimen first.  If this is worsening for her in any way or does not help I would set her up for outpatient physical therapy next.  Follow-up is as needed.  We can always reinject this area in 3 months if needed.  Follow-Up Instructions: Return if symptoms worsen or fail to improve.   Orders:  Orders Placed This Encounter  Procedures  . Large Joint Inj  . XR HIP UNILAT W OR W/O PELVIS 1V RIGHT   No orders of the defined types were placed in this encounter.     Procedures: Large Joint Inj: R greater trochanter on 11/23/2019 3:40 PM Indications: pain and diagnostic evaluation Details: 22 G 1.5 in needle, lateral approach  Arthrogram: No  Medications: 3 mL lidocaine 1 %; 40 mg methylPREDNISolone acetate 40 MG/ML Outcome: tolerated well, no immediate complications Procedure, treatment alternatives, risks and benefits explained, specific risks discussed. Consent was given by the patient. Immediately prior to procedure a time out was called to verify the correct patient, procedure, equipment, support staff and site/side marked as required. Patient was prepped and draped in the usual sterile  fashion.       Clinical Data: No additional findings.   Subjective: Chief Complaint  Patient presents with  . Right Hip - Pain  The patient is a very pleasant 74 year old female who comes in with a several month history of right hip pain.  She reports the trochanteric area as a source of her pain of her right hip.  She denies any groin pain.  It hurts with going up and down stairs.  She does water aerobics 3 days a week.  She is otherwise healthy individual.  She is not a diabetic.  She is very active.  She denies any left hip pain.  She has never had surgery on her hip or her knee.  HPI  Review of Systems She currently denies any headache, chest pain, shortness of breath, fever, chills, nausea, vomiting  Objective: Vital Signs: Ht 5' (1.524 m)   Wt 146 lb (66.2 kg)   BMI 28.51 kg/m   Physical Exam She is alert and orient x3 and in no acute distress Ortho Exam Examination of both hips show the move smoothly and fluidly.  Her right painful hip has normal and full range of motion.  When I had her lay on her side she has severe pain to palpation of the trochanteric area and the proximal IT band on the right side.  The remainder of her  back and lower extremity exam is normal. Specialty Comments:  No specialty comments available.  Imaging: XR HIP UNILAT W OR W/O PELVIS 1V RIGHT  Result Date: 11/23/2019 An AP pelvis and lateral right hip shows no acute findings.  The hip joint space is very well-maintained.  There is no cortical irregularities around the trochanteric area where the patient hurts.    PMFS History: Patient Active Problem List   Diagnosis Date Noted  . Lumbar stenosis with neurogenic claudication 01/04/2018  . Carotid stenosis, right 05/28/2013  . Neck pain on right side 05/28/2013  . Cervical spondylosis without myelopathy 10/14/2012  . Hypertension   . Thyroid disease   . Endometrial polyp   . TIA (transient ischemic attack)    Past Medical History:    Diagnosis Date  . Arthritis   . Asthma    hx  . Hypertension   . Hypothyroidism   . Neuromuscular disorder (HCC)    tremors in hands  . Pneumonia    hx  . PONV (postoperative nausea and vomiting)    has scop patch to apply  10/13/12  . Stroke [facility]) 2011   tia's  . Thyroid disease   . TIA (transient ischemic attack)     Family History  Problem Relation Age of Onset  . Leukemia Mother   . Ovarian cancer Mother   . Tremor Mother   . Polycythemia Mother   . Hypertension Father   . Heart disease Father   . Parkinson's disease Maternal Aunt   . Stroke Maternal Aunt   . Parkinson's disease Maternal Uncle   . Multiple sclerosis Maternal Uncle   . Colon cancer Neg Hx     Past Surgical History:  Procedure Laterality Date  . ANTERIOR CERVICAL DECOMP/DISCECTOMY FUSION N/A 10/14/2012   Procedure: ANTERIOR CERVICAL DECOMPRESSION/DISCECTOMY FUSION 1 LEVEL/HARDWARE REMOVAL;  Surgeon: Kristeen Miss, MD;  Location: [place] [facility];  Service: Neurosurgery;  Laterality: N/A;  C7-T1 Anterior cervical decompression/diskectomy/fusion/removal of old synthes plate  . BACK SURGERY  01  . BACK SURGERY  2019  . DILATION AND CURETTAGE OF UTERUS    . HYSTEROSCOPY  2004, 2007   endometrial polyp  . KNEE SURGERY Right    74 yrs old  . NASAL SINUS SURGERY Right 08/2016   polyp removed  . NECK SURGERY  07,03   cerv,Fusion  . ROTATOR CUFF REPAIR Right 2015  . TONSILLECTOMY     Social History   Occupational History    Comment: retired Insurance claims handler  Tobacco Use  . Smoking status: Never Smoker  . Smokeless tobacco: Never Used  Vaping Use  . Vaping Use: Never used  Substance and Sexual Activity  . Alcohol use: Yes    Alcohol/week: 1.0 standard drink    Types: 1 Glasses of wine per week    Comment: wine 1-2 week  . Drug use: No  . Sexual activity: Yes    Birth control/protection: Post-menopausal    Comment: 1st intercourse 76 yo-1 partner

## 2019-12-26 ENCOUNTER — Ambulatory Visit (INDEPENDENT_AMBULATORY_CARE_PROVIDER_SITE_OTHER): Payer: Medicare Other | Admitting: Diagnostic Neuroimaging

## 2019-12-26 ENCOUNTER — Encounter: Payer: Self-pay | Admitting: Diagnostic Neuroimaging

## 2019-12-26 VITALS — BP 118/81 | HR 64 | Ht 59.0 in | Wt 145.0 lb

## 2019-12-26 DIAGNOSIS — G250 Essential tremor: Secondary | ICD-10-CM

## 2019-12-26 DIAGNOSIS — G20 Parkinson's disease: Secondary | ICD-10-CM

## 2019-12-26 MED ORDER — CARBIDOPA-LEVODOPA 25-100 MG PO TABS: 2.0000 | ORAL_TABLET | Freq: Three times a day (TID) | ORAL | 4 refills | Status: DC

## 2019-12-26 MED ORDER — PROPRANOLOL HCL ER 80 MG PO CP24: 80.0000 mg | ORAL_CAPSULE | Freq: Every day | ORAL | 4 refills | Status: DC

## 2019-12-26 NOTE — Progress Notes (Signed)
[facility]  PATIENT: Jeanette [name] DOB: Mar 10, 1946  REFERRING CLINICIAN: Ashby Dawes HISTORY FROM: patient and husband REASON FOR VISIT: follow up    HISTORICAL  CHIEF COMPLAINT:  Chief Complaint  Patient presents with  . Parkinsonism    rm 7, 1 year FU, husband- [name] "tremors are worse, have never taken two sinemet 3 x daily, it makes me feel nauseated unless I eat a little bit"    HISTORY OF PRESENT ILLNESS:   UPDATE (12/26/19, VRP): Since last visit, doing well. Symptoms are slightly progressed. Severity mild-mod. No alleviating or aggravating factors. Tolerating meds. Taking carb/levo 3-4 tabs per day. Tremor slightly increased. Balance stable.  UPDATE (12/20/18, VRP): Since last visit, doing well. Symptoms are stable. Severity is mild. No alleviating or aggravating factors. Tolerating meds. Some afternoon tremors are noticeable. Has had some falls.   UPDATE (03/21/18, VRP): Since last visit, doing more head tremor issues in last 3-4 months. Symptoms are progressive and mild. No alleviating or aggravating factors. Tolerating meds.    UPDATE (04/05/17, VRP): Since last visit, doing well. Tolerating meds. No alleviating or aggravating factors. Carb/levo is helping. Patient taking twice a day.   UPDATE 09/29/16: Since last visit, doing well. Carb/levo helping tremor control. Mainly taking twice a day. No other new symptoms.  UPDATE 06/23/16: Since last visit no change in sxs. MRI reviewed. Ready to start carb/levo.   PRIOR HPI (03/11/16): 74 year old female here for evaluation of tremor. Patient is right handed and has hypertension. For past 8-10 years, patient has had intermittent postural and action tremor. This is progressively worsened over time. More recently she has noticed subtle resting tremor. Patient is having more difficulty with holding objects, drinking from a cup, handwriting. Symptoms fluctuate from day to day. She has a glass of wine sometimes this  calms down some of her tremor. Patient has been on propranolol for blood pressure control, and was advised to take an extra tablet of this to help with tremor control. However patient has not tried taking the extra propranolol. Patient has significant family history for tremor. Patient's mother had tremor but not specifically diagnosed. Patient's mother also had polycythemia vera and later leukemia. Patient's son has tremor starting at age 43 years old. Patient's maternal aunts 2 and maternal uncle 1 at Parkinson's disease. Several first cousins on her mother's side also have Parkinson's disease. A different maternal uncle had multiple sclerosis. Patient also has noted some hoarse voice, intermittent choking sensation, mild drooling from the right side of her mouth. No change in smell or taste. No significant anxiety or constipation. No leg tremor. She has some mild balance and walking difficulty.  Patient has had multiple cervical spine surgeries in 2003, 2007 in 2014. 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 She is also had lumbar spine surgeries in 2001 and 2014. Patient had some complication of surgery including hoarse voice.  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 Conventional angiogram was performed which showed tortuous right ICA without evidence of dissection.   REVIEW OF SYSTEMS: Full 14 system review of systems performed and negative with exception of: as per HPI.   ALLERGIES: Allergies  Allergen Reactions  . Compazine [Prochlorperazine Edisylate] Anaphylaxis and Swelling    Mouth and tongue swell, throat swells closed    HOME MEDICATIONS: Outpatient Medications Prior to Visit  Medication Sig Dispense Refill  . acetaminophen (TYLENOL) 500 MG tablet Take  1,000 mg by mouth daily as needed for moderate pain or headache.    . albuterol (PROVENTIL HFA;VENTOLIN HFA) 108 (90  BASE) MCG/ACT inhaler Inhale 2 puffs into the lungs every 6 (six) hours as needed for wheezing.     . carbidopa-levodopa (SINEMET IR) 25-100 MG tablet Take 1-2 tablets by mouth 3 (three) times daily. 180 tablet 12  . Cholecalciferol (VITAMIN D3) 5000 units CAPS Take 5,000 Units by mouth daily.    Marland Kitchen losartan (COZAAR) 100 MG tablet Take 100 mg by mouth daily.    . propranolol ER (INDERAL LA) 80 MG 24 hr capsule Take 1 capsule (80 mg total) by mouth daily. 90 capsule 4  . SYNTHROID 88 MCG tablet Take 88 mcg by mouth daily.    Marland Kitchen venlafaxine XR (EFFEXOR-XR) 37.5 MG 24 hr capsule Take 37.5 mg by mouth every evening.      No facility-administered medications prior to visit.    PAST MEDICAL HISTORY: Past Medical History:  Diagnosis Date  . Arthritis   . Asthma    hx  . Hypertension   . Hypothyroidism   . Neuromuscular disorder (HCC)    tremors in hands  . Pneumonia    hx  . PONV (postoperative nausea and vomiting)    has scop patch to apply  10/13/12  . Stroke [facility]) 2011   tia's  . Thyroid disease   . TIA (transient ischemic attack)     PAST SURGICAL HISTORY: Past Surgical History:  Procedure Laterality Date  . ANTERIOR CERVICAL DECOMP/DISCECTOMY FUSION N/A 10/14/2012   Procedure: ANTERIOR CERVICAL DECOMPRESSION/DISCECTOMY FUSION 1 LEVEL/HARDWARE REMOVAL;  Surgeon: Kristeen Miss, MD;  Location: [place] [facility];  Service: Neurosurgery;  Laterality: N/A;  C7-T1 Anterior cervical decompression/diskectomy/fusion/removal of old synthes plate  . BACK SURGERY  01  . BACK SURGERY  2019  . DILATION AND CURETTAGE OF UTERUS    . HYSTEROSCOPY  2004, 2007   endometrial polyp  . KNEE SURGERY Right    74 yrs old  . NASAL SINUS SURGERY Right 08/2016   polyp removed  . NECK SURGERY  07,03   cerv,Fusion  . ROTATOR CUFF REPAIR Right 2015  . TONSILLECTOMY      FAMILY HISTORY: Family History  Problem Relation Age of Onset  . Leukemia Mother   . Ovarian cancer Mother   . Tremor Mother   .  Polycythemia Mother   . Hypertension Father   . Heart disease Father   . Parkinson's disease Maternal Aunt   . Stroke Maternal Aunt   . Parkinson's disease Maternal Uncle   . Multiple sclerosis Maternal Uncle   . Colon cancer Neg Hx     SOCIAL HISTORY:  Social History   Socioeconomic History  . Marital status: Married    Spouse name: [name]  . Number of children: 2  . Years of education: 90  . Highest education level: Not on file  Occupational History    Comment: retired Insurance claims handler  Tobacco Use  . Smoking status: Never Smoker  . Smokeless tobacco: Never Used  Vaping Use  . Vaping Use: Never used  Substance and Sexual Activity  . Alcohol use: Yes    Alcohol/week: 1.0 standard drink    Types: 1 Glasses of wine per week    Comment: wine 1-2 week  . Drug use: No  . Sexual activity: Yes    Birth control/protection: Post-menopausal    Comment: 1st intercourse 81 yo-1 partner  Other Topics Concern  . Not on file  Social History Narrative   Lives with husband   Caffeine - coffee/tea, 1-2 cups daily   Social Determinants of Health   Financial Resource Strain:   . Difficulty of Paying Living Expenses:   Food Insecurity:   . Worried About Charity fundraiser in the Last Year:   . Arboriculturist in the Last Year:   Transportation Needs:   . Film/video editor (Medical):   Marland Kitchen Lack of Transportation (Non-Medical):   Physical Activity:   . Days of Exercise per Week:   . Minutes of Exercise per Session:   Stress:   . Feeling of Stress :   Social Connections:   . Frequency of Communication with Friends and Family:   . Frequency of Social Gatherings with Friends and Family:   . Attends Religious Services:   . Active Member of Clubs or Organizations:   . Attends Archivist Meetings:   Marland Kitchen Marital Status:   Intimate Partner Violence:   . Fear of Current or Ex-Partner:   . Emotionally Abused:   Marland Kitchen Physically Abused:   . Sexually Abused:      PHYSICAL  EXAM  GENERAL EXAM/CONSTITUTIONAL: Vitals:  Vitals:   12/26/19 1050  BP: 118/81  Pulse: 64  Weight: 145 lb (65.8 kg)  Height: 4\' 11"  (1.499 m)   Body mass index is 29.29 kg/m. No exam data present  Patient is in no distress; well developed, nourished and groomed; neck is supple  CARDIOVASCULAR:  Examination of carotid arteries is normal; no carotid bruits  Regular rate and rhythm, no murmurs  Examination of peripheral vascular system by observation and palpation is normal  EYES:  Ophthalmoscopic exam of optic discs and posterior segments is normal; no papilledema or hemorrhages  MUSCULOSKELETAL:  Gait, strength, tone, movements noted in Neurologic exam below  NEUROLOGIC: MENTAL STATUS:  No flowsheet data found.  awake, alert, oriented to person, place and time  recent and remote memory intact  normal attention and concentration  language fluent, comprehension intact, naming intact,   fund of knowledge appropriate  CRANIAL NERVE:   2nd - no papilledema on fundoscopic exam  2nd, 3rd, 4th, 6th - pupils equal and reactive to light, visual fields full to confrontation, extraocular muscles intact, no nystagmus  5th - facial sensation symmetric  7th - facial strength symmetric  8th - hearing intact  9th - palate elevates symmetrically, uvula midline  11th - shoulder shrug symmetric  12th - tongue protrusion midline  SUBTLE VOICE TREMOR  SLIGHTLY HOARSE VOICE  MOTOR:   normal bulk, full strength in the BUE, BLE  NORMAL TONE IN BUE AND BLE  MILD POSTURAL AND ACTION TREMOR IN BUE  RARE RESTING TREMOR BUE AND BLE  MILD BRADYKINESIA IN LUE AND LLE   SENSORY:   normal and symmetric to light touch  COORDINATION:   finger-nose-finger, fine finger movements normal  REFLEXES:   deep tendon reflexes present and symmetric  TRACE AT ANKLES  GAIT/STATION:   narrow based gait; CAUTIOUS GAIT; SHORT STEPS; DECR ARM SWING; mild tremor in right  hand with walking     DIAGNOSTIC DATA (LABS, IMAGING, TESTING) - I reviewed patient records, labs, notes, testing and imaging myself where available.  Lab Results  Component Value Date   WBC 15.3 (H) 01/05/2018   HGB 9.4 (L) 01/05/2018   HCT 30.4 (L) 01/05/2018   MCV 95.0 01/05/2018   PLT 288 01/05/2018      Component Value Date/Time   NA  139 01/05/2018 0311   K 4.4 01/05/2018 0311   CL 107 01/05/2018 0311   CO2 27 01/05/2018 0311   GLUCOSE 161 (H) 01/05/2018 0311   BUN 15 01/05/2018 0311   CREATININE 0.70 07/28/2018 1014   CREATININE 1.61 (H) 06/27/2015 1407   CALCIUM 8.4 (L) 01/05/2018 0311   PROT 7.1 12/30/2010 0935   ALBUMIN 3.9 12/30/2010 0935   AST 19 12/30/2010 0935   ALT 14 12/30/2010 0935   ALKPHOS 79 12/30/2010 0935   BILITOT 0.5 12/30/2010 0935   GFRNONAA >60 01/05/2018 0311   GFRAA >60 01/05/2018 0311   No results found for: CHOL, HDL, LDLCALC, LDLDIRECT, TRIG, CHOLHDL No results found for: HGBA1C No results found for: VITAMINB12 No results found for: TSH  10/01/12 MRI cervical spine [I reviewed images myself and agree with interpretation. -VRP]  - Fusion from C3-C7 has a satisfactory appearance.  Mild gliosis of the cord at the C4-5 level. - Progressive degenerative disease at C7-T1.  Advanced facet arthropathy with anterolisthesis of 5 mm.  Disc degeneration and bulging.  Effacement subarachnoid space.  Bilateral neural foraminal stenosis that could compress either or both C8 nerve roots.  This is more pronounced on the right.  23-Feb-2013 MRI lumbar spine [I reviewed images myself and agree with interpretation. -VRP]  1. New broad-based disc protrusion at L1-2 with slight caudal down turning and mild to moderate mass effect on the thecal sac. 2. Stable moderate to moderately severe multifactorial spinal and bilateral lateral recess stenosis at L2-3. 3. Stable posterior and interbody fusion changes at L3-4 and L4-5.  05/28/13 CT neck  1. Focal tapering of  the right internal carotid artery with decrease caliber of the distal ICA. 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 An acute dissection is also considered. 2. The vocal cords appear symmetric. An acute vascular event could be affecting function of the will vocal cords. 3. Extensive cervical spine fusion.  05/28/13 carotid and vertebral angiogram - Angiographically no evidence of intimal flaps, intraluminal filling defects or stenosis to suggest dissection of the carotid artery systems bilaterally. - Mild prominence of the right internal jugular vein in the neck without stenoses. 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  03/20/16 MRI brain [I reviewed images myself and agree with interpretation. There is possible decreased caliber of right ICA on T1 views, but not definitely on T2 views. -VRP]  1.   The brain appears normal for age. 2.    As noted on the MRI from 02/26/2009, there is asymmetry of the ICA at the skull base, with right caliber smaller than the left. This was evaluated on CT angiography 05/28/2013 confirming the reduced caliber.  Consider follow-up evaluation if clinically indicated. 3.    Chronic inflammatory changes involving the right maxillary sinus and adjacent nasopharynx. Although likely due to inflammatory changes, consider dedicated sinus imaging to evaluate to determine if there is erosion of the bones that could imply a more pathologic process. 4.    Degenerative changes at C2-C3 with apparent mild spinal stenosis.  10/13/16 CTA head / neck  1. Negative head and neck CTA aside from arterial tortuosity. Minimal intra- and extracranial atherosclerosis with no stenosis identified. The right ICA is mildly non-dominant due to dominance of the left ACA A1 segment, and there is chronic severe tortuosity of the cervical right ICA. 2.  Normal for age non contrast CT appearance of the brain. 3. Widespread chronic cervical  spine fusion from the C3 to  the C7 level. Sequelae of C7-T1 ACDF but pseudarthrosis at that level. 4. Chronic right maxillary sinusitis.     ASSESSMENT AND PLAN  74 y.o. year old female here with postural and action tremor in upper extremities for past 8-10 years, now with increasing resting tremor, bradykinesia and rigidity. 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 Also with history of cervical myelopathy and lumbar radiculopathy.  Dx: parkinson's disease + essential tremor + h/o cervical myelopathy + h/o lumbar radiculopathy  1. Parkinsonism, unspecified Parkinsonism type ([place])   2. Essential tremor      PLAN:  PARKINSONISM - continue carbidopa/levodopa for parkinsonism and resting tremor (increase to 2 tabs three times a day) - resume PT exercises at home for balance and gait; use cane or walker as needed  ESSENTIAL TREMOR - continue propranolol 80mg  daily for essential tremor and BP control  RIGHT ICA NARROWING / STENOSIS - improved on June 2017 CTA head/neck; monitor for now  Meds ordered this encounter  Medications  . carbidopa-levodopa (SINEMET IR) 25-100 MG tablet    Sig: Take 2 tablets by mouth 3 (three) times daily.    Dispense:  540 tablet    Refill:  4  . propranolol ER (INDERAL LA) 80 MG 24 hr capsule    Sig: Take 1 capsule (80 mg total) by mouth daily.    Dispense:  90 capsule    Refill:  4   Return in about 1 year (around 12/25/2020).    Penni Bombard, MD 9/62/2297, 98:92 AM Certified in Neurology, Neurophysiology and Neuroimaging  [facility] [address] [place], [place] [address] [phone]

## 2020-01-11 DIAGNOSIS — Z85828 Personal history of other malignant neoplasm of skin: Secondary | ICD-10-CM | POA: Diagnosis not present

## 2020-01-11 DIAGNOSIS — L820 Inflamed seborrheic keratosis: Secondary | ICD-10-CM | POA: Diagnosis not present

## 2020-02-07 DIAGNOSIS — L820 Inflamed seborrheic keratosis: Secondary | ICD-10-CM | POA: Diagnosis not present

## 2020-02-07 DIAGNOSIS — D485 Neoplasm of uncertain behavior of skin: Secondary | ICD-10-CM | POA: Diagnosis not present

## 2020-02-07 DIAGNOSIS — L570 Actinic keratosis: Secondary | ICD-10-CM | POA: Diagnosis not present

## 2020-02-07 DIAGNOSIS — Z85828 Personal history of other malignant neoplasm of skin: Secondary | ICD-10-CM | POA: Diagnosis not present

## 2020-03-01 DIAGNOSIS — Z23 Encounter for immunization: Secondary | ICD-10-CM | POA: Diagnosis not present

## 2020-03-06 ENCOUNTER — Encounter: Payer: Self-pay | Admitting: Gynecology

## 2020-03-06 DIAGNOSIS — Z1231 Encounter for screening mammogram for malignant neoplasm of breast: Secondary | ICD-10-CM | POA: Diagnosis not present

## 2020-03-12 DIAGNOSIS — Z23 Encounter for immunization: Secondary | ICD-10-CM | POA: Diagnosis not present

## 2020-04-18 DIAGNOSIS — Z85828 Personal history of other malignant neoplasm of skin: Secondary | ICD-10-CM | POA: Diagnosis not present

## 2020-04-18 DIAGNOSIS — D485 Neoplasm of uncertain behavior of skin: Secondary | ICD-10-CM | POA: Diagnosis not present

## 2020-04-18 DIAGNOSIS — C44329 Squamous cell carcinoma of skin of other parts of face: Secondary | ICD-10-CM | POA: Diagnosis not present

## 2020-05-08 DIAGNOSIS — C44321 Squamous cell carcinoma of skin of nose: Secondary | ICD-10-CM | POA: Diagnosis not present

## 2020-05-08 DIAGNOSIS — Z85828 Personal history of other malignant neoplasm of skin: Secondary | ICD-10-CM | POA: Diagnosis not present

## 2020-08-08 DIAGNOSIS — M5136 Other intervertebral disc degeneration, lumbar region: Secondary | ICD-10-CM | POA: Diagnosis not present

## 2020-08-08 DIAGNOSIS — I10 Essential (primary) hypertension: Secondary | ICD-10-CM | POA: Diagnosis not present

## 2020-08-08 DIAGNOSIS — E039 Hypothyroidism, unspecified: Secondary | ICD-10-CM | POA: Diagnosis not present

## 2020-08-08 DIAGNOSIS — E782 Mixed hyperlipidemia: Secondary | ICD-10-CM | POA: Diagnosis not present

## 2020-08-16 DIAGNOSIS — E782 Mixed hyperlipidemia: Secondary | ICD-10-CM | POA: Diagnosis not present

## 2020-08-16 DIAGNOSIS — N390 Urinary tract infection, site not specified: Secondary | ICD-10-CM | POA: Diagnosis not present

## 2020-08-16 DIAGNOSIS — I10 Essential (primary) hypertension: Secondary | ICD-10-CM | POA: Diagnosis not present

## 2020-08-16 DIAGNOSIS — E039 Hypothyroidism, unspecified: Secondary | ICD-10-CM | POA: Diagnosis not present

## 2020-08-16 DIAGNOSIS — M1000 Idiopathic gout, unspecified site: Secondary | ICD-10-CM | POA: Diagnosis not present

## 2020-09-04 DIAGNOSIS — H04123 Dry eye syndrome of bilateral lacrimal glands: Secondary | ICD-10-CM | POA: Diagnosis not present

## 2020-09-04 DIAGNOSIS — H25813 Combined forms of age-related cataract, bilateral: Secondary | ICD-10-CM | POA: Diagnosis not present

## 2020-09-07 DIAGNOSIS — E782 Mixed hyperlipidemia: Secondary | ICD-10-CM | POA: Diagnosis not present

## 2020-09-07 DIAGNOSIS — E039 Hypothyroidism, unspecified: Secondary | ICD-10-CM | POA: Diagnosis not present

## 2020-09-07 DIAGNOSIS — M5136 Other intervertebral disc degeneration, lumbar region: Secondary | ICD-10-CM | POA: Diagnosis not present

## 2020-09-07 DIAGNOSIS — I10 Essential (primary) hypertension: Secondary | ICD-10-CM | POA: Diagnosis not present

## 2020-09-12 DIAGNOSIS — Z0000 Encounter for general adult medical examination without abnormal findings: Secondary | ICD-10-CM | POA: Diagnosis not present

## 2020-09-26 DIAGNOSIS — E782 Mixed hyperlipidemia: Secondary | ICD-10-CM | POA: Diagnosis not present

## 2020-09-26 DIAGNOSIS — E039 Hypothyroidism, unspecified: Secondary | ICD-10-CM | POA: Diagnosis not present

## 2020-09-26 DIAGNOSIS — J4520 Mild intermittent asthma, uncomplicated: Secondary | ICD-10-CM | POA: Diagnosis not present

## 2020-09-26 DIAGNOSIS — M5136 Other intervertebral disc degeneration, lumbar region: Secondary | ICD-10-CM | POA: Diagnosis not present

## 2020-09-26 DIAGNOSIS — E559 Vitamin D deficiency, unspecified: Secondary | ICD-10-CM | POA: Diagnosis not present

## 2020-09-26 DIAGNOSIS — I10 Essential (primary) hypertension: Secondary | ICD-10-CM | POA: Diagnosis not present

## 2020-09-26 DIAGNOSIS — M1000 Idiopathic gout, unspecified site: Secondary | ICD-10-CM | POA: Diagnosis not present

## 2020-09-26 DIAGNOSIS — G250 Essential tremor: Secondary | ICD-10-CM | POA: Diagnosis not present

## 2020-09-26 DIAGNOSIS — T7840XA Allergy, unspecified, initial encounter: Secondary | ICD-10-CM | POA: Diagnosis not present

## 2020-10-08 DIAGNOSIS — E039 Hypothyroidism, unspecified: Secondary | ICD-10-CM | POA: Diagnosis not present

## 2020-10-08 DIAGNOSIS — E782 Mixed hyperlipidemia: Secondary | ICD-10-CM | POA: Diagnosis not present

## 2020-10-08 DIAGNOSIS — I10 Essential (primary) hypertension: Secondary | ICD-10-CM | POA: Diagnosis not present

## 2020-11-07 DIAGNOSIS — E039 Hypothyroidism, unspecified: Secondary | ICD-10-CM | POA: Diagnosis not present

## 2020-11-07 DIAGNOSIS — M5136 Other intervertebral disc degeneration, lumbar region: Secondary | ICD-10-CM | POA: Diagnosis not present

## 2020-11-07 DIAGNOSIS — I10 Essential (primary) hypertension: Secondary | ICD-10-CM | POA: Diagnosis not present

## 2020-11-07 DIAGNOSIS — E782 Mixed hyperlipidemia: Secondary | ICD-10-CM | POA: Diagnosis not present

## 2020-11-13 DIAGNOSIS — C44722 Squamous cell carcinoma of skin of right lower limb, including hip: Secondary | ICD-10-CM | POA: Diagnosis not present

## 2020-11-13 DIAGNOSIS — Z85828 Personal history of other malignant neoplasm of skin: Secondary | ICD-10-CM | POA: Diagnosis not present

## 2020-11-13 DIAGNOSIS — L820 Inflamed seborrheic keratosis: Secondary | ICD-10-CM | POA: Diagnosis not present

## 2020-11-13 DIAGNOSIS — L821 Other seborrheic keratosis: Secondary | ICD-10-CM | POA: Diagnosis not present

## 2020-11-13 DIAGNOSIS — D225 Melanocytic nevi of trunk: Secondary | ICD-10-CM | POA: Diagnosis not present

## 2020-11-13 DIAGNOSIS — D485 Neoplasm of uncertain behavior of skin: Secondary | ICD-10-CM | POA: Diagnosis not present

## 2020-11-13 DIAGNOSIS — L570 Actinic keratosis: Secondary | ICD-10-CM | POA: Diagnosis not present

## 2020-11-15 DIAGNOSIS — H25811 Combined forms of age-related cataract, right eye: Secondary | ICD-10-CM | POA: Diagnosis not present

## 2020-11-15 DIAGNOSIS — H2511 Age-related nuclear cataract, right eye: Secondary | ICD-10-CM | POA: Diagnosis not present

## 2020-12-08 DIAGNOSIS — E782 Mixed hyperlipidemia: Secondary | ICD-10-CM | POA: Diagnosis not present

## 2020-12-08 DIAGNOSIS — E039 Hypothyroidism, unspecified: Secondary | ICD-10-CM | POA: Diagnosis not present

## 2020-12-08 DIAGNOSIS — I10 Essential (primary) hypertension: Secondary | ICD-10-CM | POA: Diagnosis not present

## 2020-12-18 DIAGNOSIS — D485 Neoplasm of uncertain behavior of skin: Secondary | ICD-10-CM | POA: Diagnosis not present

## 2020-12-18 DIAGNOSIS — C44629 Squamous cell carcinoma of skin of left upper limb, including shoulder: Secondary | ICD-10-CM | POA: Diagnosis not present

## 2020-12-18 DIAGNOSIS — C44722 Squamous cell carcinoma of skin of right lower limb, including hip: Secondary | ICD-10-CM | POA: Diagnosis not present

## 2020-12-18 DIAGNOSIS — Z85828 Personal history of other malignant neoplasm of skin: Secondary | ICD-10-CM | POA: Diagnosis not present

## 2020-12-18 DIAGNOSIS — H0014 Chalazion left upper eyelid: Secondary | ICD-10-CM | POA: Diagnosis not present

## 2020-12-18 DIAGNOSIS — H25812 Combined forms of age-related cataract, left eye: Secondary | ICD-10-CM | POA: Diagnosis not present

## 2020-12-18 DIAGNOSIS — Z961 Presence of intraocular lens: Secondary | ICD-10-CM | POA: Diagnosis not present

## 2020-12-31 ENCOUNTER — Encounter: Payer: Self-pay | Admitting: Diagnostic Neuroimaging

## 2020-12-31 ENCOUNTER — Ambulatory Visit (INDEPENDENT_AMBULATORY_CARE_PROVIDER_SITE_OTHER): Payer: Medicare Other | Admitting: Diagnostic Neuroimaging

## 2020-12-31 VITALS — BP 136/81 | HR 61 | Ht 60.0 in | Wt 147.0 lb

## 2020-12-31 DIAGNOSIS — G250 Essential tremor: Secondary | ICD-10-CM

## 2020-12-31 DIAGNOSIS — G20 Parkinson's disease: Secondary | ICD-10-CM

## 2020-12-31 MED ORDER — CARBIDOPA-LEVODOPA 25-100 MG PO TABS: 1.0000 | ORAL_TABLET | Freq: Three times a day (TID) | ORAL | 4 refills | Status: DC

## 2020-12-31 MED ORDER — PRIMIDONE 50 MG PO TABS: 25.0000 mg | ORAL_TABLET | Freq: Two times a day (BID) | ORAL | 5 refills | Status: DC

## 2020-12-31 NOTE — Patient Instructions (Addendum)
Parkinsonism - check DATscan - continue carbidopa/levodopa   ESSENTIAL TREMOR - continue propranolol '80mg'$  daily for essential tremor and BP control - add primidone '25mg'$  daily; increase up to '50mg'$  twice a day

## 2020-12-31 NOTE — Progress Notes (Signed)
[facility]  PATIENT: Jeanette [name] DOB: 02/09/1946  REFERRING CLINICIAN: Merrilee Seashore, MD HISTORY FROM: patient REASON FOR VISIT: follow up    HISTORICAL  CHIEF COMPLAINT:  Chief Complaint  Patient presents with   Follow-up    Rm 6 here for yearly f/u on Parkinson's. Pt reports she has been doing well, shaking still present in bilateral hands, feels like sx maybe worse.    HISTORY OF PRESENT ILLNESS:   UPDATE (12/31/20, VRP): Since last visit, doing about the same. Resting tremor and balance stable. Postural tremor more bothersome. Taking 1-2 tabs carb/levo; three times a day; nausea limits dosing.   UPDATE (12/26/19, VRP): Since last visit, doing well. Symptoms are slightly progressed. Severity mild-mod. No alleviating or aggravating factors. Tolerating meds. Taking carb/levo 3-4 tabs per day. Tremor slightly increased. Balance stable.  UPDATE (12/20/18, VRP): Since last visit, doing well. Symptoms are stable. Severity is mild. No alleviating or aggravating factors. Tolerating meds. Some afternoon tremors are noticeable. Has had some falls.   UPDATE (03/21/18, VRP): Since last visit, doing more head tremor issues in last 3-4 months. Symptoms are progressive and mild. No alleviating or aggravating factors. Tolerating meds.    UPDATE (04/05/17, VRP): Since last visit, doing well. Tolerating meds. No alleviating or aggravating factors. Carb/levo is helping. Patient taking twice a day.   UPDATE 09/29/16: Since last visit, doing well. Carb/levo helping tremor control. Mainly taking twice a day. No other new symptoms.  UPDATE 06/23/16: Since last visit no change in sxs. MRI reviewed. Ready to start carb/levo.   PRIOR HPI (03/11/16): 75 year old female here for evaluation of tremor. Patient is right handed and has hypertension. For past 8-10 years, patient has had intermittent postural and action tremor. This is progressively worsened over time. More recently she  has noticed subtle resting tremor. Patient is having more difficulty with holding objects, drinking from a cup, handwriting. Symptoms fluctuate from day to day. She has a glass of wine sometimes this calms down some of her tremor. Patient has been on propranolol for blood pressure control, and was advised to take an extra tablet of this to help with tremor control. However patient has not tried taking the extra propranolol. Patient has significant family history for tremor. Patient's mother had tremor but not specifically diagnosed. Patient's mother also had polycythemia vera and later leukemia. Patient's son has tremor starting at age 28 years old. Patient's maternal aunts 2 and maternal uncle 1 at Parkinson's disease. Several first cousins on her mother's side also have Parkinson's disease. A different maternal uncle had multiple sclerosis. Patient also has noted some hoarse voice, intermittent choking sensation, mild drooling from the right side of her mouth. No change in smell or taste. No significant anxiety or constipation. No leg tremor. She has some mild balance and walking difficulty.  Patient has had multiple cervical spine surgeries in 2003, 2007 in 2014. 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 She is also had lumbar spine surgeries in 2001 and 2014. Patient had some complication of surgery including hoarse voice.  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 Conventional angiogram was performed which showed tortuous right ICA without evidence of dissection.   REVIEW OF SYSTEMS: Full 14 system review of systems performed and negative with exception of: as per HPI.   ALLERGIES: Allergies  Allergen Reactions   Compazine [Prochlorperazine Edisylate] Anaphylaxis and Swelling    Mouth  and tongue swell, throat swells closed    HOME MEDICATIONS: Outpatient Medications Prior to Visit   Medication Sig Dispense Refill   acetaminophen (TYLENOL) 500 MG tablet Take 1,000 mg by mouth daily as needed for moderate pain or headache.     albuterol (PROVENTIL HFA;VENTOLIN HFA) 108 (90 BASE) MCG/ACT inhaler Inhale 2 puffs into the lungs every 6 (six) hours as needed for wheezing.      carbidopa-levodopa (SINEMET IR) 25-100 MG tablet Take 2 tablets by mouth 3 (three) times daily. 540 tablet 4   Cholecalciferol (VITAMIN D3) 5000 units CAPS Take 5,000 Units by mouth daily.     losartan (COZAAR) 100 MG tablet Take 100 mg by mouth daily.     propranolol ER (INDERAL LA) 80 MG 24 hr capsule Take 1 capsule (80 mg total) by mouth daily. 90 capsule 4   SYNTHROID 88 MCG tablet Take 88 mcg by mouth daily.     venlafaxine XR (EFFEXOR-XR) 37.5 MG 24 hr capsule Take 37.5 mg by mouth every evening.      No facility-administered medications prior to visit.    PAST MEDICAL HISTORY: Past Medical History:  Diagnosis Date   Arthritis    Asthma    hx   Hypertension    Hypothyroidism    Neuromuscular disorder ([place])    tremors in hands   Pneumonia    hx   PONV (postoperative nausea and vomiting)    has scop patch to apply  10/13/12   Stroke [facility]) 2011   tia's   Thyroid disease    TIA (transient ischemic attack)     PAST SURGICAL HISTORY: Past Surgical History:  Procedure Laterality Date   ANTERIOR CERVICAL DECOMP/DISCECTOMY FUSION N/A 10/14/2012   Procedure: ANTERIOR CERVICAL DECOMPRESSION/DISCECTOMY FUSION 1 LEVEL/HARDWARE REMOVAL;  Surgeon: Kristeen Miss, MD;  Location: [facility];  Service: Neurosurgery;  Laterality: N/A;  C7-T1 Anterior cervical decompression/diskectomy/fusion/removal of old synthes plate   BACK SURGERY  01   BACK SURGERY  2019   DILATION AND CURETTAGE OF UTERUS     HYSTEROSCOPY  2004, 2007   endometrial polyp   KNEE SURGERY Right    75 yrs old   NASAL SINUS SURGERY Right 08/2016   polyp removed   NECK SURGERY  07,03   cerv,Fusion   ROTATOR CUFF REPAIR Right 2015    TONSILLECTOMY      FAMILY HISTORY: Family History  Problem Relation Age of Onset   Leukemia Mother    Ovarian cancer Mother    Tremor Mother    Polycythemia Mother    Hypertension Father    Heart disease Father    Parkinson's disease Maternal Aunt    Stroke Maternal Aunt    Parkinson's disease Maternal Uncle    Multiple sclerosis Maternal Uncle    Colon cancer Neg Hx     SOCIAL HISTORY:  Social History   Socioeconomic History   Marital status: Married    Spouse name: [name]   Number of children: 2   Years of education: 14   Highest education level: Not on file  Occupational History    Comment: retired Insurance claims handler  Tobacco Use   Smoking status: Never   Smokeless tobacco: Never  Vaping Use   Vaping Use: Never used  Substance and Sexual Activity   Alcohol use: Yes    Alcohol/week: 1.0 standard drink    Types: 1 Glasses of wine per week    Comment: wine 1-2 week   Drug use: No   Sexual  activity: Yes    Birth control/protection: Post-menopausal    Comment: 1st intercourse 68 yo-1 partner  Other Topics Concern   Not on file  Social History Narrative   Lives with husband   Caffeine - coffee/tea, 1-2 cups daily   Social Determinants of Health   Financial Resource Strain: Not on file  Food Insecurity: Not on file  Transportation Needs: Not on file  Physical Activity: Not on file  Stress: Not on file  Social Connections: Not on file  Intimate Partner Violence: Not on file     PHYSICAL EXAM  GENERAL EXAM/CONSTITUTIONAL: Vitals:  Vitals:   12/31/20 0925  BP: 136/81  Pulse: 61  Weight: 147 lb (66.7 kg)  Height: 5' (1.524 m)   Body mass index is 28.71 kg/m. No results found. Patient is in no distress; well developed, nourished and groomed; neck is supple  CARDIOVASCULAR: Examination of carotid arteries is normal; no carotid bruits Regular rate and rhythm, no murmurs Examination of peripheral vascular system by observation and palpation is  normal  EYES: Ophthalmoscopic exam of optic discs and posterior segments is normal; no papilledema or hemorrhages  MUSCULOSKELETAL: Gait, strength, tone, movements noted in Neurologic exam below  NEUROLOGIC: MENTAL STATUS:  No flowsheet data found. awake, alert, oriented to person, place and time recent and remote memory intact normal attention and concentration language fluent, comprehension intact, naming intact,  fund of knowledge appropriate  CRANIAL NERVE:  2nd - no papilledema on fundoscopic exam 2nd, 3rd, 4th, 6th - pupils equal and reactive to light, visual fields full to confrontation, extraocular muscles intact, no nystagmus 5th - facial sensation symmetric 7th - facial strength symmetric 8th - hearing intact 9th - palate elevates symmetrically, uvula midline 11th - shoulder shrug symmetric 12th - tongue protrusion midline MILD VOICE TREMOR SLIGHTLY HOARSE VOICE  MOTOR:  normal bulk, full strength in the BUE, BLE NORMAL TONE IN BUE AND BLE MODERATE POSTURAL AND ACTION TREMOR IN BUE RARE RESTING TREMOR BUE AND BLE MOD BRADYKINESIA IN BUE abd BLE; (right lower than left)  SENSORY:  normal and symmetric to light touch  COORDINATION:  finger-nose-finger, fine finger movements normal  REFLEXES:  deep tendon reflexes present and symmetric TRACE AT ANKLES  GAIT/STATION:  narrow based gait; CAUTIOUS GAIT; SHORT STEPS; DECR ARM SWING; mild tremor in right hand with walking     DIAGNOSTIC DATA (LABS, IMAGING, TESTING) - I reviewed patient records, labs, notes, testing and imaging myself where available.  Lab Results  Component Value Date   WBC 15.3 (H) 01/05/2018   HGB 9.4 (L) 01/05/2018   HCT 30.4 (L) 01/05/2018   MCV 95.0 01/05/2018   PLT 288 01/05/2018      Component Value Date/Time   NA 139 01/05/2018 0311   K 4.4 01/05/2018 0311   CL 107 01/05/2018 0311   CO2 27 01/05/2018 0311   GLUCOSE 161 (H) 01/05/2018 0311   BUN 15 01/05/2018 0311    CREATININE 0.70 07/28/2018 1014   CREATININE 1.61 (H) 06/27/2015 1407   CALCIUM 8.4 (L) 01/05/2018 0311   PROT 7.1 12/30/2010 0935   ALBUMIN 3.9 12/30/2010 0935   AST 19 12/30/2010 0935   ALT 14 12/30/2010 0935   ALKPHOS 79 12/30/2010 0935   BILITOT 0.5 12/30/2010 0935   GFRNONAA >60 01/05/2018 0311   GFRAA >60 01/05/2018 0311   No results found for: CHOL, HDL, LDLCALC, LDLDIRECT, TRIG, CHOLHDL No results found for: HGBA1C No results found for: VITAMINB12 No results found for: TSH  10/01/12 MRI cervical spine [I reviewed images myself and agree with interpretation. -VRP]  - Fusion from C3-C7 has a satisfactory appearance.  Mild gliosis of the cord at the C4-5 level. - Progressive degenerative disease at C7-T1.  Advanced facet arthropathy with anterolisthesis of 5 mm.  Disc degeneration and bulging.  Effacement subarachnoid space.  Bilateral neural foraminal stenosis that could compress either or both C8 nerve roots.  This is more pronounced on the right.  14-Feb-2013 MRI lumbar spine [I reviewed images myself and agree with interpretation. -VRP]  1. New broad-based disc protrusion at L1-2 with slight caudal down turning and mild to moderate mass effect on the thecal sac. 2. Stable moderate to moderately severe multifactorial spinal and bilateral lateral recess stenosis at L2-3. 3. Stable posterior and interbody fusion changes at L3-4 and L4-5.   05/28/13 CT neck  1. Focal tapering of the right internal carotid artery with decrease caliber of the distal ICA. 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 An acute dissection is also considered. 2. The vocal cords appear symmetric. An acute vascular event could be affecting function of the will vocal cords. 3. Extensive cervical spine fusion.  05/28/13 carotid and vertebral angiogram - Angiographically no evidence of intimal flaps, intraluminal filling defects or stenosis to suggest dissection of the carotid artery systems  bilaterally. - Mild prominence of the right internal jugular vein in the neck without stenoses. 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  03/20/16 MRI brain [I reviewed images myself and agree with interpretation. There is possible decreased caliber of right ICA on T1 views, but not definitely on T2 views. -VRP]  1.   The brain appears normal for age. 2.    As noted on the MRI from 02/26/2009, there is asymmetry of the ICA at the skull base, with right caliber smaller than the left. This was evaluated on CT angiography 05/28/2013 confirming the reduced caliber.  Consider follow-up evaluation if clinically indicated. 3.    Chronic inflammatory changes involving the right maxillary sinus and adjacent nasopharynx. Although likely due to inflammatory changes, consider dedicated sinus imaging to evaluate to determine if there is erosion of the bones that could imply a more pathologic process. 4.    Degenerative changes at C2-C3 with apparent mild spinal stenosis.  10/13/16 CTA head / neck  1. Negative head and neck CTA aside from arterial tortuosity. Minimal intra- and extracranial atherosclerosis with no stenosis identified. The right ICA is mildly non-dominant due to dominance of the left ACA A1 segment, and there is chronic severe tortuosity of the cervical right ICA. 2.  Normal for age non contrast CT appearance of the brain. 3. Widespread chronic cervical spine fusion from the C3 to the C7 level. Sequelae of C7-T1 ACDF but pseudarthrosis at that level. 4. Chronic right maxillary sinusitis.     ASSESSMENT AND PLAN  75 y.o. year old female here with postural and action tremor in upper extremities for past 8-10 years, now with increasing resting tremor, bradykinesia and rigidity. 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 Also with history of cervical myelopathy and lumbar radiculopathy.  Dx: parkinson's  disease + essential tremor + h/o cervical myelopathy + h/o lumbar radiculopathy  No diagnosis found.    PLAN:  PARKINSONISM - check DATscan (to help differentiate between ET and PD) - continue carbidopa/levodopa for parkinsonism and resting tremor (1-2 tabs three times a day) - may consider rasagiline, stalevo or rytary in future -  continue PT exercises at home for balance and gait; use cane or walker as needed  ESSENTIAL TREMOR - continue propranolol '80mg'$  daily for essential tremor and BP control - add primidone '25mg'$  daily; increase up to '50mg'$  twice a day   RIGHT ICA NARROWING / STENOSIS - improved on June 2017 CTA head/neck; monitor for now  Orders Placed This Encounter  Procedures   NM BRAIN DATSCAN TUMOR LOC INFLAM SPECT 1 DAY   Meds ordered this encounter  Medications   primidone (MYSOLINE) 50 MG tablet    Sig: Take 0.5-1 tablets (25-50 mg total) by mouth 2 (two) times daily.    Dispense:  60 tablet    Refill:  5   carbidopa-levodopa (SINEMET IR) 25-100 MG tablet    Sig: Take 1-2 tablets by mouth 3 (three) times daily.    Dispense:  540 tablet    Refill:  4   Return in about 6 months (around 07/03/2021).    Penni Bombard, MD [id number], [id number] AM Certified in Neurology, Neurophysiology and Neuroimaging  [facility] [address], [place],  [address] [phone]

## 2021-01-01 ENCOUNTER — Telehealth: Payer: Self-pay | Admitting: Diagnostic Neuroimaging

## 2021-01-01 DIAGNOSIS — R21 Rash and other nonspecific skin eruption: Secondary | ICD-10-CM | POA: Diagnosis not present

## 2021-01-01 DIAGNOSIS — L501 Idiopathic urticaria: Secondary | ICD-10-CM | POA: Diagnosis not present

## 2021-01-01 DIAGNOSIS — C44629 Squamous cell carcinoma of skin of left upper limb, including shoulder: Secondary | ICD-10-CM | POA: Diagnosis not present

## 2021-01-01 DIAGNOSIS — H2512 Age-related nuclear cataract, left eye: Secondary | ICD-10-CM | POA: Diagnosis not present

## 2021-01-01 DIAGNOSIS — Z85828 Personal history of other malignant neoplasm of skin: Secondary | ICD-10-CM | POA: Diagnosis not present

## 2021-01-01 NOTE — Telephone Encounter (Signed)
Received signed patient consent form for DaTscan referral. Filled out all necessary ppw, received MD signature & faxed to [facility]. They will obtain PA if needed and provide me with the outcome. Phone: [phone]. 

## 2021-01-02 NOTE — Telephone Encounter (Signed)
NPR required for DaTscan. Case [id number]. Sent to [employer] @ [facility] to schedule.

## 2021-01-03 DIAGNOSIS — H25812 Combined forms of age-related cataract, left eye: Secondary | ICD-10-CM | POA: Diagnosis not present

## 2021-01-03 DIAGNOSIS — H2512 Age-related nuclear cataract, left eye: Secondary | ICD-10-CM | POA: Diagnosis not present

## 2021-01-03 DIAGNOSIS — H2181 Floppy iris syndrome: Secondary | ICD-10-CM | POA: Diagnosis not present

## 2021-01-08 DIAGNOSIS — E782 Mixed hyperlipidemia: Secondary | ICD-10-CM | POA: Diagnosis not present

## 2021-01-08 DIAGNOSIS — E039 Hypothyroidism, unspecified: Secondary | ICD-10-CM | POA: Diagnosis not present

## 2021-01-08 DIAGNOSIS — M5136 Other intervertebral disc degeneration, lumbar region: Secondary | ICD-10-CM | POA: Diagnosis not present

## 2021-01-08 DIAGNOSIS — I10 Essential (primary) hypertension: Secondary | ICD-10-CM | POA: Diagnosis not present

## 2021-01-09 ENCOUNTER — Other Ambulatory Visit: Payer: Self-pay | Admitting: Diagnostic Neuroimaging

## 2021-01-17 ENCOUNTER — Encounter (HOSPITAL_COMMUNITY)
Admission: RE | Admit: 2021-01-17 | Discharge: 2021-01-17 | Disposition: A | Discharge status: Home / Self Care | Payer: Medicare Other | Source: Ambulatory Visit | Attending: Diagnostic Neuroimaging | Admitting: Diagnostic Neuroimaging

## 2021-01-17 ENCOUNTER — Other Ambulatory Visit: Payer: Self-pay

## 2021-01-17 DIAGNOSIS — G20 Parkinson's disease: Secondary | ICD-10-CM | POA: Diagnosis not present

## 2021-01-17 DIAGNOSIS — G250 Essential tremor: Secondary | ICD-10-CM | POA: Insufficient documentation

## 2021-01-17 DIAGNOSIS — R2681 Unsteadiness on feet: Secondary | ICD-10-CM | POA: Diagnosis not present

## 2021-01-17 MED ORDER — POTASSIUM IODIDE (ANTIDOTE) 130 MG PO TABS
ORAL_TABLET | ORAL | Status: AC
  Filled 2021-01-17: qty 1

## 2021-01-17 MED ORDER — IOFLUPANE I 123 185 MBQ/2.5ML IV SOLN
5.0000 | Freq: Once | INTRAVENOUS | Status: AC | PRN
  Administered 2021-01-17: 5 via INTRAVENOUS
  Filled 2021-01-17: qty 5

## 2021-01-17 MED ORDER — POTASSIUM IODIDE (ANTIDOTE) 130 MG PO TABS
130.0000 mg | ORAL_TABLET | Freq: Once | ORAL | Status: AC
  Administered 2021-01-17: 130 mg via ORAL

## 2021-01-20 NOTE — Progress Notes (Signed)
DATscan is normal. Parkinson's is still possible but less likely. We can continue essential tremor treatments. May consider to wean off carb/levo in future. -VRP

## 2021-01-27 DIAGNOSIS — Z23 Encounter for immunization: Secondary | ICD-10-CM | POA: Diagnosis not present

## 2021-02-07 DIAGNOSIS — E039 Hypothyroidism, unspecified: Secondary | ICD-10-CM | POA: Diagnosis not present

## 2021-02-07 DIAGNOSIS — I10 Essential (primary) hypertension: Secondary | ICD-10-CM | POA: Diagnosis not present

## 2021-02-07 DIAGNOSIS — M5136 Other intervertebral disc degeneration, lumbar region: Secondary | ICD-10-CM | POA: Diagnosis not present

## 2021-02-07 DIAGNOSIS — E782 Mixed hyperlipidemia: Secondary | ICD-10-CM | POA: Diagnosis not present

## 2021-03-10 DIAGNOSIS — I10 Essential (primary) hypertension: Secondary | ICD-10-CM | POA: Diagnosis not present

## 2021-03-10 DIAGNOSIS — M5136 Other intervertebral disc degeneration, lumbar region: Secondary | ICD-10-CM | POA: Diagnosis not present

## 2021-03-10 DIAGNOSIS — E782 Mixed hyperlipidemia: Secondary | ICD-10-CM | POA: Diagnosis not present

## 2021-03-10 DIAGNOSIS — E039 Hypothyroidism, unspecified: Secondary | ICD-10-CM | POA: Diagnosis not present

## 2021-03-25 DIAGNOSIS — I10 Essential (primary) hypertension: Secondary | ICD-10-CM | POA: Diagnosis not present

## 2021-04-01 DIAGNOSIS — J4520 Mild intermittent asthma, uncomplicated: Secondary | ICD-10-CM | POA: Diagnosis not present

## 2021-04-01 DIAGNOSIS — E782 Mixed hyperlipidemia: Secondary | ICD-10-CM | POA: Diagnosis not present

## 2021-04-01 DIAGNOSIS — E039 Hypothyroidism, unspecified: Secondary | ICD-10-CM | POA: Diagnosis not present

## 2021-04-01 DIAGNOSIS — I10 Essential (primary) hypertension: Secondary | ICD-10-CM | POA: Diagnosis not present

## 2021-04-14 DIAGNOSIS — C44629 Squamous cell carcinoma of skin of left upper limb, including shoulder: Secondary | ICD-10-CM | POA: Diagnosis not present

## 2021-04-14 DIAGNOSIS — Z85828 Personal history of other malignant neoplasm of skin: Secondary | ICD-10-CM | POA: Diagnosis not present

## 2021-04-14 DIAGNOSIS — D485 Neoplasm of uncertain behavior of skin: Secondary | ICD-10-CM | POA: Diagnosis not present

## 2021-04-14 DIAGNOSIS — L820 Inflamed seborrheic keratosis: Secondary | ICD-10-CM | POA: Diagnosis not present

## 2021-05-07 DIAGNOSIS — M25511 Pain in right shoulder: Secondary | ICD-10-CM | POA: Diagnosis not present

## 2021-05-07 DIAGNOSIS — M542 Cervicalgia: Secondary | ICD-10-CM | POA: Diagnosis not present

## 2021-06-10 DIAGNOSIS — E039 Hypothyroidism, unspecified: Secondary | ICD-10-CM | POA: Diagnosis not present

## 2021-06-10 DIAGNOSIS — E782 Mixed hyperlipidemia: Secondary | ICD-10-CM | POA: Diagnosis not present

## 2021-06-10 DIAGNOSIS — I10 Essential (primary) hypertension: Secondary | ICD-10-CM | POA: Diagnosis not present

## 2021-06-10 DIAGNOSIS — M5136 Other intervertebral disc degeneration, lumbar region: Secondary | ICD-10-CM | POA: Diagnosis not present

## 2021-06-26 DIAGNOSIS — R0602 Shortness of breath: Secondary | ICD-10-CM | POA: Diagnosis not present

## 2021-06-26 DIAGNOSIS — J40 Bronchitis, not specified as acute or chronic: Secondary | ICD-10-CM | POA: Diagnosis not present

## 2021-06-26 DIAGNOSIS — I498 Other specified cardiac arrhythmias: Secondary | ICD-10-CM | POA: Diagnosis not present

## 2021-07-01 ENCOUNTER — Other Ambulatory Visit: Payer: Self-pay

## 2021-07-01 ENCOUNTER — Ambulatory Visit (INDEPENDENT_AMBULATORY_CARE_PROVIDER_SITE_OTHER): Payer: Medicare Other | Admitting: Diagnostic Neuroimaging

## 2021-07-01 ENCOUNTER — Encounter: Payer: Self-pay | Admitting: Diagnostic Neuroimaging

## 2021-07-01 VITALS — BP 129/76 | HR 67 | Ht 60.0 in | Wt 147.2 lb

## 2021-07-01 DIAGNOSIS — G250 Essential tremor: Secondary | ICD-10-CM | POA: Diagnosis not present

## 2021-07-01 DIAGNOSIS — G20 Parkinson's disease: Secondary | ICD-10-CM | POA: Diagnosis not present

## 2021-07-01 MED ORDER — PRIMIDONE 50 MG PO TABS: 25.0000 mg | ORAL_TABLET | Freq: Two times a day (BID) | ORAL | 5 refills | Status: DC

## 2021-07-01 MED ORDER — CARBIDOPA-LEVODOPA 25-100 MG PO TABS: 1.0000 | ORAL_TABLET | Freq: Three times a day (TID) | ORAL | 4 refills | Status: DC

## 2021-07-01 MED ORDER — PROPRANOLOL HCL ER 80 MG PO CP24: 80.0000 mg | ORAL_CAPSULE | Freq: Every day | ORAL | 3 refills | Status: DC

## 2021-07-01 NOTE — Progress Notes (Signed)
[facility]  PATIENT: Jeanette [name] DOB: 1945/07/13  REFERRING CLINICIAN: Merrilee Seashore, MD HISTORY FROM: patient REASON FOR VISIT: follow up    HISTORICAL  CHIEF COMPLAINT:  Chief Complaint  Patient presents with   Parkinsonism    Rm 6, 6 month FU "only taking carb/levo at night for RLS, tremors have gotten a  lot worse"    HISTORY OF PRESENT ILLNESS:   UPDATE (07/01/21, VRP): Since last visit, doing well, except at she reduced carb/levo on her own (inadvertantly) and now tremors worse.   UPDATE (12/31/20, VRP): Since last visit, doing about the same. Resting tremor and balance stable. Postural tremor more bothersome. Taking 1-2 tabs carb/levo; three times a day; nausea limits dosing.   UPDATE (12/26/19, VRP): Since last visit, doing well. Symptoms are slightly progressed. Severity mild-mod. No alleviating or aggravating factors. Tolerating meds. Taking carb/levo 3-4 tabs per day. Tremor slightly increased. Balance stable.  UPDATE (12/20/18, VRP): Since last visit, doing well. Symptoms are stable. Severity is mild. No alleviating or aggravating factors. Tolerating meds. Some afternoon tremors are noticeable. Has had some falls.   UPDATE (03/21/18, VRP): Since last visit, doing more head tremor issues in last 3-4 months. Symptoms are progressive and mild. No alleviating or aggravating factors. Tolerating meds.    UPDATE (04/05/17, VRP): Since last visit, doing well. Tolerating meds. No alleviating or aggravating factors. Carb/levo is helping. Patient taking twice a day.   UPDATE 09/29/16: Since last visit, doing well. Carb/levo helping tremor control. Mainly taking twice a day. No other new symptoms.  UPDATE 06/23/16: Since last visit no change in sxs. MRI reviewed. Ready to start carb/levo.   PRIOR HPI (03/11/16): 76 year old female here for evaluation of tremor. Patient is right handed and has hypertension. For past 8-10 years, patient has had intermittent  postural and action tremor. This is progressively worsened over time. More recently she has noticed subtle resting tremor. Patient is having more difficulty with holding objects, drinking from a cup, handwriting. Symptoms fluctuate from day to day. She has a glass of wine sometimes this calms down some of her tremor. Patient has been on propranolol for blood pressure control, and was advised to take an extra tablet of this to help with tremor control. However patient has not tried taking the extra propranolol. Patient has significant family history for tremor. Patient's mother had tremor but not specifically diagnosed. Patient's mother also had polycythemia vera and later leukemia. Patient's son has tremor starting at age 72 years old. Patient's maternal aunts 2 and maternal uncle 1 at Parkinson's disease. Several first cousins on her mother's side also have Parkinson's disease. A different maternal uncle had multiple sclerosis. Patient also has noted some hoarse voice, intermittent choking sensation, mild drooling from the right side of her mouth. No change in smell or taste. No significant anxiety or constipation. No leg tremor. She has some mild balance and walking difficulty.  Patient has had multiple cervical spine surgeries in 2003, 2007 in 2014. 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 She is also had lumbar spine surgeries in 2001 and 2014. Patient had some complication of surgery including hoarse voice.  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 Conventional angiogram was performed which showed tortuous right ICA without evidence of dissection.   REVIEW OF SYSTEMS: Full 14 system review of systems performed and negative with exception of: as per HPI.   ALLERGIES:  Allergies  Allergen Reactions   Compazine [Prochlorperazine Edisylate] Anaphylaxis and Swelling    Mouth and tongue  swell, throat swells closed    HOME MEDICATIONS: Outpatient Medications Prior to Visit  Medication Sig Dispense Refill   acetaminophen (TYLENOL) 500 MG tablet Take 1,000 mg by mouth daily as needed for moderate pain or headache.     albuterol (PROVENTIL HFA;VENTOLIN HFA) 108 (90 BASE) MCG/ACT inhaler Inhale 2 puffs into the lungs every 6 (six) hours as needed for wheezing.      Cholecalciferol (VITAMIN D3) 5000 units CAPS Take 5,000 Units by mouth daily.     losartan (COZAAR) 100 MG tablet Take 100 mg by mouth daily.     SYNTHROID 88 MCG tablet Take 88 mcg by mouth daily.     venlafaxine XR (EFFEXOR-XR) 37.5 MG 24 hr capsule Take 37.5 mg by mouth every evening.      carbidopa-levodopa (SINEMET IR) 25-100 MG tablet Take 1-2 tablets by mouth 3 (three) times daily. 540 tablet 4   primidone (MYSOLINE) 50 MG tablet Take 0.5-1 tablets (25-50 mg total) by mouth 2 (two) times daily. 60 tablet 5   propranolol ER (INDERAL LA) 80 MG 24 hr capsule Take 1 capsule (80 mg total) by mouth daily. 90 capsule 3   No facility-administered medications prior to visit.    PAST MEDICAL HISTORY: Past Medical History:  Diagnosis Date   Arthritis    Asthma    hx   Hypertension    Hypothyroidism    Neuromuscular disorder ([place])    tremors in hands   Parkinsonism ([place])    Pneumonia    hx   PONV (postoperative nausea and vomiting)    has scop patch to apply  10/13/12   Stroke [facility]) 2011   tia's   Thyroid disease    TIA (transient ischemic attack)     PAST SURGICAL HISTORY: Past Surgical History:  Procedure Laterality Date   ANTERIOR CERVICAL DECOMP/DISCECTOMY FUSION N/A 10/14/2012   Procedure: ANTERIOR CERVICAL DECOMPRESSION/DISCECTOMY FUSION 1 LEVEL/HARDWARE REMOVAL;  Surgeon: Kristeen Miss, MD;  Location: [facility];  Service: Neurosurgery;  Laterality: N/A;  C7-T1 Anterior cervical decompression/diskectomy/fusion/removal of old synthes plate   BACK SURGERY  01   BACK SURGERY  2019   DILATION AND  CURETTAGE OF UTERUS     HYSTEROSCOPY  2004, 2007   endometrial polyp   KNEE SURGERY Right    76 yrs old   NASAL SINUS SURGERY Right 08/2016   polyp removed   NECK SURGERY  07,03   cerv,Fusion   ROTATOR CUFF REPAIR Right 2015   TONSILLECTOMY      FAMILY HISTORY: Family History  Problem Relation Age of Onset   Leukemia Mother    Ovarian cancer Mother    Tremor Mother    Polycythemia Mother    Hypertension Father    Heart disease Father    Parkinson's disease Maternal Aunt    Stroke Maternal Aunt    Parkinson's disease Maternal Uncle    Multiple sclerosis Maternal Uncle    Colon cancer Neg Hx     SOCIAL HISTORY:  Social History   Socioeconomic History   Marital status: Married    Spouse name: [name]   Number of children: 2   Years of education: 14   Highest education level: Not on file  Occupational History    Comment: retired Insurance claims handler  Tobacco Use   Smoking status: Never   Smokeless tobacco: Never  Vaping Use   Vaping Use: Never  used  Substance and Sexual Activity   Alcohol use: Yes    Alcohol/week: 1.0 standard drink    Types: 1 Glasses of wine per week    Comment: wine 1-2 week   Drug use: No   Sexual activity: Yes    Birth control/protection: Post-menopausal    Comment: 1st intercourse 40 yo-1 partner  Other Topics Concern   Not on file  Social History Narrative   Lives with husband   Caffeine - coffee/tea, 1-2 cups daily   Social Determinants of Health   Financial Resource Strain: Not on file  Food Insecurity: Not on file  Transportation Needs: Not on file  Physical Activity: Not on file  Stress: Not on file  Social Connections: Not on file  Intimate Partner Violence: Not on file     PHYSICAL EXAM  GENERAL EXAM/CONSTITUTIONAL: Vitals:  Vitals:   07/01/21 1010  BP: 129/76  Pulse: 67  Weight: 147 lb 3.2 oz (66.8 kg)  Height: 5' (1.524 m)   Body mass index is 28.75 kg/m. No results found. Patient is in no distress; well developed,  nourished and groomed; neck is supple  CARDIOVASCULAR: Examination of carotid arteries is normal; no carotid bruits Regular rate and rhythm, no murmurs Examination of peripheral vascular system by observation and palpation is normal  EYES: Ophthalmoscopic exam of optic discs and posterior segments is normal; no papilledema or hemorrhages  MUSCULOSKELETAL: Gait, strength, tone, movements noted in Neurologic exam below  NEUROLOGIC: MENTAL STATUS:  No flowsheet data found. awake, alert, oriented to person, place and time recent and remote memory intact normal attention and concentration language fluent, comprehension intact, naming intact,  fund of knowledge appropriate  CRANIAL NERVE:  2nd - no papilledema on fundoscopic exam 2nd, 3rd, 4th, 6th - pupils equal and reactive to light, visual fields full to confrontation, extraocular muscles intact, no nystagmus 5th - facial sensation symmetric 7th - facial strength symmetric 8th - hearing intact 9th - palate elevates symmetrically, uvula midline 11th - shoulder shrug symmetric 12th - tongue protrusion midline MILD VOICE TREMOR SLIGHTLY HOARSE VOICE SLIGHT HEAD TREMOR  MOTOR:  normal bulk, full strength in the BUE, BLE NORMAL TONE IN BUE AND BLE MODERATE POSTURAL AND ACTION TREMOR IN BUE RARE RESTING TREMOR BUE AND BLE MOD BRADYKINESIA IN BUE abd BLE; (right lower than left)  SENSORY:  normal and symmetric to light touch  COORDINATION:  finger-nose-finger, fine finger movements normal  REFLEXES:  deep tendon reflexes present and symmetric TRACE AT ANKLES  GAIT/STATION:  narrow based gait; CAUTIOUS GAIT; SHORT STEPS; DECR ARM SWING; mild tremor in right hand with walking     DIAGNOSTIC DATA (LABS, IMAGING, TESTING) - I reviewed patient records, labs, notes, testing and imaging myself where available.  Lab Results  Component Value Date   WBC 15.3 (H) 01/05/2018   HGB 9.4 (L) 01/05/2018   HCT 30.4 (L)  01/05/2018   MCV 95.0 01/05/2018   PLT 288 01/05/2018      Component Value Date/Time   NA 139 01/05/2018 0311   K 4.4 01/05/2018 0311   CL 107 01/05/2018 0311   CO2 27 01/05/2018 0311   GLUCOSE 161 (H) 01/05/2018 0311   BUN 15 01/05/2018 0311   CREATININE 0.70 07/28/2018 1014   CREATININE 1.61 (H) 06/27/2015 1407   CALCIUM 8.4 (L) 01/05/2018 0311   PROT 7.1 12/30/2010 0935   ALBUMIN 3.9 12/30/2010 0935   AST 19 12/30/2010 0935   ALT 14 12/30/2010 0935   ALKPHOS 79  12/30/2010 0935   BILITOT 0.5 12/30/2010 0935   GFRNONAA >60 01/05/2018 0311   GFRAA >60 01/05/2018 0311   No results found for: CHOL, HDL, LDLCALC, LDLDIRECT, TRIG, CHOLHDL No results found for: HGBA1C No results found for: VITAMINB12 No results found for: TSH  10/01/12 MRI cervical spine [I reviewed images myself and agree with interpretation. -VRP]  - Fusion from C3-C7 has a satisfactory appearance.  Mild gliosis of the cord at the C4-5 level. - Progressive degenerative disease at C7-T1.  Advanced facet arthropathy with anterolisthesis of 5 mm.  Disc degeneration and bulging.  Effacement subarachnoid space.  Bilateral neural foraminal stenosis that could compress either or both C8 nerve roots.  This is more pronounced on the right.  February 03, 2013 MRI lumbar spine [I reviewed images myself and agree with interpretation. -VRP]  1. New broad-based disc protrusion at L1-2 with slight caudal down turning and mild to moderate mass effect on the thecal sac. 2. Stable moderate to moderately severe multifactorial spinal and bilateral lateral recess stenosis at L2-3. 3. Stable posterior and interbody fusion changes at L3-4 and L4-5.   05/28/13 CT neck  1. Focal tapering of the right internal carotid artery with decrease caliber of the distal ICA. 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 An acute dissection is also considered. 2. The vocal cords appear symmetric. An acute vascular event could be affecting function of  the will vocal cords. 3. Extensive cervical spine fusion.  05/28/13 carotid and vertebral angiogram - Angiographically no evidence of intimal flaps, intraluminal filling defects or stenosis to suggest dissection of the carotid artery systems bilaterally. - Mild prominence of the right internal jugular vein in the neck without stenoses. 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  03/20/16 MRI brain [I reviewed images myself and agree with interpretation. There is possible decreased caliber of right ICA on T1 views, but not definitely on T2 views. -VRP]  1.   The brain appears normal for age. 2.    As noted on the MRI from 02/26/2009, there is asymmetry of the ICA at the skull base, with right caliber smaller than the left. This was evaluated on CT angiography 05/28/2013 confirming the reduced caliber.  Consider follow-up evaluation if clinically indicated. 3.    Chronic inflammatory changes involving the right maxillary sinus and adjacent nasopharynx. Although likely due to inflammatory changes, consider dedicated sinus imaging to evaluate to determine if there is erosion of the bones that could imply a more pathologic process. 4.    Degenerative changes at C2-C3 with apparent mild spinal stenosis.  10/13/16 CTA head / neck  1. Negative head and neck CTA aside from arterial tortuosity. Minimal intra- and extracranial atherosclerosis with no stenosis identified. The right ICA is mildly non-dominant due to dominance of the left ACA A1 segment, and there is chronic severe tortuosity of the cervical right ICA. 2.  Normal for age non contrast CT appearance of the brain. 3. Widespread chronic cervical spine fusion from the C3 to the C7 level. Sequelae of C7-T1 ACDF but pseudarthrosis at that level. 4. Chronic right maxillary sinusitis.  01/17/21 DATscan Normal Ioflupane scan. No reduced radiotracer activity in basal ganglia to suggest Parkinson's syndrome  pathology.    ASSESSMENT AND PLAN  76 y.o. year old female here with postural and action tremor in upper extremities for past 8-10 years, now with increasing resting tremor, bradykinesia and rigidity. 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 Also with history  of cervical myelopathy and lumbar radiculopathy.  Dx: parkinson's disease + essential tremor + h/o cervical myelopathy + h/o lumbar radiculopathy  1. Parkinsonism, unspecified Parkinsonism type ([place])   2. Essential tremor       PLAN:  PARKINSONISM - continue carbidopa/levodopa for parkinsonism and resting tremor (1-2 tabs three times a day) - may consider rasagiline, stalevo or rytary in future - continue PT exercises at home for balance and gait; use cane or walker as needed  ESSENTIAL TREMOR - continue propranolol 80mg  daily for essential tremor and BP control - primidone 50mg  twice a day   RIGHT ICA NARROWING / STENOSIS - improved on June 2017 CTA head/neck; monitor for now   Meds ordered this encounter  Medications   carbidopa-levodopa (SINEMET IR) 25-100 MG tablet    Sig: Take 1-2 tablets by mouth 3 (three) times daily.    Dispense:  540 tablet    Refill:  4   primidone (MYSOLINE) 50 MG tablet    Sig: Take 0.5-1 tablets (25-50 mg total) by mouth 2 (two) times daily.    Dispense:  60 tablet    Refill:  5   propranolol ER (INDERAL LA) 80 MG 24 hr capsule    Sig: Take 1 capsule (80 mg total) by mouth daily.    Dispense:  90 capsule    Refill:  3   Return in about 6 months (around 12/29/2021) for MyChart visit (15 min).    Penni Bombard, MD 2/57/4935, 52:17 AM Certified in Neurology, Neurophysiology and Neuroimaging  [facility] [address] [place], [place] [address] [phone]

## 2021-07-07 DIAGNOSIS — I10 Essential (primary) hypertension: Secondary | ICD-10-CM | POA: Diagnosis not present

## 2021-07-07 DIAGNOSIS — E039 Hypothyroidism, unspecified: Secondary | ICD-10-CM | POA: Diagnosis not present

## 2021-07-07 DIAGNOSIS — E782 Mixed hyperlipidemia: Secondary | ICD-10-CM | POA: Diagnosis not present

## 2021-07-07 DIAGNOSIS — M5136 Other intervertebral disc degeneration, lumbar region: Secondary | ICD-10-CM | POA: Diagnosis not present

## 2021-07-08 DIAGNOSIS — R0602 Shortness of breath: Secondary | ICD-10-CM | POA: Diagnosis not present

## 2021-07-08 DIAGNOSIS — I10 Essential (primary) hypertension: Secondary | ICD-10-CM | POA: Diagnosis not present

## 2021-07-08 DIAGNOSIS — J370 Chronic laryngitis: Secondary | ICD-10-CM | POA: Diagnosis not present

## 2021-07-08 DIAGNOSIS — I498 Other specified cardiac arrhythmias: Secondary | ICD-10-CM | POA: Diagnosis not present

## 2021-07-08 DIAGNOSIS — J40 Bronchitis, not specified as acute or chronic: Secondary | ICD-10-CM | POA: Diagnosis not present

## 2021-07-29 DIAGNOSIS — L738 Other specified follicular disorders: Secondary | ICD-10-CM | POA: Diagnosis not present

## 2021-07-29 DIAGNOSIS — Z85828 Personal history of other malignant neoplasm of skin: Secondary | ICD-10-CM | POA: Diagnosis not present

## 2021-07-29 DIAGNOSIS — D485 Neoplasm of uncertain behavior of skin: Secondary | ICD-10-CM | POA: Diagnosis not present

## 2021-07-29 DIAGNOSIS — L570 Actinic keratosis: Secondary | ICD-10-CM | POA: Diagnosis not present

## 2021-07-29 DIAGNOSIS — C44729 Squamous cell carcinoma of skin of left lower limb, including hip: Secondary | ICD-10-CM | POA: Diagnosis not present

## 2021-08-08 DIAGNOSIS — E782 Mixed hyperlipidemia: Secondary | ICD-10-CM | POA: Diagnosis not present

## 2021-08-08 DIAGNOSIS — M5136 Other intervertebral disc degeneration, lumbar region: Secondary | ICD-10-CM | POA: Diagnosis not present

## 2021-08-08 DIAGNOSIS — E039 Hypothyroidism, unspecified: Secondary | ICD-10-CM | POA: Diagnosis not present

## 2021-08-08 DIAGNOSIS — I10 Essential (primary) hypertension: Secondary | ICD-10-CM | POA: Diagnosis not present

## 2021-08-25 DIAGNOSIS — M545 Low back pain, unspecified: Secondary | ICD-10-CM | POA: Diagnosis not present

## 2021-08-25 DIAGNOSIS — M25551 Pain in right hip: Secondary | ICD-10-CM | POA: Diagnosis not present

## 2021-08-25 DIAGNOSIS — M25552 Pain in left hip: Secondary | ICD-10-CM | POA: Diagnosis not present

## 2021-09-10 DIAGNOSIS — M25552 Pain in left hip: Secondary | ICD-10-CM | POA: Diagnosis not present

## 2021-09-13 DIAGNOSIS — M545 Low back pain, unspecified: Secondary | ICD-10-CM | POA: Diagnosis not present

## 2021-09-22 DIAGNOSIS — H43813 Vitreous degeneration, bilateral: Secondary | ICD-10-CM | POA: Diagnosis not present

## 2021-09-29 DIAGNOSIS — M48062 Spinal stenosis, lumbar region with neurogenic claudication: Secondary | ICD-10-CM | POA: Diagnosis not present

## 2021-10-01 ENCOUNTER — Other Ambulatory Visit: Payer: Self-pay | Admitting: Neurological Surgery

## 2021-10-13 NOTE — Pre-Procedure Instructions (Signed)
Surgical Instructions    Your procedure is scheduled on Tuesday 10/21/21.   Report to [facility] Main Entrance "A" at 05:30 A.M., then check in with the Admitting office.  Call this number if you have problems the morning of surgery:  [phone]   If you have any questions prior to your surgery date call [phone]: Open Monday-Friday 8am-4pm    Remember:  Do not eat after midnight the night before your surgery  You may drink clear liquids until 04:30 A.M. the morning of your surgery.   Clear liquids allowed are: Water, Non-Citrus Juices (without pulp), Carbonated Beverages, Clear Tea, Black Coffee ONLY (NO MILK, CREAM OR POWDERED CREAMER of any kind), and Gatorade    Take these medicines the morning of surgery with A SIP OF WATER:   carbidopa-levodopa (SINEMET IR)   primidone (MYSOLINE)  propranolol ER (INDERAL LA)  SYNTHROID   Take these medicines if needed:   acetaminophen (TYLENOL)   albuterol (PROVENTIL HFA;VENTOLIN HFA)  traMADol (ULTRAM)    As of today, STOP taking any Aspirin (unless otherwise instructed by your surgeon) Aleve, Naproxen, Ibuprofen, Motrin, Advil, Goody's, BC's, all herbal medications, fish oil, and all vitamins.           Do not wear jewelry or makeup Do not wear lotions, powders, perfumes/colognes, or deodorant. Do not shave 48 hours prior to surgery.  Men may shave face and neck. Do not bring valuables to the hospital. Do not wear nail polish, gel polish, artificial nails, or any other type of covering on natural nails (fingers and toes) If you have artificial nails or gel coating that need to be removed by a nail salon, please have this removed prior to surgery. Artificial nails or gel coating may interfere with anesthesia's ability to adequately monitor your vital signs.  [facility] is not responsible for any belongings or valuables. .   Do NOT Smoke (Tobacco/Vaping)  24 hours prior to your procedure  If you use a CPAP at night, you may  bring your mask for your overnight stay.   Contacts, glasses, hearing aids, dentures or partials may not be worn into surgery, please bring cases for these belongings   For patients admitted to the hospital, discharge time will be determined by your treatment team.   Patients discharged the day of surgery will not be allowed to drive home, and someone needs to stay with them for 24 hours.   SURGICAL WAITING ROOM VISITATION Patients having surgery or a procedure in a hospital may have two support people. Children under the age of 17 must have an adult with them who is not the patient. They may stay in the waiting area during the procedure and may switch out with other visitors. If the patient needs to stay at the hospital during part of their recovery, the visitor guidelines for inpatient rooms apply.  Please refer to the [facility] website for the visitor guidelines for Inpatients (after your surgery is over and you are in a regular room).       Special instructions:    Oral Hygiene is also important to reduce your risk of infection.  Remember - BRUSH YOUR TEETH THE MORNING OF SURGERY WITH YOUR REGULAR TOOTHPASTE   [facility]- Preparing For Surgery  Before surgery, you can play an important role. Because skin is not sterile, your skin needs to be as free of germs as possible. You can reduce the number of germs on your skin by washing with CHG (chlorahexidine gluconate) Soap  before surgery.  CHG is an antiseptic cleaner which kills germs and bonds with the skin to continue killing germs even after washing.     Please do not use if you have an allergy to CHG or antibacterial soaps. If your skin becomes reddened/irritated stop using the CHG.  Do not shave (including legs and underarms) for at least 48 hours prior to first CHG shower. It is OK to shave your face.  Please follow these instructions carefully.     Shower the NIGHT BEFORE SURGERY and the MORNING OF SURGERY with CHG Soap.    If you chose to wash your hair, wash your hair first as usual with your normal shampoo. After you shampoo, rinse your hair and body thoroughly to remove the shampoo.  Then [employer] and genitals (private parts) with your normal soap and rinse thoroughly to remove soap.  After that Use CHG Soap as you would any other liquid soap. You can apply CHG directly to the skin and wash gently with a scrungie or a clean washcloth.   Apply the CHG Soap to your body ONLY FROM THE NECK DOWN.  Do not use on open wounds or open sores. Avoid contact with your eyes, ears, mouth and genitals (private parts). Wash Face and genitals (private parts)  with your normal soap.   Wash thoroughly, paying special attention to the area where your surgery will be performed.  Thoroughly rinse your body with warm water from the neck down.  DO NOT shower/wash with your normal soap after using and rinsing off the CHG Soap.  Pat yourself dry with a CLEAN TOWEL.  Wear CLEAN PAJAMAS to bed the night before surgery  Place CLEAN SHEETS on your bed the night before your surgery  DO NOT SLEEP WITH PETS.   Day of Surgery:  Take a shower with CHG soap. Wear Clean/Comfortable clothing the morning of surgery Do not apply any deodorants/lotions.   Remember to brush your teeth WITH YOUR REGULAR TOOTHPASTE.    If you received a COVID test during your pre-op visit, it is requested that you wear a mask when out in public, stay away from anyone that may not be feeling well, and notify your surgeon if you develop symptoms. If you have been in contact with anyone that has tested positive in the last 10 days, please notify your surgeon.    Please read over the following fact sheets that you were given.

## 2021-10-14 ENCOUNTER — Encounter (HOSPITAL_COMMUNITY)
Admission: RE | Admit: 2021-10-14 | Discharge: 2021-10-14 | Disposition: A | Discharge status: Home / Self Care | Payer: Medicare Other | Source: Ambulatory Visit | Attending: Neurological Surgery | Admitting: Neurological Surgery

## 2021-10-14 ENCOUNTER — Other Ambulatory Visit: Payer: Self-pay

## 2021-10-14 ENCOUNTER — Encounter (HOSPITAL_COMMUNITY): Payer: Self-pay

## 2021-10-14 VITALS — BP 138/89 | Temp 98.3°F | Resp 18 | Ht 60.0 in | Wt 139.7 lb

## 2021-10-14 DIAGNOSIS — Z01818 Encounter for other preprocedural examination: Secondary | ICD-10-CM | POA: Insufficient documentation

## 2021-10-14 LAB — BASIC METABOLIC PANEL
Anion gap: 8 (ref 5–15)
BUN: 14 mg/dL (ref 8–23)
CO2: 27 mmol/L (ref 22–32)
Calcium: 9.7 mg/dL (ref 8.9–10.3)
Chloride: 105 mmol/L (ref 98–111)
Creatinine, Ser: 0.91 mg/dL (ref 0.44–1.00)
GFR, Estimated: >60 mL/min (ref >60)
Glucose, Bld: 101 mg/dL — ABNORMAL HIGH (ref 70–99)
Potassium: 3.7 mmol/L (ref 3.5–5.1)
Sodium: 140 mmol/L (ref 135–145)

## 2021-10-14 LAB — CBC
HCT: 41.4 % (ref 36.0–46.0)
Hemoglobin: 12.9 g/dL (ref 12.0–15.0)
MCH: 30.3 pg (ref 26.0–34.0)
MCHC: 31.2 g/dL (ref 30.0–36.0)
MCV: 97.2 fL (ref 80.0–100.0)
Platelets: 252 10*3/uL (ref 150–400)
RBC: 4.26 MIL/uL (ref 3.87–5.11)
RDW: 13 % (ref 11.5–15.5)
WBC: 10.2 10*3/uL (ref 4.0–10.5)
nRBC: 0 % (ref 0.0–0.2)

## 2021-10-14 LAB — TYPE AND SCREEN
ABO/RH(D): A NEG
Antibody Screen: NEGATIVE

## 2021-10-14 LAB — SURGICAL PCR SCREEN
MRSA, PCR: NEGATIVE
Staphylococcus aureus: NEGATIVE

## 2021-10-14 NOTE — Progress Notes (Signed)
PCP - Dr. Merrilee Seashore Cardiologist - denies  PPM/ICD - n/a Device Orders - n/a Rep Notified - n/a  Chest x-ray - n/a EKG - 10/14/21 Stress Test - denies ECHO - denies Cardiac Cath - denies  Sleep Study - denies CPAP - n/a  Fasting Blood Sugar - n/a Checks Blood Sugar _____ times a day- n/a  Blood Thinner Instructions: n/a Aspirin Instructions: n/a  ERAS Protcol - Clear liquids until 0430 am day of surgery PRE-SURGERY Ensure or G2- None ordered  COVID TEST- n/a   Anesthesia review: Yes. EKG Review.  Patient denies shortness of breath, fever, cough and chest pain at PAT appointment   All instructions explained to the patient, with a verbal understanding of the material. Patient agrees to go over the instructions while at home for a better understanding. The opportunity to ask questions was provided.

## 2021-10-21 ENCOUNTER — Ambulatory Visit (HOSPITAL_COMMUNITY): Payer: Medicare Other

## 2021-10-21 ENCOUNTER — Observation Stay (HOSPITAL_COMMUNITY)
Admission: AD | Admit: 2021-10-21 | Discharge: 2021-10-22 | Disposition: A | Discharge status: Home / Self Care | Payer: Medicare Other | Attending: Neurological Surgery | Admitting: Neurological Surgery

## 2021-10-21 ENCOUNTER — Ambulatory Visit (HOSPITAL_COMMUNITY)
Admission: AD | Disposition: A | Discharge status: Home / Self Care | Payer: Self-pay | Source: Home / Self Care | Attending: Neurological Surgery

## 2021-10-21 ENCOUNTER — Ambulatory Visit (HOSPITAL_COMMUNITY): Payer: Medicare Other | Admitting: Physician Assistant

## 2021-10-21 ENCOUNTER — Encounter (HOSPITAL_COMMUNITY): Payer: Self-pay | Admitting: Neurological Surgery

## 2021-10-21 ENCOUNTER — Other Ambulatory Visit: Payer: Self-pay

## 2021-10-21 ENCOUNTER — Ambulatory Visit (HOSPITAL_BASED_OUTPATIENT_CLINIC_OR_DEPARTMENT_OTHER): Payer: Medicare Other | Admitting: Certified Registered"

## 2021-10-21 DIAGNOSIS — M4805 Spinal stenosis, thoracolumbar region: Secondary | ICD-10-CM | POA: Diagnosis not present

## 2021-10-21 DIAGNOSIS — M4326 Fusion of spine, lumbar region: Secondary | ICD-10-CM | POA: Diagnosis not present

## 2021-10-21 DIAGNOSIS — Z981 Arthrodesis status: Secondary | ICD-10-CM | POA: Diagnosis not present

## 2021-10-21 DIAGNOSIS — Z8616 Personal history of COVID-19: Secondary | ICD-10-CM | POA: Diagnosis not present

## 2021-10-21 DIAGNOSIS — M48062 Spinal stenosis, lumbar region with neurogenic claudication: Secondary | ICD-10-CM | POA: Insufficient documentation

## 2021-10-21 DIAGNOSIS — M4726 Other spondylosis with radiculopathy, lumbar region: Secondary | ICD-10-CM | POA: Diagnosis not present

## 2021-10-21 DIAGNOSIS — M5416 Radiculopathy, lumbar region: Secondary | ICD-10-CM | POA: Insufficient documentation

## 2021-10-21 DIAGNOSIS — M47815 Spondylosis without myelopathy or radiculopathy, thoracolumbar region: Secondary | ICD-10-CM | POA: Diagnosis not present

## 2021-10-21 SURGERY — POSTERIOR LUMBAR FUSION 1 LEVEL
Anesthesia: General | Site: Back

## 2021-10-21 MED ORDER — PROPOFOL 10 MG/ML IV BOLUS
INTRAVENOUS | Status: DC | PRN
  Administered 2021-10-21: 70 mg via INTRAVENOUS
  Administered 2021-10-21: 20 mg via INTRAVENOUS

## 2021-10-21 MED ORDER — OXYCODONE HCL 5 MG PO TABS: 5.0000 mg | ORAL_TABLET | Freq: Once | ORAL | Status: DC | PRN

## 2021-10-21 MED ORDER — DOCUSATE SODIUM 100 MG PO CAPS
100.0000 mg | ORAL_CAPSULE | Freq: Two times a day (BID) | ORAL | Status: DC
  Administered 2021-10-21 – 2021-10-22 (×2): 100 mg via ORAL
  Filled 2021-10-21 (×2): qty 1

## 2021-10-21 MED ORDER — SUFENTANIL CITRATE 50 MCG/ML IV SOLN
INTRAVENOUS | Status: DC | PRN
  Administered 2021-10-21 (×3): 10 ug via INTRAVENOUS
  Administered 2021-10-21 (×2): 5 ug via INTRAVENOUS
  Administered 2021-10-21: 10 ug via INTRAVENOUS

## 2021-10-21 MED ORDER — THROMBIN 5000 UNITS EX SOLR
OROMUCOSAL | Status: DC | PRN
  Administered 2021-10-21 (×2): 5 mL via TOPICAL

## 2021-10-21 MED ORDER — THROMBIN 5000 UNITS EX SOLR
CUTANEOUS | Status: AC
  Filled 2021-10-21: qty 5000

## 2021-10-21 MED ORDER — PHENYLEPHRINE HCL-NACL 20-0.9 MG/250ML-% IV SOLN
INTRAVENOUS | Status: DC | PRN
  Administered 2021-10-21: 20 ug/min via INTRAVENOUS

## 2021-10-21 MED ORDER — PROPOFOL 500 MG/50ML IV EMUL
INTRAVENOUS | Status: DC | PRN
  Administered 2021-10-21: 80 ug/kg/min via INTRAVENOUS

## 2021-10-21 MED ORDER — BUPIVACAINE HCL (PF) 0.5 % IJ SOLN
INTRAMUSCULAR | Status: DC | PRN
  Administered 2021-10-21: 10 mL
  Administered 2021-10-21: 20 mL

## 2021-10-21 MED ORDER — OXYCODONE-ACETAMINOPHEN 5-325 MG PO TABS
1.0000 | ORAL_TABLET | ORAL | Status: DC | PRN
  Administered 2021-10-21 (×3): 2 via ORAL
  Administered 2021-10-22: 1 via ORAL
  Administered 2021-10-22: 2 via ORAL
  Filled 2021-10-21: qty 2
  Filled 2021-10-21: qty 1
  Filled 2021-10-21 (×3): qty 2

## 2021-10-21 MED ORDER — PRIMIDONE 50 MG PO TABS
50.0000 mg | ORAL_TABLET | Freq: Two times a day (BID) | ORAL | Status: DC
  Administered 2021-10-21 – 2021-10-22 (×2): 50 mg via ORAL
  Filled 2021-10-21 (×3): qty 1

## 2021-10-21 MED ORDER — MORPHINE SULFATE (PF) 2 MG/ML IV SOLN: 2.0000 mg | INTRAVENOUS | Status: DC | PRN

## 2021-10-21 MED ORDER — SODIUM CHLORIDE 0.9% FLUSH
3.0000 mL | Freq: Two times a day (BID) | INTRAVENOUS | Status: DC
  Administered 2021-10-21: 3 mL via INTRAVENOUS

## 2021-10-21 MED ORDER — LIDOCAINE-EPINEPHRINE 1 %-1:100000 IJ SOLN
INTRAMUSCULAR | Status: AC
  Filled 2021-10-21: qty 1

## 2021-10-21 MED ORDER — CEFAZOLIN SODIUM-DEXTROSE 2-4 GM/100ML-% IV SOLN
INTRAVENOUS | Status: AC
  Filled 2021-10-21: qty 100

## 2021-10-21 MED ORDER — POLYETHYLENE GLYCOL 3350 17 G PO PACK: 17.0000 g | PACK | Freq: Every day | ORAL | Status: DC | PRN

## 2021-10-21 MED ORDER — SENNA 8.6 MG PO TABS
1.0000 | ORAL_TABLET | Freq: Two times a day (BID) | ORAL | Status: DC
  Administered 2021-10-21 – 2021-10-22 (×2): 8.6 mg via ORAL
  Filled 2021-10-21 (×2): qty 1

## 2021-10-21 MED ORDER — CHLORHEXIDINE GLUCONATE 0.12 % MT SOLN: 15.0000 mL | Freq: Once | OROMUCOSAL | Status: AC

## 2021-10-21 MED ORDER — LACTATED RINGERS IV SOLN: INTRAVENOUS | Status: DC

## 2021-10-21 MED ORDER — ONDANSETRON HCL 4 MG/2ML IJ SOLN
INTRAMUSCULAR | Status: AC
  Filled 2021-10-21: qty 2

## 2021-10-21 MED ORDER — METHOCARBAMOL 1000 MG/10ML IJ SOLN
500.0000 mg | Freq: Four times a day (QID) | INTRAVENOUS | Status: DC | PRN
  Filled 2021-10-21: qty 5

## 2021-10-21 MED ORDER — CARBIDOPA-LEVODOPA 25-100 MG PO TABS
2.0000 | ORAL_TABLET | Freq: Two times a day (BID) | ORAL | Status: DC
  Administered 2021-10-21 – 2021-10-22 (×2): 2 via ORAL
  Filled 2021-10-21 (×2): qty 2

## 2021-10-21 MED ORDER — PROPOFOL 1000 MG/100ML IV EMUL
INTRAVENOUS | Status: AC
  Filled 2021-10-21: qty 100

## 2021-10-21 MED ORDER — CHLORHEXIDINE GLUCONATE CLOTH 2 % EX PADS: 6.0000 | MEDICATED_PAD | Freq: Once | CUTANEOUS | Status: DC

## 2021-10-21 MED ORDER — SCOPOLAMINE 1 MG/3DAYS TD PT72
MEDICATED_PATCH | TRANSDERMAL | Status: AC
  Filled 2021-10-21: qty 1

## 2021-10-21 MED ORDER — ACETAMINOPHEN 650 MG RE SUPP: 650.0000 mg | RECTAL | Status: DC | PRN

## 2021-10-21 MED ORDER — OXYCODONE HCL 5 MG/5ML PO SOLN: 5.0000 mg | Freq: Once | ORAL | Status: DC | PRN

## 2021-10-21 MED ORDER — ONDANSETRON HCL 4 MG/2ML IJ SOLN: 4.0000 mg | Freq: Four times a day (QID) | INTRAMUSCULAR | Status: DC | PRN

## 2021-10-21 MED ORDER — PROPRANOLOL HCL ER 80 MG PO CP24
80.0000 mg | ORAL_CAPSULE | Freq: Every day | ORAL | Status: DC
  Administered 2021-10-22: 80 mg via ORAL
  Filled 2021-10-21: qty 1

## 2021-10-21 MED ORDER — FLEET ENEMA 7-19 GM/118ML RE ENEM: 1.0000 | ENEMA | Freq: Once | RECTAL | Status: DC | PRN

## 2021-10-21 MED ORDER — LACTATED RINGERS IV SOLN: INTRAVENOUS | Status: DC | PRN

## 2021-10-21 MED ORDER — 0.9 % SODIUM CHLORIDE (POUR BTL) OPTIME
TOPICAL | Status: DC | PRN
  Administered 2021-10-21: 1000 mL

## 2021-10-21 MED ORDER — ACETAMINOPHEN 325 MG PO TABS: 650.0000 mg | ORAL_TABLET | ORAL | Status: DC | PRN

## 2021-10-21 MED ORDER — PHENOL 1.4 % MT LIQD: 1.0000 | OROMUCOSAL | Status: DC | PRN

## 2021-10-21 MED ORDER — ALBUMIN HUMAN 5 % IV SOLN
25.0000 g | Freq: Once | INTRAVENOUS | Status: AC
  Administered 2021-10-21: 25 g via INTRAVENOUS

## 2021-10-21 MED ORDER — CARBIDOPA-LEVODOPA 25-100 MG PO TABS: 1.0000 | ORAL_TABLET | Freq: Three times a day (TID) | ORAL | Status: DC

## 2021-10-21 MED ORDER — ALBUMIN HUMAN 5 % IV SOLN
INTRAVENOUS | Status: AC
  Filled 2021-10-21: qty 250

## 2021-10-21 MED ORDER — CHLORHEXIDINE GLUCONATE 0.12 % MT SOLN
OROMUCOSAL | Status: AC
  Administered 2021-10-21: 15 mL via OROMUCOSAL
  Filled 2021-10-21: qty 15

## 2021-10-21 MED ORDER — TRAMADOL HCL 50 MG PO TABS: 50.0000 mg | ORAL_TABLET | Freq: Four times a day (QID) | ORAL | Status: DC | PRN

## 2021-10-21 MED ORDER — FENTANYL CITRATE (PF) 100 MCG/2ML IJ SOLN
25.0000 ug | INTRAMUSCULAR | Status: DC | PRN
  Administered 2021-10-21: 50 ug via INTRAVENOUS
  Administered 2021-10-21: 25 ug via INTRAVENOUS

## 2021-10-21 MED ORDER — SCOPOLAMINE 1 MG/3DAYS TD PT72
MEDICATED_PATCH | TRANSDERMAL | Status: DC | PRN
  Administered 2021-10-21: 1 via TRANSDERMAL

## 2021-10-21 MED ORDER — SUFENTANIL CITRATE 50 MCG/ML IV SOLN
INTRAVENOUS | Status: AC
  Filled 2021-10-21: qty 1

## 2021-10-21 MED ORDER — ONDANSETRON HCL 4 MG/2ML IJ SOLN
INTRAMUSCULAR | Status: DC | PRN
  Administered 2021-10-21: 4 mg via INTRAVENOUS

## 2021-10-21 MED ORDER — ALBUTEROL SULFATE HFA 108 (90 BASE) MCG/ACT IN AERS: 2.0000 | INHALATION_SPRAY | Freq: Four times a day (QID) | RESPIRATORY_TRACT | Status: DC | PRN

## 2021-10-21 MED ORDER — CEFAZOLIN SODIUM-DEXTROSE 2-4 GM/100ML-% IV SOLN
2.0000 g | INTRAVENOUS | Status: AC
  Administered 2021-10-21: 2 g via INTRAVENOUS

## 2021-10-21 MED ORDER — METHOCARBAMOL 500 MG PO TABS
500.0000 mg | ORAL_TABLET | Freq: Four times a day (QID) | ORAL | Status: DC | PRN
  Administered 2021-10-21 (×2): 500 mg via ORAL
  Filled 2021-10-21 (×2): qty 1

## 2021-10-21 MED ORDER — SODIUM CHLORIDE 0.9 % IV SOLN: 250.0000 mL | INTRAVENOUS | Status: DC

## 2021-10-21 MED ORDER — CARBIDOPA-LEVODOPA 25-100 MG PO TABS: 1.0000 | ORAL_TABLET | Freq: Every day | ORAL | Status: DC | PRN

## 2021-10-21 MED ORDER — ROCURONIUM BROMIDE 10 MG/ML (PF) SYRINGE
PREFILLED_SYRINGE | INTRAVENOUS | Status: DC | PRN
  Administered 2021-10-21: 40 mg via INTRAVENOUS
  Administered 2021-10-21: 60 mg via INTRAVENOUS
  Administered 2021-10-21: 50 mg via INTRAVENOUS

## 2021-10-21 MED ORDER — DEXAMETHASONE SODIUM PHOSPHATE 10 MG/ML IJ SOLN
INTRAMUSCULAR | Status: DC | PRN
  Administered 2021-10-21: 10 mg via INTRAVENOUS

## 2021-10-21 MED ORDER — CEFAZOLIN SODIUM-DEXTROSE 2-4 GM/100ML-% IV SOLN
2.0000 g | Freq: Three times a day (TID) | INTRAVENOUS | Status: AC
  Administered 2021-10-21 (×2): 2 g via INTRAVENOUS
  Filled 2021-10-21 (×2): qty 100

## 2021-10-21 MED ORDER — PROPOFOL 10 MG/ML IV BOLUS
INTRAVENOUS | Status: AC
  Filled 2021-10-21: qty 20

## 2021-10-21 MED ORDER — ONDANSETRON HCL 4 MG PO TABS: 4.0000 mg | ORAL_TABLET | Freq: Four times a day (QID) | ORAL | Status: DC | PRN

## 2021-10-21 MED ORDER — LEVOTHYROXINE SODIUM 88 MCG PO TABS
88.0000 ug | ORAL_TABLET | Freq: Every day | ORAL | Status: DC
  Administered 2021-10-22: 88 ug via ORAL
  Filled 2021-10-21: qty 1

## 2021-10-21 MED ORDER — LOSARTAN POTASSIUM 50 MG PO TABS
100.0000 mg | ORAL_TABLET | Freq: Every day | ORAL | Status: DC
  Administered 2021-10-22: 100 mg via ORAL
  Filled 2021-10-21: qty 2

## 2021-10-21 MED ORDER — BISACODYL 10 MG RE SUPP: 10.0000 mg | Freq: Every day | RECTAL | Status: DC | PRN

## 2021-10-21 MED ORDER — FENTANYL CITRATE (PF) 100 MCG/2ML IJ SOLN
INTRAMUSCULAR | Status: AC
  Filled 2021-10-21: qty 2

## 2021-10-21 MED ORDER — SUGAMMADEX SODIUM 200 MG/2ML IV SOLN
INTRAVENOUS | Status: DC | PRN
  Administered 2021-10-21: 200 mg via INTRAVENOUS

## 2021-10-21 MED ORDER — DEXAMETHASONE SODIUM PHOSPHATE 10 MG/ML IJ SOLN
INTRAMUSCULAR | Status: AC
  Filled 2021-10-21: qty 1

## 2021-10-21 MED ORDER — HEMOSTATIC AGENTS (NO CHARGE) OPTIME
TOPICAL | Status: DC | PRN
  Administered 2021-10-21: 1 via TOPICAL

## 2021-10-21 MED ORDER — SODIUM CHLORIDE 0.9% FLUSH: 3.0000 mL | INTRAVENOUS | Status: DC | PRN

## 2021-10-21 MED ORDER — THROMBIN 20000 UNITS EX SOLR
CUTANEOUS | Status: DC | PRN
  Administered 2021-10-21: 20 mL via TOPICAL

## 2021-10-21 MED ORDER — LIDOCAINE 2% (20 MG/ML) 5 ML SYRINGE
INTRAMUSCULAR | Status: DC | PRN
  Administered 2021-10-21: 60 mg via INTRAVENOUS

## 2021-10-21 MED ORDER — BUPIVACAINE HCL (PF) 0.5 % IJ SOLN
INTRAMUSCULAR | Status: AC
  Filled 2021-10-21: qty 30

## 2021-10-21 MED ORDER — LIDOCAINE-EPINEPHRINE 1 %-1:100000 IJ SOLN
INTRAMUSCULAR | Status: DC | PRN
  Administered 2021-10-21: 10 mL

## 2021-10-21 MED ORDER — MENTHOL 3 MG MT LOZG: 1.0000 | LOZENGE | OROMUCOSAL | Status: DC | PRN

## 2021-10-21 MED ORDER — ORAL CARE MOUTH RINSE: 15.0000 mL | Freq: Once | OROMUCOSAL | Status: AC

## 2021-10-21 MED ORDER — ALUM & MAG HYDROXIDE-SIMETH 200-200-20 MG/5ML PO SUSP: 30.0000 mL | Freq: Four times a day (QID) | ORAL | Status: DC | PRN

## 2021-10-21 MED ORDER — VENLAFAXINE HCL ER 37.5 MG PO CP24
37.5000 mg | ORAL_CAPSULE | Freq: Every evening | ORAL | Status: DC
  Administered 2021-10-21: 37.5 mg via ORAL
  Filled 2021-10-21 (×2): qty 1

## 2021-10-21 MED ORDER — THROMBIN 20000 UNITS EX SOLR
CUTANEOUS | Status: AC
  Filled 2021-10-21: qty 20000

## 2021-10-21 SURGICAL SUPPLY — 80 items
ADH SKN CLS APL DERMABOND .7 (GAUZE/BANDAGES/DRESSINGS) ×1
APL SRG 60D 8 XTD TIP BNDBL (TIP)
BAG COUNTER SPONGE SURGICOUNT (BAG) ×3 IMPLANT
BAG SPNG CNTER NS LX DISP (BAG) ×2
BASKET BONE COLLECTION (BASKET) ×2 IMPLANT
BLADE BONE MILL FINE (MISCELLANEOUS) ×2
BLADE BONE MILL MEDIUM (MISCELLANEOUS) ×1 IMPLANT
BLADE CLIPPER SURG (BLADE) IMPLANT
BLADE MILL BN FN STRL DISP (MISCELLANEOUS) IMPLANT
BONE CANC CHIPS 20CC PCAN1/4 (Bone Implant) ×2 IMPLANT
BUR MATCHSTICK NEURO 3.0 LAGG (BURR) ×2 IMPLANT
CAGE COROENT MP 8X23 (Cage) ×2 IMPLANT
CANISTER SUCT 3000ML PPV (MISCELLANEOUS) ×2 IMPLANT
CEMENT KYPHON C01A KIT/MIXER (Cement) ×1 IMPLANT
CHIPS CANC BONE 20CC PCAN1/4 (Bone Implant) ×1 IMPLANT
CNTNR URN SCR LID CUP LEK RST (MISCELLANEOUS) ×1 IMPLANT
CONT SPEC 4OZ STRL OR WHT (MISCELLANEOUS) ×2
COVER BACK TABLE 60X90IN (DRAPES) ×2 IMPLANT
DERMABOND ADVANCED (GAUZE/BANDAGES/DRESSINGS) ×1
DERMABOND ADVANCED .7 DNX12 (GAUZE/BANDAGES/DRESSINGS) ×1 IMPLANT
DEVICE DISSECT PLASMABLAD 3.0S (MISCELLANEOUS) ×1 IMPLANT
DRAPE C-ARM 42X72 X-RAY (DRAPES) ×4 IMPLANT
DRAPE HALF SHEET 40X57 (DRAPES) IMPLANT
DRAPE LAPAROTOMY 100X72X124 (DRAPES) ×2 IMPLANT
DRAPE WARM FLUID 44X44 (DRAPES) ×1 IMPLANT
DRSG OPSITE POSTOP 4X8 (GAUZE/BANDAGES/DRESSINGS) ×1 IMPLANT
DURAPREP 26ML APPLICATOR (WOUND CARE) ×2 IMPLANT
DURASEAL APPLICATOR TIP (TIP) IMPLANT
DURASEAL SPINE SEALANT 3ML (MISCELLANEOUS) IMPLANT
ELECT REM PT RETURN 9FT ADLT (ELECTROSURGICAL) ×2
ELECTRODE REM PT RTRN 9FT ADLT (ELECTROSURGICAL) ×1 IMPLANT
GAUZE 4X4 16PLY ~~LOC~~+RFID DBL (SPONGE) IMPLANT
GAUZE SPONGE 4X4 12PLY STRL (GAUZE/BANDAGES/DRESSINGS) ×2 IMPLANT
GLOVE BIOGEL PI IND STRL 8.5 (GLOVE) ×2 IMPLANT
GLOVE BIOGEL PI INDICATOR 8.5 (GLOVE) ×2
GLOVE ECLIPSE 8.5 STRL (GLOVE) ×4 IMPLANT
GOWN STRL REUS W/ TWL LRG LVL3 (GOWN DISPOSABLE) IMPLANT
GOWN STRL REUS W/ TWL XL LVL3 (GOWN DISPOSABLE) IMPLANT
GOWN STRL REUS W/TWL 2XL LVL3 (GOWN DISPOSABLE) ×4 IMPLANT
GOWN STRL REUS W/TWL LRG LVL3 (GOWN DISPOSABLE)
GOWN STRL REUS W/TWL XL LVL3 (GOWN DISPOSABLE)
GRAFT BNE CANC CHIPS 1-8 20CC (Bone Implant) IMPLANT
GRAFT BONE PROTEIOS LRG 5CC (Orthopedic Implant) ×1 IMPLANT
HEMOSTAT POWDER KIT SURGIFOAM (HEMOSTASIS) ×2 IMPLANT
KIT BASIN OR (CUSTOM PROCEDURE TRAY) ×2 IMPLANT
KIT GRAFTMAG DEL NEURO DISP (NEUROSURGERY SUPPLIES) IMPLANT
KIT TURNOVER KIT B (KITS) ×2 IMPLANT
MILL BONE PREP (MISCELLANEOUS) ×2 IMPLANT
NDL RELINE-OR FENS 16 (NEEDLE) IMPLANT
NEEDLE HYPO 22GX1.5 SAFETY (NEEDLE) ×2 IMPLANT
NEEDLE RELINE-OR FENS 16 (NEEDLE) ×4 IMPLANT
NS IRRIG 1000ML POUR BTL (IV SOLUTION) ×3 IMPLANT
PACK LAMINECTOMY NEURO (CUSTOM PROCEDURE TRAY) ×2 IMPLANT
PAD ARMBOARD 7.5X6 YLW CONV (MISCELLANEOUS) ×6 IMPLANT
PATTIES SURGICAL .5 X1 (DISPOSABLE) ×2 IMPLANT
PLASMABLADE 3.0S (MISCELLANEOUS) ×2
PUSHER RELINE FENS 36 OR (MISCELLANEOUS) ×2 IMPLANT
ROD RELINE 0-0 CON M 5.0/6.0MM (Rod) ×2 IMPLANT
ROD RELINE TI LATERAL MED OFF (Rod) ×1 IMPLANT
ROD RELINE-O LORDOTIC 5.5X60MM (Rod) ×1 IMPLANT
ROD TEMPLATE RELINE SL (MISCELLANEOUS) ×1 IMPLANT
SCREW LOCK RELINE 5.5 TULIP (Screw) ×6 IMPLANT
SCREW RELINE PA 2FC 6.5X50 (Screw) ×2 IMPLANT
SEALANT ADHERUS EXTEND TIP (MISCELLANEOUS) ×1 IMPLANT
SPIKE FLUID TRANSFER (MISCELLANEOUS) ×1 IMPLANT
SPONGE SURGIFOAM ABS GEL 100 (HEMOSTASIS) ×2 IMPLANT
SPONGE T-LAP 4X18 ~~LOC~~+RFID (SPONGE) IMPLANT
SUT PROLENE 6 0 BV (SUTURE) IMPLANT
SUT VIC AB 1 CT1 18XBRD ANBCTR (SUTURE) ×1 IMPLANT
SUT VIC AB 1 CT1 8-18 (SUTURE) ×2
SUT VIC AB 2-0 CP2 18 (SUTURE) ×2 IMPLANT
SUT VIC AB 3-0 SH 8-18 (SUTURE) ×2 IMPLANT
SUT VIC AB 4-0 RB1 18 (SUTURE) ×2 IMPLANT
SYR 3ML LL SCALE MARK (SYRINGE) ×8 IMPLANT
SYR 5ML LL (SYRINGE) ×1 IMPLANT
TIP FENS REPLACE RELINE (MISCELLANEOUS) ×2 IMPLANT
TOWEL GREEN STERILE (TOWEL DISPOSABLE) ×2 IMPLANT
TOWEL GREEN STERILE FF (TOWEL DISPOSABLE) ×2 IMPLANT
TRAY FOLEY MTR SLVR 16FR STAT (SET/KITS/TRAYS/PACK) ×2 IMPLANT
WATER STERILE IRR 1000ML POUR (IV SOLUTION) ×2 IMPLANT

## 2021-10-21 NOTE — Anesthesia Procedure Notes (Addendum)
Procedure Name: Intubation Date/Time: 10/21/2021 8:00 AM  Performed by: Claris Che, CRNAPre-anesthesia Checklist: Patient identified, Emergency Drugs available, Suction available and Patient being monitored Patient Re-evaluated:Patient Re-evaluated prior to induction Oxygen Delivery Method: Circle System Utilized Preoxygenation: Pre-oxygenation with 100% oxygen Induction Type: IV induction Ventilation: Mask ventilation without difficulty Laryngoscope Size: Mac and 3 Grade View: Grade II Tube type: Oral Tube size: 7.0 mm Number of attempts: 1 Airway Equipment and Method: Stylet and Oral airway Placement Confirmation: ETT inserted through vocal cords under direct vision, positive ETCO2 and breath sounds checked- equal and bilateral Secured at: 21 cm Tube secured with: Tape Dental Injury: Teeth and Oropharynx as per pre-operative assessment

## 2021-10-21 NOTE — Transfer of Care (Signed)
Immediate Anesthesia Transfer of Care Note  Patient: Jeanette [name]  Procedure(s) Performed: Thoracic Twelve- Lumbar One Posterior Lumbar Interbody Fusion with methacrylate augmentation (Back)  Patient Location: PACU  Anesthesia Type:General  Level of Consciousness: drowsy, patient cooperative and responds to stimulation  Airway & Oxygen Therapy: Patient Spontanous Breathing and Patient connected to face mask oxygen  Post-op Assessment: Report given to RN, Post -op Vital signs reviewed and stable and Patient moving all extremities X 4  Post vital signs: Reviewed and stable  Last Vitals:  Vitals Value Taken Time  BP 85/55 10/21/21 1152  Temp    Pulse 53 10/21/21 1154  Resp 20 10/21/21 1155  SpO2 98 % 10/21/21 1154  Vitals shown include unvalidated device data.  Last Pain:  Vitals:   10/21/21 0637  TempSrc:   PainSc: 0-No pain         Complications: No notable events documented.

## 2021-10-21 NOTE — Anesthesia Preprocedure Evaluation (Signed)
Anesthesia Evaluation  Patient identified by MRN, date of birth, ID band Patient awake    Reviewed: Allergy & Precautions, NPO status , Patient's Chart, lab work & pertinent test results  History of Anesthesia Complications (+) PONV and history of anesthetic complications  Airway Mallampati: IV  TM Distance: <3 FB Neck ROM: Full    Dental  (+) Dental Advisory Given, Teeth Intact   Pulmonary neg shortness of breath, asthma , neg sleep apnea, neg recent URI,    breath sounds clear to auscultation       Cardiovascular hypertension, Pt. on medications (-) angina(-) Past MI and (-) CHF (-) dysrhythmias  Rhythm:Regular - Systolic murmurs    Neuro/Psych Tremor--> sinemet TIA Neuromuscular disease negative psych ROS   GI/Hepatic negative GI ROS, Neg liver ROS,   Endo/Other  Hypothyroidism   Renal/GU negative Renal ROSLab Results      Component                Value               Date                      CREATININE               0.91                10/14/2021                Musculoskeletal  (+) Arthritis ,   Abdominal   Peds  Hematology negative hematology ROS (+) Lab Results      Component                Value               Date                      WBC                      10.2                10/14/2021                HGB                      12.9                10/14/2021                HCT                      41.4                10/14/2021                MCV                      97.2                10/14/2021                PLT                      252                 10/14/2021              Anesthesia  Other Findings   Reproductive/Obstetrics                             Anesthesia Physical Anesthesia Plan  ASA: 2  Anesthesia Plan: General   Post-op Pain Management: Ofirmev IV (intra-op)*   Induction: Intravenous  PONV Risk Score and Plan: 4 or greater and Ondansetron,  Dexamethasone, Propofol infusion and TIVA  Airway Management Planned: Oral ETT and Video Laryngoscope Planned  Additional Equipment: None  Intra-op Plan:   Post-operative Plan: Extubation in OR  Informed Consent: I have reviewed the patients History and Physical, chart, labs and discussed the procedure including the risks, benefits and alternatives for the proposed anesthesia with the patient or authorized representative who has indicated his/her understanding and acceptance.     Dental advisory given  Plan Discussed with: CRNA  Anesthesia Plan Comments:         Anesthesia Quick Evaluation

## 2021-10-21 NOTE — Op Note (Signed)
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  History of fusion L1-L5. Postoperative diagnosis: Same Procedure: Posterior decompression T12-L1 with posterior lumbar interbody arthrodesis T12-L1 using peek spacers local autograft allograft and Proteus.  Posterolateral arthrodesis with local autograft allograft and Proteus T12-L1.  Adequate screw fixation T12 to previous construct L1-L5.  Fluoroscopic image guidance.  Methacrylate augmentation of T12 pedicle screws. Surgeon: Kristeen Miss Anesthesia: General endotracheal Indications: Jeanette [name] is a 76 year old individual who has had extensive surgery in the past from L1-L5 with decompression and stabilization.  She developed adjacent level disease with retrolisthesis and severe spondylitic stenosis and some posterior disc herniation.  She was advised regarding the need for surgical decompression and stabilization at the T12-L1 level.  She is also advised about the possibility that the extension may need to ultimately be extended across the thoracolumbar junction to the level of T10.  Procedure: The patient was brought to the operating room supine on the stretcher after the smooth induction of general endotracheal anesthesia she was carefully turned prone.  The back was prepped with alcohol DuraPrep and draped in a sterile fashion.  Previous incision was opened on the superior aspect to expose the top of the rod and the pedicle screws at the L1 vertebrae.  Radiograph confirmed the positioning and then the dissection was carried cephalad to expose the next the disc space at T12-L1.  Care was taken to isolate this area and remove all the soft tissues from the interlaminar space at T12-L1.  Pedicle entry sites were also chosen at T12 at this time and a small drill hole was created in the chosen pedicle entry site.  Then on the rod below the L1 screw and area  was cleared to allow placement of a W connector for an adjacent rod.  This was done bilaterally and a W connector was secured to the rod that was present.  Then a laminectomy was created bilaterally removing the inferior margin lamina of T12 out to the entirety of the facet at T12-L1 this was removed and then the underlying ligamentous material was noted to be very thickened and redundant creating the significant portion of the stenosis this was carefully dissected on both sides and good decompression of the common dural tube was obtained from the lateral aspects of the common dural tube were then carefully dissected the space was identified and entered and on the right side there is a small ventral dural laceration that occurred with some spinal fluid protruding through it.  This was carefully tamponaded with some Surgifoam and cottonoid patties.  The discectomy was then performed on both sides removing the entirety of the disc at the T12-L1 level the endplates were curettaged to allow good placement of bone graft that would ultimately heal across this interspace.  Then the interspace was sized and it was felt that a 4 degree 8 mm tall by 23 mm long spacer would fit best into this interval.  The endplates were then thoroughly prepared for the placement of the bone graft and the spacers and ultimately 6 cc of bone graft along with the 2 peek spacers were placed into the interspace.  Lateral gutters were then decorticated between the transverse processes of L1 and the rib articulation of T12 6 cc of bone graft were packed into each lateral gutter.  Pedicle entry sites were then verified fluoroscopically and 6.5 x 50 mm screws were placed into the pedicles at T12.  These  were then augmented with 1 cc of methacrylate placed under fluoroscopic guidance methacrylate was well-placed.  Then on the left side it was felt that a straight 60 mm precontoured rod would fit well and this required some additional contouring  because of the severe hyperlordosis in this region on the right side as the rod was created with an appropriate amount of lordotic contouring to fit between the T12 saddles and the W attachment.  This was then tightened in a neutral construct.  Care was taken to make sure that the common dural tube and the individual nerve roots were well decompressed in addition on the right side no spinal fluid leakage was noted however this was reinforced with some adherous biologic cement.  Hemostasis in the soft tissue was then verified and the lumbodorsal fascia was closed with #1 Vicryl in interrupted fashion 2-0 Vicryl was used in the subcutaneous tissues 3-0 Vicryl subcuticularly along with 4-0 Vicryl and the final subcuticular layer Dermabond was placed on the skin.  Blood loss for the procedure was estimated 400 cc the patient tolerated procedure well was returned to recovery room

## 2021-10-21 NOTE — Progress Notes (Signed)
Orthopedic Tech Progress Note Patient Details:  Rayya Yagi Castelli June 21, 1945 [id number]  Patient stated " she has her back brcae'  Patient ID: Anitra Lauth, female   DOB: 10-01-1945, 76 y.o.   MRN: [id number]  Janit Pagan 10/21/2021, 2:12 PM

## 2021-10-21 NOTE — H&P (Signed)
  CHIEF COMPLAINT: Increasing back pain, right hip and buttock pain.  HISTORY OF PRESENT ILLNESS: Jeanette [name] is a patient of mine whom I have seen for a number of years with degenerative process that started at the lumbosacral junction and gradually worked its way up to the level that it involves L1 down to the sacrum.  Her last surgery was back in 2019 and she was recovering from that and when [place] hit, she notes that she did fairly well, but did not have any further follow-ups. Since March or April of this year, she started to develop significant pain around the right buttock and hip.  She believes that some over work doing yard work may have brought this process on, but it has been persisting and worsening in the sense that it does not alleviate itself with any given position.  The pain is nearly constant in the buttock and hip and makes walking and getting around quite difficult for her.  PHYSICAL EXAMINATION: I note that she walks with a moderate antalgia involving that right leg.  She demonstrates good strength in the iliopsoas and the quad, but she does have a modest bit of weakness in the tibialis anterior.  The gastroc strength appears intact.  She does tend to favor a slight forward pitch in her gait in order to maintain some level of comfort.     Today in the office to further her workup, I obtained a lateral flexion-extension film plus a singular AP view.  The radiographs demonstrate that at the T12-L1 level, Jeanette [name] has a 10 mm retrolisthesis of T12 on L1.  The MRI that she had done a while back demonstrates that she has a broad-based bulge of the disc with some moderate disc protrusion eccentric to the right more so than the left.  This would involve mostly the T12 and L1 nerve roots, but it can involve several of the nerve roots in the lateral recess on that right side.  IMPRESSION: Jeanette [name] has adjacent level disease at T12-L1.  I demonstrated the findings on the x-ray and the MRI to her  and her daughter and noted that the stenosis that she has in that right lateral recess is likely aggravating this radicular pain. Prior to me seeing Jeanette [name], she had been having some treatment with Dr. Percell Miller downstairs, and she had some oral steroids plus some steroid shots in her back.  Previously, when we did epidural steroids, Jeanette [name] never really got good relief and she did best when she had the surgical decompression and stabilizations.  I noted to [facility] that this process is now reaching the thoracolumbar junction. It is not uncommon when things reach this level that stabilization is performed up to at least the level of T10.  However, given the stability of those joints above, I have suggested to [place] that we may just be best served to do an adequate decompression at the T12-L1 level and stabilization of T12-L1 without disrupting any of the facets or the joints above that.  This would hopefully give her good relief of the worst of the symptoms and allow her a quicker recovery.  I did note that I would consider doing some methacrylate augmentation to the screws in the T12 vertebrae in an effort to gain some stability there.  We can plan on scheduling the surgery at the earliest convenience for her.

## 2021-10-22 DIAGNOSIS — M48062 Spinal stenosis, lumbar region with neurogenic claudication: Secondary | ICD-10-CM | POA: Diagnosis not present

## 2021-10-22 DIAGNOSIS — Z8616 Personal history of COVID-19: Secondary | ICD-10-CM | POA: Diagnosis not present

## 2021-10-22 DIAGNOSIS — M47815 Spondylosis without myelopathy or radiculopathy, thoracolumbar region: Secondary | ICD-10-CM | POA: Diagnosis not present

## 2021-10-22 DIAGNOSIS — M5416 Radiculopathy, lumbar region: Secondary | ICD-10-CM | POA: Diagnosis not present

## 2021-10-22 DIAGNOSIS — Z981 Arthrodesis status: Secondary | ICD-10-CM | POA: Diagnosis not present

## 2021-10-22 DIAGNOSIS — M4805 Spinal stenosis, thoracolumbar region: Secondary | ICD-10-CM | POA: Diagnosis not present

## 2021-10-22 LAB — BASIC METABOLIC PANEL
Anion gap: 8 (ref 5–15)
BUN: 11 mg/dL (ref 8–23)
CO2: 27 mmol/L (ref 22–32)
Calcium: 9.3 mg/dL (ref 8.9–10.3)
Chloride: 105 mmol/L (ref 98–111)
Creatinine, Ser: 0.93 mg/dL (ref 0.44–1.00)
GFR, Estimated: >60 mL/min (ref >60)
Glucose, Bld: 120 mg/dL — ABNORMAL HIGH (ref 70–99)
Potassium: 4.5 mmol/L (ref 3.5–5.1)
Sodium: 140 mmol/L (ref 135–145)

## 2021-10-22 LAB — CBC
HCT: 32.6 % — ABNORMAL LOW (ref 36.0–46.0)
Hemoglobin: 10.5 g/dL — ABNORMAL LOW (ref 12.0–15.0)
MCH: 31.5 pg (ref 26.0–34.0)
MCHC: 32.2 g/dL (ref 30.0–36.0)
MCV: 97.9 fL (ref 80.0–100.0)
Platelets: 237 10*3/uL (ref 150–400)
RBC: 3.33 MIL/uL — ABNORMAL LOW (ref 3.87–5.11)
RDW: 13.5 % (ref 11.5–15.5)
WBC: 18.6 10*3/uL — ABNORMAL HIGH (ref 4.0–10.5)
nRBC: 0 % (ref 0.0–0.2)

## 2021-10-22 MED ORDER — METHOCARBAMOL 500 MG PO TABS
500.0000 mg | ORAL_TABLET | Freq: Four times a day (QID) | ORAL | 3 refills | Status: DC | PRN
Start: 2021-10-22 — End: 2022-12-22

## 2021-10-22 MED ORDER — DEXAMETHASONE 1 MG PO TABS: ORAL_TABLET | ORAL | 0 refills | Status: DC

## 2021-10-22 MED ORDER — OXYCODONE-ACETAMINOPHEN 5-325 MG PO TABS: 1.0000 | ORAL_TABLET | ORAL | 0 refills | Status: DC | PRN

## 2021-10-22 MED FILL — Thrombin For Soln 5000 Unit: CUTANEOUS | Qty: 5000 | Status: AC

## 2021-10-22 NOTE — Evaluation (Signed)
Physical Therapy Evaluation & Discharge Patient Details Name: Jeanette [name] MRN: [id number] DOB: 02-03-1946 Today's Date: 10/22/2021  History of Present Illness  Pt is a 76 y/o female s/p elective posterior lumbar interbody arthrodesis T12-L1, screw fixation T12- L1 on 10/21/21. PMH includes arthritis, HTN, parkinsonism, PNA, CVA, TIA, asthma, rotator cuff repair, prior ACDF and back sx.   Clinical Impression  Patient evaluated by Physical Therapy with no further acute PT needs identified. PTA, pt independent, lives with husband; supportive children and church community available to assist upon discharge. Today, pt moving well with RW at supervision-level. Reviewed educ re: precautions, positioning, brace wear, activity recommendations. All education has been completed and the patient has no further questions. Acute PT is signing off. Thank you for this referral.     Recommendations for follow up therapy are one component of a multi-disciplinary discharge planning process, led by the attending physician.  Recommendations may be updated based on patient status, additional functional criteria and insurance authorization.  Follow Up Recommendations No PT follow up    Assistance Recommended at Discharge Intermittent Supervision/Assistance  Patient can return home with the following  A little help with bathing/dressing/bathroom;Assistance with cooking/housework;Assist for transportation;Help with stairs or ramp for entrance    Equipment Recommendations None recommended by PT  Recommendations for Other Services   Occupational Therapy          Precautions / Restrictions Precautions Precautions: Back;Fall Precaution Booklet Issued: Yes (comment) Required Braces or Orthoses: Spinal Brace Spinal Brace: Applied in sitting position;Lumbar corset      Mobility  Bed Mobility Overal bed mobility: Independent             General bed mobility comments: educ on log roll technique; able to  perform supine<>sit from flat bed    Transfers Overall transfer level: Needs assistance Equipment used: None Transfers: Sit to/from Stand Sit to Stand: Supervision                Ambulation/Gait Ambulation/Gait assistance: Supervision Gait Distance (Feet): 400 Feet Assistive device: Rolling walker (2 wheels) Gait Pattern/deviations: Step-through pattern, Decreased stride length Gait velocity: Decreased     General Gait Details: pt requesting use of RW for added stability; slow, steady gait with RW and supervision  Stairs Stairs: Yes Stairs assistance: Supervision Stair Management: One rail Right, Step to pattern, Forwards Number of Stairs: 4 General stair comments: ascend/descend steps with single UE rail support; supervision for safety  Wheelchair Mobility    Modified Rankin (Stroke Patients Only)       Balance Overall balance assessment: Needs assistance   Sitting balance-Leahy Scale: Good Sitting balance - Comments: able to don brace without physical assist, cues for placement/straps   Standing balance support: No upper extremity supported, During functional activity Standing balance-Leahy Scale: Fair Standing balance comment: can static stand without UE support; static and dynamic stability improved with RW                             Pertinent Vitals/Pain Pain Assessment Pain Assessment: Faces Faces Pain Scale: Hurts a little bit Pain Location: back Pain Descriptors / Indicators: Sore Pain Intervention(s): Monitored during session    Home Living Family/patient expects to be discharged to:: Private residence Living Arrangements: Spouse/significant other Available Help at Discharge: Family;Available PRN/intermittently Type of Home: House Home Access: Stairs to enter Entrance Stairs-Rails: Right Entrance Stairs-Number of Steps: 4   Home Layout: Two level;Able to live on main level  with bedroom/bathroom Home Equipment: Rolling Walker  (2 wheels);Shower seat;BSC/3in1;Grab bars - tub/shower;Grab bars - toilet Additional Comments: lives with husband who recently had knee sx; daughter nearby, son visiting this week from [place] to assist as needed. supportive church community providing meals    Prior Function Prior Level of Function : Independent/Modified Independent             Mobility Comments: typically independent without DME; has two dogs; retired Anim[employer]        Extremity/Trunk Assessment   Upper Extremity Assessment Upper Extremity Assessment: Overall WFL for tasks assessed (BUE tremors, note h/o Parkinsonism)    Lower Extremity Assessment Lower Extremity Assessment: Generalized weakness    Cervical / Trunk Assessment Cervical / Trunk Assessment: Back Surgery  Communication   Communication: No difficulties  Cognition Arousal/Alertness: Awake/alert Behavior During Therapy: WFL for tasks assessed/performed Overall Cognitive Status: Within Functional Limits for tasks assessed                                          General Comments General comments (skin integrity, edema, etc.): pt's son present and supportive    Exercises     Assessment/Plan    PT Assessment Patient does not need any further PT services  PT Problem List         PT Treatment Interventions      PT Goals (Current goals can be found in the Care Plan section)  Acute Rehab PT Goals Patient Stated Goal: home today PT Goal Formulation: All assessment and education complete, DC therapy    Frequency       Co-evaluation               AM-PAC PT "6 Clicks" Mobility  Outcome Measure Help needed turning from your back to your side while in a flat bed without using bedrails?: None Help needed moving from lying on your back to sitting on the side of a flat bed without using bedrails?: None Help needed moving to and from a bed to a chair (including a wheelchair)?: None Help  needed standing up from a chair using your arms (e.g., wheelchair or bedside chair)?: None Help needed to walk in hospital room?: A Little Help needed climbing 3-5 steps with a railing? : A Little 6 Click Score: 22    End of Session Equipment Utilized During Treatment: Back brace Activity Tolerance: Patient tolerated treatment well Patient left: in bed;with call bell/phone within reach;with family/visitor present Nurse Communication: Mobility status PT Visit Diagnosis: Other abnormalities of gait and mobility (R26.89)    Time: 2330-0762 PT Time Calculation (min) (ACUTE ONLY): 18 min   Charges:   PT Evaluation $PT Eval Low Complexity: 1 Low        Mabeline Caras, PT, DPT Acute Rehabilitation Services  Pager [phone] Office [phone]  Derry Lory 10/22/2021, 8:58 AM

## 2021-10-22 NOTE — Plan of Care (Signed)
Pt doing well. Pt and family given D/C instructions with verbal understanding. Rx's were sent to the pharmacy by MD. Pt's incision is clean and dry with no sign of infection. Pt's IV was removed prior to D/C. Pt D/C'd home via wheelchair per MD order. Pt is stable @ D/C and has no other needs at this time. Miken Stecher, RN  

## 2021-10-22 NOTE — Care Management CC44 (Signed)
Condition Code 44 Documentation Completed  Patient Details  Name: Jeanette [name] MRN: [id number] Date of Birth: [birth year]   Condition Code 44 given:  Yes Patient signature on Condition Code 44 notice:  Yes Documentation of 2 MD's agreement:  Yes Code 44 added to claim:  Yes    Pollie Friar, RN 10/22/2021, 10:07 AM

## 2021-10-22 NOTE — Anesthesia Postprocedure Evaluation (Signed)
Anesthesia Post Note  Patient: Jeanette [name]  Procedure(s) Performed: Thoracic Twelve- Lumbar One Posterior Lumbar Interbody Fusion with methacrylate augmentation (Back)     Patient location during evaluation: PACU Anesthesia Type: General Level of consciousness: awake and alert Pain management: pain level controlled Vital Signs Assessment: post-procedure vital signs reviewed and stable Respiratory status: spontaneous breathing, nonlabored ventilation, respiratory function stable and patient connected to nasal cannula oxygen Cardiovascular status: blood pressure returned to baseline and stable Postop Assessment: no apparent nausea or vomiting Anesthetic complications: no   No notable events documented.  Last Vitals:  Vitals:   10/22/21 0408 10/22/21 0741  BP: 93/64 (!) 101/59  Pulse: (!) 59 (!) 54  Resp: 18 18  Temp: 36.9 C 36.6 C  SpO2: 93% 94%    Last Pain:  Vitals:   10/22/21 0930  TempSrc:   PainSc: 6                  [name]

## 2021-10-22 NOTE — Evaluation (Signed)
Occupational Therapy Evaluation Patient Details Name: Jeanette [name] MRN: [id number] DOB: 1946/03/05 Today's Date: 10/22/2021   History of Present Illness Pt is a 77 y/o female s/p elective posterior lumbar interbody arthrodesis T12-L1, screw fixation T12- L1 on 10/21/21. PMH includes arthritis, HTN, parkinsonism, PNA, CVA, TIA, asthma, rotator cuff repair, prior ACDF and back sx.   Clinical Impression   PTA patient independent and driving. Admitted for above and presents with back precautions, pain and decreased activity tolerance.  She was educated on back precautions, ADL compensatory techniques, AE/DME, and recommendations.  Pt completing ADLs with supervision after education on techniques and use of AE.  Patient has good support at home, no DME needed.  Plans to self purchase reacher and long sponge.  No further OT needs identified and OT will sign off.       Recommendations for follow up therapy are one component of a multi-disciplinary discharge planning process, led by the attending physician.  Recommendations may be updated based on patient status, additional functional criteria and insurance authorization.   Follow Up Recommendations  No OT follow up    Assistance Recommended at Discharge PRN  Patient can return home with the following A little help with bathing/dressing/bathroom;Assist for transportation;Assistance with cooking/housework    Functional Status Assessment  Patient has had a recent decline in their functional status and demonstrates the ability to make significant improvements in function in a reasonable and predictable amount of time.  Equipment Recommendations  Other (comment) (AE- reacher, long sponge)    Recommendations for Other Services       Precautions / Restrictions Precautions Precautions: Back;Fall Precaution Booklet Issued: Yes (comment) Precaution Comments: reviewed with pt, good recall and adherance Required Braces or Orthoses: Spinal  Brace Spinal Brace: Applied in sitting position;Lumbar corset Restrictions Weight Bearing Restrictions: No      Mobility Bed Mobility Overal bed mobility: Modified Independent             General bed mobility comments: good recall of log roll technique    Transfers                          Balance Overall balance assessment: Needs assistance   Sitting balance-Leahy Scale: Good     Standing balance support: No upper extremity supported, During functional activity, Bilateral upper extremity supported Standing balance-Leahy Scale: Fair Standing balance comment: able to stand without UE support but relies on RW dynamically                           ADL either performed or assessed with clinical judgement   ADL Overall ADL's : Needs assistance/impaired     Grooming: Supervision/safety;Standing       Lower Body Bathing: Supervison/ safety;Sit to/from stand;Cueing for compensatory techniques   Upper Body Dressing : Sitting;Supervision/safety Upper Body Dressing Details (indicate cue type and reason): cueing for brace mgmt Lower Body Dressing: Supervision/safety;Sit to/from stand;Cueing for back precautions;Cueing for compensatory techniques   Toilet Transfer: Supervision/safety;Ambulation;Rolling walker (2 wheels)       Tub/ Shower Transfer: Supervision/safety;Walk-in shower;Ambulation;Grab bars   Functional mobility during ADLs: Supervision/safety;Rolling walker (2 wheels) General ADL Comments: pt demonstrating ability to compelte ADLs with supervision after education on compensatory techniques for back precaution adherance     Vision   Vision Assessment?: No apparent visual deficits     Perception     Praxis      Pertinent Vitals/Pain Pain  Assessment Pain Assessment: Faces Faces Pain Scale: Hurts a little bit Pain Location: back Pain Descriptors / Indicators: Sore Pain Intervention(s): Monitored during session, Repositioned      Hand Dominance Right   Extremity/Trunk Assessment Upper Extremity Assessment Upper Extremity Assessment: Overall WFL for tasks assessed (BUE tremors, hx of parkinsonism)   Lower Extremity Assessment Lower Extremity Assessment: Defer to PT evaluation   Cervical / Trunk Assessment Cervical / Trunk Assessment: Back Surgery   Communication Communication Communication: No difficulties   Cognition Arousal/Alertness: Awake/alert Behavior During Therapy: WFL for tasks assessed/performed Overall Cognitive Status: Within Functional Limits for tasks assessed                                       General Comments  son present and supportive    Exercises     Shoulder Instructions      Home Living Family/patient expects to be discharged to:: Private residence Living Arrangements: Spouse/significant other Available Help at Discharge: Family;Available PRN/intermittently Type of Home: House Home Access: Stairs to enter [employer] of Steps: 4 Entrance Stairs-Rails: Right Home Layout: Two level;Able to live on main level with bedroom/bathroom     Bathroom Shower/Tub: Hospital doctor Toilet: Handicapped height     Home Equipment: Conservation officer, nature (2 wheels);Shower seat;BSC/3in1;Grab bars - tub/shower;Grab bars - toilet   Additional Comments: lives with husband who recently had knee sx; daughter nearby, son visiting this week from [place] to assist as needed. supportive church community providing meals      Prior Functioning/Environment Prior Level of Function : Independent/Modified Independent             Mobility Comments: independent ADLs Comments: retired, drives and indep IADLs        OT Problem List: Decreased activity tolerance;Impaired balance (sitting and/or standing);Decreased knowledge of precautions;Pain;Decreased knowledge of use of DME or AE      OT Treatment/Interventions:      OT Goals(Current goals can be  found in the care plan section) Acute Rehab OT Goals Patient Stated Goal: home OT Goal Formulation: With patient Time For Goal Achievement: 11/05/21 Potential to Achieve Goals: Good  OT Frequency:      Co-evaluation              AM-PAC OT "6 Clicks" Daily Activity     Outcome Measure Help from another person eating meals?: None Help from another person taking care of personal grooming?: A Little Help from another person toileting, which includes using toliet, bedpan, or urinal?: A Little Help from another person bathing (including washing, rinsing, drying)?: A Little Help from another person to put on and taking off regular upper body clothing?: A Little Help from another person to put on and taking off regular lower body clothing?: None 6 Click Score: 20   End of Session Equipment Utilized During Treatment: Rolling walker (2 wheels);Back brace Nurse Communication: Mobility status  Activity Tolerance: Patient tolerated treatment well Patient left: with call bell/phone within reach;with family/visitor present;Other (comment) (seated EOB)  OT Visit Diagnosis: Unsteadiness on feet (R26.81);Pain Pain - part of body:  (back)                Time: 7902-4097 OT Time Calculation (min): 14 min Charges:  OT General Charges $OT Visit: 1 Visit OT Evaluation $OT Eval Low Complexity: [place], OT Acute Rehabilitation Services Office [place]  Caitlan Chauca 10/22/2021, 9:36 AM

## 2021-10-22 NOTE — Discharge Summary (Signed)
Physician Discharge Summary  Patient ID: Jeanette [name] MRN: [id number] DOB/AGE: 13-Aug-1945 76 y.o.  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 Principal Problem:   Lumbar stenosis with neurogenic claudication   Discharged Condition: good  Hospital Course: Patient was admitted to undergo surgical decompression at T12-L1 which she tolerated well.  Consults: None  Significant Diagnostic Studies: None  Treatments: surgery: See op note  Discharge Exam: Blood pressure (!) 101/59, pulse (!) 54, temperature 97.8 F (36.6 C), temperature source Oral, resp. rate 18, height 5' (1.524 m), weight 61.2 kg, SpO2 94 %. Incision is clean and dry motor function is intact Station and gait are intact.  Disposition: Discharge disposition: 01-Home or Self Care       Discharge Instructions     Diet - low sodium heart healthy   Complete by: As directed    Discharge wound care:   Complete by: As directed    Okay to shower. Do not apply salves or appointments to incision. No heavy lifting with the upper extremities greater than 10 pounds. May resume driving when not requiring pain medication and patient feels comfortable with doing so.   Incentive spirometry RT   Complete by: As directed    Increase activity slowly   Complete by: As directed       Allergies as of 10/22/2021       Reactions   Compazine [prochlorperazine Edisylate] Anaphylaxis, Swelling   Mouth and tongue swell, throat swells closed        Medication List     TAKE these medications    acetaminophen 500 MG tablet Commonly known as: TYLENOL Take 1,000 mg by mouth daily as needed for moderate pain or headache.   albuterol 108 (90 Base) MCG/ACT inhaler Commonly known as: VENTOLIN HFA Inhale 2 puffs into the  lungs every 6 (six) hours as needed for wheezing.   carbidopa-levodopa 25-100 MG tablet Commonly known as: SINEMET IR Take 1-2 tablets by mouth 3 (three) times daily. What changed:  how much to take when to take this additional instructions   dexamethasone 1 MG tablet Commonly known as: DECADRON 2 tablets twice daily for 2 days, one tablet twice daily for 2 days, one tablet daily for 2 days.   losartan 100 MG tablet Commonly known as: COZAAR Take 100 mg by mouth in the morning.   methocarbamol 500 MG tablet Commonly known as: ROBAXIN Take 1 tablet (500 mg total) by mouth every 6 (six) hours as needed for muscle spasms.   oxyCODONE-acetaminophen 5-325 MG tablet Commonly known as: PERCOCET/ROXICET Take 1-2 tablets by mouth every 4 (four) hours as needed for moderate pain or severe pain.   primidone 50 MG tablet Commonly known as: MYSOLINE Take 0.5-1 tablets (25-50 mg total) by mouth 2 (two) times daily. What changed: how much to take   propranolol ER 80 MG 24 hr capsule Commonly known as: INDERAL LA Take 1 capsule (80 mg total) by mouth daily.   Synthroid 88 MCG tablet Generic drug: levothyroxine Take 88 mcg by mouth daily before breakfast.   traMADol 50 MG tablet Commonly known as: ULTRAM Take 50 mg by mouth every 6 (six) hours as needed for severe pain.   venlafaxine XR 37.5 MG 24 hr capsule Commonly known as: EFFEXOR-XR Take 37.5 mg by mouth every evening.   Vitamin D3 125 MCG (5000  UT) Caps Take 5,000 Units by mouth daily.               Discharge Care Instructions  (From admission, onward)           Start     Ordered   10/22/21 0000  Discharge wound care:       Comments: Okay to shower. Do not apply salves or appointments to incision. No heavy lifting with the upper extremities greater than 10 pounds. May resume driving when not requiring pain medication and patient feels comfortable with doing so.   10/22/21 0905              Signed: Blanchie Dessert Icker Swigert 10/22/2021, 9:06 AM

## 2021-10-22 NOTE — Care Management Obs Status (Signed)
[facility] NOTIFICATION   Patient Details  Name: Jeanette [name] MRN: [id number] Date of Birth: 1946/01/16   Medicare Observation Status Notification Given:  Yes    Pollie Friar, RN 10/22/2021, 10:07 AM

## 2021-10-23 MED FILL — Sodium Chloride IV Soln 0.9%: INTRAVENOUS | Qty: 2000 | Status: AC

## 2021-10-23 MED FILL — Heparin Sodium (Porcine) Inj 1000 Unit/ML: INTRAMUSCULAR | Qty: 30 | Status: AC

## 2021-11-12 DIAGNOSIS — M48062 Spinal stenosis, lumbar region with neurogenic claudication: Secondary | ICD-10-CM | POA: Diagnosis not present

## 2021-11-18 DIAGNOSIS — L720 Epidermal cyst: Secondary | ICD-10-CM | POA: Diagnosis not present

## 2021-11-18 DIAGNOSIS — D225 Melanocytic nevi of trunk: Secondary | ICD-10-CM | POA: Diagnosis not present

## 2021-11-18 DIAGNOSIS — L570 Actinic keratosis: Secondary | ICD-10-CM | POA: Diagnosis not present

## 2021-11-18 DIAGNOSIS — D692 Other nonthrombocytopenic purpura: Secondary | ICD-10-CM | POA: Diagnosis not present

## 2021-11-18 DIAGNOSIS — Z85828 Personal history of other malignant neoplasm of skin: Secondary | ICD-10-CM | POA: Diagnosis not present

## 2021-11-18 DIAGNOSIS — L814 Other melanin hyperpigmentation: Secondary | ICD-10-CM | POA: Diagnosis not present

## 2021-11-18 DIAGNOSIS — C44729 Squamous cell carcinoma of skin of left lower limb, including hip: Secondary | ICD-10-CM | POA: Diagnosis not present

## 2021-11-18 DIAGNOSIS — D1801 Hemangioma of skin and subcutaneous tissue: Secondary | ICD-10-CM | POA: Diagnosis not present

## 2021-11-18 DIAGNOSIS — L821 Other seborrheic keratosis: Secondary | ICD-10-CM | POA: Diagnosis not present

## 2021-12-03 DIAGNOSIS — C44729 Squamous cell carcinoma of skin of left lower limb, including hip: Secondary | ICD-10-CM | POA: Diagnosis not present

## 2021-12-03 DIAGNOSIS — Z85828 Personal history of other malignant neoplasm of skin: Secondary | ICD-10-CM | POA: Diagnosis not present

## 2021-12-18 DIAGNOSIS — I10 Essential (primary) hypertension: Secondary | ICD-10-CM | POA: Diagnosis not present

## 2021-12-18 DIAGNOSIS — E782 Mixed hyperlipidemia: Secondary | ICD-10-CM | POA: Diagnosis not present

## 2021-12-18 DIAGNOSIS — J4520 Mild intermittent asthma, uncomplicated: Secondary | ICD-10-CM | POA: Diagnosis not present

## 2021-12-18 DIAGNOSIS — E039 Hypothyroidism, unspecified: Secondary | ICD-10-CM | POA: Diagnosis not present

## 2021-12-18 DIAGNOSIS — Z0000 Encounter for general adult medical examination without abnormal findings: Secondary | ICD-10-CM | POA: Diagnosis not present

## 2021-12-25 DIAGNOSIS — I10 Essential (primary) hypertension: Secondary | ICD-10-CM | POA: Diagnosis not present

## 2021-12-25 DIAGNOSIS — E039 Hypothyroidism, unspecified: Secondary | ICD-10-CM | POA: Diagnosis not present

## 2021-12-25 DIAGNOSIS — E782 Mixed hyperlipidemia: Secondary | ICD-10-CM | POA: Diagnosis not present

## 2021-12-25 DIAGNOSIS — J4520 Mild intermittent asthma, uncomplicated: Secondary | ICD-10-CM | POA: Diagnosis not present

## 2021-12-25 DIAGNOSIS — G250 Essential tremor: Secondary | ICD-10-CM | POA: Diagnosis not present

## 2021-12-30 ENCOUNTER — Encounter: Payer: Self-pay | Admitting: Diagnostic Neuroimaging

## 2021-12-30 ENCOUNTER — Telehealth (INDEPENDENT_AMBULATORY_CARE_PROVIDER_SITE_OTHER): Payer: Medicare Other | Admitting: Diagnostic Neuroimaging

## 2021-12-30 DIAGNOSIS — G20 Parkinson's disease: Secondary | ICD-10-CM

## 2021-12-30 DIAGNOSIS — G250 Essential tremor: Secondary | ICD-10-CM

## 2021-12-30 MED ORDER — PROPRANOLOL HCL ER 80 MG PO CP24
80.0000 mg | ORAL_CAPSULE | Freq: Every day | ORAL | 4 refills | Status: DC
Start: 2021-12-30 — End: 2022-10-27

## 2021-12-30 MED ORDER — CARBIDOPA-LEVODOPA 25-100 MG PO TABS
2.0000 | ORAL_TABLET | Freq: Three times a day (TID) | ORAL | 4 refills | Status: DC
Start: 2021-12-30 — End: 2022-10-27

## 2021-12-30 MED ORDER — PRIMIDONE 50 MG PO TABS
50.0000 mg | ORAL_TABLET | Freq: Two times a day (BID) | ORAL | 5 refills | Status: DC
Start: 2021-12-30 — End: 2022-09-21

## 2021-12-30 NOTE — Progress Notes (Signed)
Virtual Visit via Video Note  I connected with Jeanette [name] on 12/30/21 at  1:45 PM EDT by a video enabled telemedicine application and verified that I am speaking with the correct person using two identifiers.   I discussed the limitations of evaluation and management by telemedicine and the availability of in person appointments. The patient expressed understanding and agreed to proceed.  Patient is at their home. I am at the office.    History of Present Illness:  UPDATE (12/30/21, VRP): Since last visit, doing well. Had T12-L1 surgery in June 2023.  Parkinson symptoms are stable. No alleviating or aggravating factors. Tolerating meds.    UPDATE (07/01/21, VRP): Since last visit, doing well, except at she reduced carb/levo on her own (inadvertantly) and now tremors worse.   UPDATE (12/31/20, VRP): Since last visit, doing about the same. Resting tremor and balance stable. Postural tremor more bothersome. Taking 1-2 tabs carb/levo; three times a day; nausea limits dosing.   UPDATE (12/26/19, VRP): Since last visit, doing well. Symptoms are slightly progressed. Severity mild-mod. No alleviating or aggravating factors. Tolerating meds. Taking carb/levo 3-4 tabs per day. Tremor slightly increased. Balance stable.  UPDATE (12/20/18, VRP): Since last visit, doing well. Symptoms are stable. Severity is mild. No alleviating or aggravating factors. Tolerating meds. Some afternoon tremors are noticeable. Has had some falls.   UPDATE (03/21/18, VRP): Since last visit, doing more head tremor issues in last 3-4 months. Symptoms are progressive and mild. No alleviating or aggravating factors. Tolerating meds.    UPDATE (04/05/17, VRP): Since last visit, doing well. Tolerating meds. No alleviating or aggravating factors. Carb/levo is helping. Patient taking twice a day.   UPDATE 09/29/16: Since last visit, doing well. Carb/levo helping tremor control. Mainly taking twice a day. No other new  symptoms.  UPDATE 06/23/16: Since last visit no change in sxs. MRI reviewed. Ready to start carb/levo.   PRIOR HPI (03/11/16): 76 year old female here for evaluation of tremor. Patient is right handed and has hypertension. For past 8-10 years, patient has had intermittent postural and action tremor. This is progressively worsened over time. More recently she has noticed subtle resting tremor. Patient is having more difficulty with holding objects, drinking from a cup, handwriting. Symptoms fluctuate from day to day. She has a glass of wine sometimes this calms down some of her tremor. Patient has been on propranolol for blood pressure control, and was advised to take an extra tablet of this to help with tremor control. However patient has not tried taking the extra propranolol. Patient has significant family history for tremor. Patient's mother had tremor but not specifically diagnosed. Patient's mother also had polycythemia vera and later leukemia. Patient's son has tremor starting at age 51 years old. Patient's maternal aunts 2 and maternal uncle 1 at Parkinson's disease. Several first cousins on her mother's side also have Parkinson's disease. A different maternal uncle had multiple sclerosis. Patient also has noted some hoarse voice, intermittent choking sensation, mild drooling from the right side of her mouth. No change in smell or taste. No significant anxiety or constipation. No leg tremor. She has some mild balance and walking difficulty.  Patient has had multiple cervical spine surgeries in 2003, 2007 in 2014. 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 She is also had lumbar spine surgeries in 2001 and 2014. Patient had some complication of surgery including hoarse voice.  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 Conventional angiogram was performed which showed tortuous right ICA  without evidence of dissection.      Observations/Objective:  Postural tremor in BUE; video visit   Assessment and Plan:  Dx:  1. Parkinsonism, unspecified Parkinsonism type ([place])   2. Essential tremor      PARKINSONISM - continue carbidopa/levodopa for parkinsonism and resting tremor (try to take 2 tabs three times a day) - may consider rasagiline, stalevo or rytary in future - continue PT exercises at home for balance and gait; use cane or walker as needed   ESSENTIAL TREMOR - continue propranolol '80mg'$  daily for essential tremor and BP control - primidone '50mg'$  twice a day    RIGHT ICA NARROWING / STENOSIS - improved on June 2017 CTA head/neck; monitor for now    Follow Up Instructions:  - Return in about 1 year (around 12/31/2022) for MyChart visit (15 min).    I discussed the assessment and treatment plan with the patient. The patient was provided an opportunity to ask questions and all were answered. The patient agreed with the plan and demonstrated an understanding of the instructions.   The patient was advised to call back or seek an in-person evaluation if the symptoms worsen or if the condition fails to improve as anticipated.  I provided 15 minutes of non-face-to-face time during this encounter.   Penni Bombard, MD 11/06/6379, 7:71 PM Certified in Neurology, Neurophysiology and Neuroimaging  [facility] [address], [place], [place] [address] [phone]

## 2022-01-07 DIAGNOSIS — Z6837 Body mass index (BMI) 37.0-37.9, adult: Secondary | ICD-10-CM | POA: Diagnosis not present

## 2022-01-07 DIAGNOSIS — M48062 Spinal stenosis, lumbar region with neurogenic claudication: Secondary | ICD-10-CM | POA: Diagnosis not present

## 2022-02-08 ENCOUNTER — Other Ambulatory Visit: Payer: Self-pay | Admitting: Diagnostic Neuroimaging

## 2022-02-10 DIAGNOSIS — E782 Mixed hyperlipidemia: Secondary | ICD-10-CM | POA: Diagnosis not present

## 2022-02-10 DIAGNOSIS — I10 Essential (primary) hypertension: Secondary | ICD-10-CM | POA: Diagnosis not present

## 2022-02-17 DIAGNOSIS — J4520 Mild intermittent asthma, uncomplicated: Secondary | ICD-10-CM | POA: Diagnosis not present

## 2022-02-17 DIAGNOSIS — E039 Hypothyroidism, unspecified: Secondary | ICD-10-CM | POA: Diagnosis not present

## 2022-02-17 DIAGNOSIS — Z23 Encounter for immunization: Secondary | ICD-10-CM | POA: Diagnosis not present

## 2022-02-17 DIAGNOSIS — G250 Essential tremor: Secondary | ICD-10-CM | POA: Diagnosis not present

## 2022-02-17 DIAGNOSIS — I10 Essential (primary) hypertension: Secondary | ICD-10-CM | POA: Diagnosis not present

## 2022-02-17 DIAGNOSIS — E782 Mixed hyperlipidemia: Secondary | ICD-10-CM | POA: Diagnosis not present

## 2022-03-04 DIAGNOSIS — M25552 Pain in left hip: Secondary | ICD-10-CM | POA: Diagnosis not present

## 2022-03-04 DIAGNOSIS — M545 Low back pain, unspecified: Secondary | ICD-10-CM | POA: Diagnosis not present

## 2022-03-16 DIAGNOSIS — Z1231 Encounter for screening mammogram for malignant neoplasm of breast: Secondary | ICD-10-CM | POA: Diagnosis not present

## 2022-04-08 DIAGNOSIS — M109 Gout, unspecified: Secondary | ICD-10-CM | POA: Diagnosis not present

## 2022-07-15 DIAGNOSIS — M545 Low back pain, unspecified: Secondary | ICD-10-CM | POA: Diagnosis not present

## 2022-07-15 DIAGNOSIS — M25512 Pain in left shoulder: Secondary | ICD-10-CM | POA: Diagnosis not present

## 2022-07-15 DIAGNOSIS — M542 Cervicalgia: Secondary | ICD-10-CM | POA: Diagnosis not present

## 2022-09-01 DIAGNOSIS — E782 Mixed hyperlipidemia: Secondary | ICD-10-CM | POA: Diagnosis not present

## 2022-09-01 DIAGNOSIS — E039 Hypothyroidism, unspecified: Secondary | ICD-10-CM | POA: Diagnosis not present

## 2022-09-01 DIAGNOSIS — J4520 Mild intermittent asthma, uncomplicated: Secondary | ICD-10-CM | POA: Diagnosis not present

## 2022-09-01 DIAGNOSIS — I10 Essential (primary) hypertension: Secondary | ICD-10-CM | POA: Diagnosis not present

## 2022-09-01 DIAGNOSIS — G250 Essential tremor: Secondary | ICD-10-CM | POA: Diagnosis not present

## 2022-09-04 DIAGNOSIS — M48062 Spinal stenosis, lumbar region with neurogenic claudication: Secondary | ICD-10-CM | POA: Diagnosis not present

## 2022-09-04 DIAGNOSIS — Z6827 Body mass index (BMI) 27.0-27.9, adult: Secondary | ICD-10-CM | POA: Diagnosis not present

## 2022-09-15 DIAGNOSIS — G250 Essential tremor: Secondary | ICD-10-CM | POA: Diagnosis not present

## 2022-09-15 DIAGNOSIS — I10 Essential (primary) hypertension: Secondary | ICD-10-CM | POA: Diagnosis not present

## 2022-09-15 DIAGNOSIS — E782 Mixed hyperlipidemia: Secondary | ICD-10-CM | POA: Diagnosis not present

## 2022-09-15 DIAGNOSIS — E039 Hypothyroidism, unspecified: Secondary | ICD-10-CM | POA: Diagnosis not present

## 2022-09-15 DIAGNOSIS — J4520 Mild intermittent asthma, uncomplicated: Secondary | ICD-10-CM | POA: Diagnosis not present

## 2022-09-18 ENCOUNTER — Other Ambulatory Visit: Payer: Self-pay | Admitting: Diagnostic Neuroimaging

## 2022-09-20 ENCOUNTER — Emergency Department (HOSPITAL_COMMUNITY)
Admission: EM | Admit: 2022-09-20 | Discharge: 2022-09-20 | Disposition: A | Discharge status: Home / Self Care | Payer: Medicare Other | Attending: Student | Admitting: Student

## 2022-09-20 ENCOUNTER — Encounter (HOSPITAL_COMMUNITY): Payer: Self-pay

## 2022-09-20 DIAGNOSIS — L299 Pruritus, unspecified: Secondary | ICD-10-CM | POA: Diagnosis not present

## 2022-09-20 DIAGNOSIS — Z79899 Other long term (current) drug therapy: Secondary | ICD-10-CM | POA: Insufficient documentation

## 2022-09-20 DIAGNOSIS — R220 Localized swelling, mass and lump, head: Secondary | ICD-10-CM | POA: Diagnosis present

## 2022-09-20 DIAGNOSIS — E039 Hypothyroidism, unspecified: Secondary | ICD-10-CM | POA: Insufficient documentation

## 2022-09-20 DIAGNOSIS — T7840XA Allergy, unspecified, initial encounter: Secondary | ICD-10-CM | POA: Diagnosis not present

## 2022-09-20 DIAGNOSIS — T782XXA Anaphylactic shock, unspecified, initial encounter: Secondary | ICD-10-CM | POA: Diagnosis not present

## 2022-09-20 DIAGNOSIS — G20C Parkinsonism, unspecified: Secondary | ICD-10-CM | POA: Diagnosis not present

## 2022-09-20 DIAGNOSIS — J45909 Unspecified asthma, uncomplicated: Secondary | ICD-10-CM | POA: Insufficient documentation

## 2022-09-20 DIAGNOSIS — L509 Urticaria, unspecified: Secondary | ICD-10-CM | POA: Diagnosis not present

## 2022-09-20 DIAGNOSIS — I10 Essential (primary) hypertension: Secondary | ICD-10-CM | POA: Diagnosis not present

## 2022-09-20 DIAGNOSIS — R0689 Other abnormalities of breathing: Secondary | ICD-10-CM | POA: Diagnosis not present

## 2022-09-20 MED ORDER — FAMOTIDINE IN NACL 20-0.9 MG/50ML-% IV SOLN
20.0000 mg | Freq: Once | INTRAVENOUS | Status: AC
  Administered 2022-09-20: 20 mg via INTRAVENOUS
  Filled 2022-09-20: qty 50

## 2022-09-20 MED ORDER — DIPHENHYDRAMINE HCL 50 MG/ML IJ SOLN
25.0000 mg | Freq: Once | INTRAMUSCULAR | Status: AC
  Administered 2022-09-20: 25 mg via INTRAVENOUS
  Filled 2022-09-20: qty 1

## 2022-09-20 MED ORDER — METHYLPREDNISOLONE SODIUM SUCC 125 MG IJ SOLR
125.0000 mg | Freq: Once | INTRAMUSCULAR | Status: AC
  Administered 2022-09-20: 125 mg via INTRAVENOUS
  Filled 2022-09-20: qty 2

## 2022-09-20 MED ORDER — CARBIDOPA-LEVODOPA 25-100 MG PO TABS
2.0000 | ORAL_TABLET | Freq: Three times a day (TID) | ORAL | Status: DC
  Administered 2022-09-20: 2 via ORAL
  Filled 2022-09-20: qty 2

## 2022-09-20 MED ORDER — CARBIDOPA-LEVODOPA 25-100 MG PO TABS
2.0000 | ORAL_TABLET | Freq: Three times a day (TID) | ORAL | Status: DC
  Filled 2022-09-20: qty 2

## 2022-09-20 MED ORDER — CARBIDOPA-LEVODOPA 25-100 MG PO TABS
2.0000 | ORAL_TABLET | Freq: Three times a day (TID) | ORAL | Status: DC
Start: 2022-09-20 — End: 2022-09-20

## 2022-09-20 MED ORDER — EPINEPHRINE 0.3 MG/0.3ML IJ SOAJ: 0.3000 mg | INTRAMUSCULAR | 0 refills | Status: DC | PRN

## 2022-09-20 NOTE — ED Provider Notes (Signed)
[place] EMERGENCY DEPARTMENT AT [facility] Provider Note  CSN: [id number] Arrival date & time: 09/20/22 1818  Chief Complaint(s) No chief complaint on file.  HPI Jeanette [name] is a 77 y.o. female with PMH asthma, HTN, hypothyroidism, parkinsonism, previous TIA who presents emergency room for evaluation of a suspected allergic reaction.  Patient reportedly was at home, took a brisk evening walk, ate a chocolate covered almond and had sudden onset anaphylactic-like symptoms including oropharyngeal and facial swelling, pruritus and erythema, nausea vomiting and shortness of breath.  Patient was seen by medic in the field who administered 0.3 mg intramuscular epinephrine and patient was transferred to the hospital for further evaluation.  Here in the emergency room, symptoms have started to improve but patient still does have diffuse urticaria.  Denies chest pain, shortness of breath, abdominal pain, nausea, vomiting or other systemic symptoms.   Past Medical History Past Medical History:  Diagnosis Date   Arthritis    Asthma    hx   Hypertension    Hypothyroidism    Neuromuscular disorder (HCC)    tremors in hands   Parkinsonism (HCC)    Pneumonia    hx   PONV (postoperative nausea and vomiting)    has scop patch to apply  10/13/12   Stroke [facility]) 2011   tia's   Thyroid disease    TIA (transient ischemic attack)    Patient Active Problem List   Diagnosis Date Noted   Lumbar stenosis with neurogenic claudication 01/04/2018   Carotid stenosis, right 05/28/2013   Neck pain on right side 05/28/2013   Cervical spondylosis without myelopathy 10/14/2012   Hypertension    Thyroid disease    Endometrial polyp    TIA (transient ischemic attack)    Home Medication(s) Prior to Admission medications   Medication Sig Start Date End Date Taking? Authorizing Provider  acetaminophen (TYLENOL) 500 MG tablet Take 1,000 mg by mouth daily as needed for moderate pain or headache.     [provider]  albuterol (PROVENTIL HFA;VENTOLIN HFA) 108 (90 BASE) MCG/ACT inhaler Inhale 2 puffs into the lungs every 6 (six) hours as needed for wheezing.     [provider]  carbidopa-levodopa (SINEMET IR) 25-100 MG tablet Take 2 tablets by mouth 3 (three) times daily. 12/30/21   Penumalli, Glenford Bayley, MD  Cholecalciferol (VITAMIN D3) 5000 units CAPS Take 5,000 Units by mouth daily.    [provider]  dexamethasone (DECADRON) 1 MG tablet 2 tablets twice daily for 2 days, one tablet twice daily for 2 days, one tablet daily for 2 days. 10/22/21   Barnett Abu, MD  losartan (COZAAR) 100 MG tablet Take 100 mg by mouth in the morning. 03/10/16   [provider]  methocarbamol (ROBAXIN) 500 MG tablet Take 1 tablet (500 mg total) by mouth every 6 (six) hours as needed for muscle spasms. 10/22/21   Barnett Abu, MD  oxyCODONE-acetaminophen (PERCOCET/ROXICET) 5-325 MG tablet Take 1-2 tablets by mouth every 4 (four) hours as needed for moderate pain or severe pain. 10/22/21   Barnett Abu, MD  primidone (MYSOLINE) 50 MG tablet Take 1 tablet (50 mg total) by mouth 2 (two) times daily. 12/30/21   Penumalli, Glenford Bayley, MD  propranolol ER (INDERAL LA) 80 MG 24 hr capsule Take 1 capsule (80 mg total) by mouth daily. 12/30/21   Penumalli, Glenford Bayley, MD  SYNTHROID 88 MCG tablet Take 88 mcg by mouth daily before breakfast. 03/03/16   [provider]  traMADol (ULTRAM) 50 MG tablet Take 50 mg by mouth every 6 (six) hours as needed for severe pain.    [provider]  venlafaxine XR (EFFEXOR-XR) 37.5 MG 24 hr capsule Take 37.5 mg by mouth every evening.  03/29/16   [provider]                                                                                                                                    Past Surgical History Past Surgical History:  Procedure Laterality Date   ANTERIOR CERVICAL DECOMP/DISCECTOMY FUSION N/A 10/14/2012   Procedure:  ANTERIOR CERVICAL DECOMPRESSION/DISCECTOMY FUSION 1 LEVEL/HARDWARE REMOVAL;  Surgeon: Barnett Abu, MD;  Location: [facility];  Service: Neurosurgery;  Laterality: N/A;  C7-T1 Anterior cervical decompression/diskectomy/fusion/removal of old synthes plate   BACK SURGERY  01   BACK SURGERY  2019   DILATION AND CURETTAGE OF UTERUS     HYSTEROSCOPY  2004, 2007   endometrial polyp   KNEE SURGERY Right    77 yrs old   NASAL SINUS SURGERY Right 08/2016   polyp removed   NECK SURGERY  07,03   cerv,Fusion   ROTATOR CUFF REPAIR Right 2015   TONSILLECTOMY     Family History Family History  Problem Relation Age of Onset   Leukemia Mother    Ovarian cancer Mother    Tremor Mother    Polycythemia Mother    Hypertension Father    Heart disease Father    Parkinson's disease Maternal Aunt    Stroke Maternal Aunt    Parkinson's disease Maternal Uncle    Multiple sclerosis Maternal Uncle    Colon cancer Neg Hx     Social History Social History   Tobacco Use   Smoking status: Never   Smokeless tobacco: Never  Vaping Use   Vaping Use: Never used  Substance Use Topics   Alcohol use: Yes    Alcohol/week: 1.0 standard drink of alcohol    Types: 1 Glasses of wine per week    Comment: wine 1-2 week   Drug use: No   Allergies Compazine [prochlorperazine edisylate]  Review of Systems Review of Systems  Respiratory:  Positive for shortness of breath.   Skin:  Positive for rash.    Physical Exam Vital Signs  I have reviewed the triage vital signs BP (!) 146/111   Pulse (!) 52   Temp 97.7 F (36.5 C) (Oral)   Resp 18   SpO2 94%   Physical Exam Vitals and nursing note reviewed.  Constitutional:      General: She is not in acute distress.    Appearance: She is well-developed.  HENT:     Head: Normocephalic and atraumatic.  Eyes:     Conjunctiva/sclera: Conjunctivae normal.  Cardiovascular:     Rate and Rhythm: Normal rate and regular rhythm.     Heart sounds: No murmur  heard. Pulmonary:  Effort: Pulmonary effort is normal. No respiratory distress.     Breath sounds: Normal breath sounds.  Abdominal:     Palpations: Abdomen is soft.     Tenderness: There is no abdominal tenderness.  Musculoskeletal:        General: No swelling.     Cervical back: Neck supple.  Skin:    General: Skin is warm and dry.     Capillary Refill: Capillary refill takes less than 2 seconds.     Findings: Rash present.  Neurological:     Mental Status: She is alert.  Psychiatric:        Mood and Affect: Mood normal.     ED Results and Treatments Labs (all labs ordered are listed, but only abnormal results are displayed) Labs Reviewed - No data to display                                                                                                                        Radiology No results found.  Pertinent labs & imaging results that were available during my care of the patient were reviewed by me and considered in my medical decision making (see MDM for details).  Medications Ordered in ED Medications  famotidine (PEPCID) IVPB 20 mg premix (20 mg Intravenous New Bag/Given 09/20/22 1845)  methylPREDNISolone sodium succinate (SOLU-MEDROL) 125 mg/2 mL injection 125 mg (125 mg Intravenous Given 09/20/22 1845)  diphenhydrAMINE (BENADRYL) injection 25 mg (25 mg Intravenous Given 09/20/22 1844)                                                                                                                                     Procedures Procedures  (including critical care time)  Medical Decision Making / ED Course   This patient presents to the ED for concern of anaphylaxis, this involves an extensive number of treatment options, and is a complaint that carries with it a high risk of complications and morbidity.  The differential diagnosis includes anaphylaxis, allergic reaction, contact dermatitis, angioedema  MDM: Patient seen emergency room for evaluation of  suspected anaphylaxis.  Physical exam reveals an apparent improvement in her facial swelling but does show persistent urticaria.  Pulmonary exam without wheezing.  Patient given methylprednisolone and Benadryl and observed in the emergency department for approximately 4 hours.  She did not have any return of airway threatening symptoms and her urticaria ultimately resolved.  Patient will be discharged with an EpiPen and an ambulatory referral was placed to outpatient allergy.  Patient given return precautions of which she voiced understanding she was discharged.   Additional history obtained: -Additional history obtained from husband -External records from outside source obtained and reviewed including: Chart review including previous notes, labs, imaging, consultation notes   Lab Tests: -I ordered, reviewed, and interpreted labs.   The pertinent results include:   Labs Reviewed - No data to display     Medicines ordered and prescription drug management: Meds ordered this encounter  Medications   methylPREDNISolone sodium succinate (SOLU-MEDROL) 125 mg/2 mL injection 125 mg    IV methylprednisolone will be converted to either a q12h or q24h frequency with the same total daily dose (TDD).  Ordered Dose: 1 to 125 mg TDD; convert to: TDD q24h.  Ordered Dose: 126 to 250 mg TDD; convert to: TDD div q12h.  Ordered Dose: >250 mg TDD; DAW.   famotidine (PEPCID) IVPB 20 mg premix   diphenhydrAMINE (BENADRYL) injection 25 mg    -I have reviewed the patients home medicines and have made adjustments as needed  Critical interventions none    Cardiac Monitoring: The patient was maintained on a cardiac monitor.  I personally viewed and interpreted the cardiac monitored which showed an underlying rhythm of: NSR  Social Determinants of Health:  Factors impacting patients care include: none   Reevaluation: After the interventions noted above, I reevaluated the patient and found that they have  :improved  Co morbidities that complicate the patient evaluation  Past Medical History:  Diagnosis Date   Arthritis    Asthma    hx   Hypertension    Hypothyroidism    Neuromuscular disorder (HCC)    tremors in hands   Parkinsonism (HCC)    Pneumonia    hx   PONV (postoperative nausea and vomiting)    has scop patch to apply  10/13/12   Stroke [facility]) 2011   tia's   Thyroid disease    TIA (transient ischemic attack)       Dispostion: I considered admission for this patient, but at this time she does not meet inpatient criteria for admission and she is safe for discharge with outpatient follow-up     Final Clinical Impression(s) / ED Diagnoses Final diagnoses:  None     @PCDICTATION @    Glendora Score, MD 09/20/22 2255

## 2022-09-20 NOTE — ED Triage Notes (Addendum)
BIBA from home for possible allergic reaction. Sudden onset itching in hands, rash, swelling in face, sweating. Pt does not know what could have caused the reaction. 0.3 IM epi left deltoid 50 HR 156/82 BP 99% room air

## 2022-10-12 ENCOUNTER — Telehealth: Payer: Self-pay | Admitting: Diagnostic Neuroimaging

## 2022-10-12 NOTE — Telephone Encounter (Signed)
Called patient and LVM for her to return phone call.

## 2022-10-12 NOTE — Telephone Encounter (Signed)
Pt has scheduled a f/u for concern for her worsening tremor, pt has agreed to wait list as well.  Pt is asking for a call to discuss the tremor to see if there is anything Dr Marjory Lies will suggest while pt is waiting to be seen.

## 2022-10-13 NOTE — Telephone Encounter (Signed)
Called Patient and schedule Virtual visit for 10/27/22 @ 2:45p

## 2022-10-13 NOTE — Telephone Encounter (Signed)
Called patient and she states lately her tremors has gotten worse. Patient states she has trouble doing her hair and try to keep food on her fork while out has made her feel embarrass to be out. Patient states she take 2 in am, 1 at noon and 2 at night of her Rx carbidopa-levodopa a day Patient states she takes 2 Rx Primidone 1 in the am and 1 pm. Patient next appointment isn't till 03/02/23 and she would like to know what should she do until then?

## 2022-10-13 NOTE — Telephone Encounter (Signed)
At 4:57 pt left a vm stating she was speaking with RN and they were some how disconnected, she is asking for a call back.

## 2022-10-26 DIAGNOSIS — M25512 Pain in left shoulder: Secondary | ICD-10-CM | POA: Diagnosis not present

## 2022-10-26 DIAGNOSIS — M1712 Unilateral primary osteoarthritis, left knee: Secondary | ICD-10-CM | POA: Diagnosis not present

## 2022-10-27 ENCOUNTER — Telehealth (INDEPENDENT_AMBULATORY_CARE_PROVIDER_SITE_OTHER): Payer: Medicare Other | Admitting: Diagnostic Neuroimaging

## 2022-10-27 ENCOUNTER — Encounter: Payer: Self-pay | Admitting: Diagnostic Neuroimaging

## 2022-10-27 DIAGNOSIS — G20C Parkinsonism, unspecified: Secondary | ICD-10-CM | POA: Diagnosis not present

## 2022-10-27 DIAGNOSIS — G250 Essential tremor: Secondary | ICD-10-CM

## 2022-10-27 MED ORDER — PRIMIDONE 50 MG PO TABS: 50.0000 mg | ORAL_TABLET | Freq: Two times a day (BID) | ORAL | 5 refills | Status: DC

## 2022-10-27 MED ORDER — PROPRANOLOL HCL ER 80 MG PO CP24: 80.0000 mg | ORAL_CAPSULE | Freq: Every day | ORAL | 4 refills | Status: DC

## 2022-10-27 MED ORDER — CARBIDOPA-LEVODOPA 25-100 MG PO TABS
2.0000 | ORAL_TABLET | Freq: Three times a day (TID) | ORAL | 4 refills | Status: DC
Start: 2022-10-27 — End: 2023-01-05

## 2022-10-27 NOTE — Progress Notes (Signed)
Virtual Visit via Video Note  I connected with Jeanette [name] on 10/27/22 at  2:45 PM EDT by a video enabled telemedicine application and verified that I am speaking with the correct person using two identifiers.   I discussed the limitations of evaluation and management by telemedicine and the availability of in person appointments. The patient expressed understanding and agreed to proceed.  Patient is at their home. I am at the office.    History of Present Illness:  UPDATE (10/27/22, VRP): Since last visit, doing better on carb/levo. However more postural / action tremor in last few months. Balance is stable.   UPDATE (12/30/21, VRP): Since last visit, doing well. Had T12-L1 surgery in June 2023.  Parkinson symptoms are stable. No alleviating or aggravating factors. Tolerating meds.    UPDATE (07/01/21, VRP): Since last visit, doing well, except at she reduced carb/levo on her own (inadvertantly) and now tremors worse.   UPDATE (12/31/20, VRP): Since last visit, doing about the same. Resting tremor and balance stable. Postural tremor more bothersome. Taking 1-2 tabs carb/levo; three times a day; nausea limits dosing.   UPDATE (12/26/19, VRP): Since last visit, doing well. Symptoms are slightly progressed. Severity mild-mod. No alleviating or aggravating factors. Tolerating meds. Taking carb/levo 3-4 tabs per day. Tremor slightly increased. Balance stable.  UPDATE (12/20/18, VRP): Since last visit, doing well. Symptoms are stable. Severity is mild. No alleviating or aggravating factors. Tolerating meds. Some afternoon tremors are noticeable. Has had some falls.   UPDATE (03/21/18, VRP): Since last visit, doing more head tremor issues in last 3-4 months. Symptoms are progressive and mild. No alleviating or aggravating factors. Tolerating meds.    UPDATE (04/05/17, VRP): Since last visit, doing well. Tolerating meds. No alleviating or aggravating factors. Carb/levo is helping. Patient  taking twice a day.   UPDATE 09/29/16: Since last visit, doing well. Carb/levo helping tremor control. Mainly taking twice a day. No other new symptoms.  UPDATE 06/23/16: Since last visit no change in sxs. MRI reviewed. Ready to start carb/levo.   PRIOR HPI (03/11/16): 77 year old female here for evaluation of tremor. Patient is right handed and has hypertension. For past 8-10 years, patient has had intermittent postural and action tremor. This is progressively worsened over time. More recently she has noticed subtle resting tremor. Patient is having more difficulty with holding objects, drinking from a cup, handwriting. Symptoms fluctuate from day to day. She has a glass of wine sometimes this calms down some of her tremor. Patient has been on propranolol for blood pressure control, and was advised to take an extra tablet of this to help with tremor control. However patient has not tried taking the extra propranolol. Patient has significant family history for tremor. Patient's mother had tremor but not specifically diagnosed. Patient's mother also had polycythemia vera and later leukemia. Patient's son has tremor starting at age 80 years old. Patient's maternal aunts 2 and maternal uncle 1 at Parkinson's disease. Several first cousins on her mother's side also have Parkinson's disease. A different maternal uncle had multiple sclerosis. Patient also has noted some hoarse voice, intermittent choking sensation, mild drooling from the right side of her mouth. No change in smell or taste. No significant anxiety or constipation. No leg tremor. She has some mild balance and walking difficulty.  Patient has had multiple cervical spine surgeries in 2003, 2007 in 2014. 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 She is also had lumbar spine surgeries in 2001 and 2014. Patient  had some complication of surgery including hoarse voice.  Patient had significant neck pain and hoarse voice in 2015, was found to  have right internal carotid artery stenosis, and possible dissection. Conventional angiogram was performed which showed tortuous right ICA without evidence of dissection.      Observations/Objective:  Postural tremor in BUE; video visit   01/17/21 DATscan Normal Ioflupane scan. No reduced radiotracer activity in basal ganglia to suggest Parkinson's syndrome pathology.   Assessment and Plan:  Dx:  1. Parkinsonism, unspecified Parkinsonism type   2. Essential tremor       PARKINSONISM (subtle finding; DATscan negative) - continue carbidopa/levodopa for parkinsonism and resting tremor (try to take 2 tabs three times a day); may wean off in future, but night time doses helping with RLS symptoms - may consider rasagiline, stalevo or rytary in future - continue PT exercises at home for balance and gait; use cane or walker as needed   ESSENTIAL TREMOR - continue propranolol 80mg  daily for essential tremor and BP control - increase primidone up to 100mg  twice a day    RIGHT ICA NARROWING / STENOSIS - improved on June 2017 CTA head/neck; monitor for now    Follow Up Instructions:  - Return in about 2 months (around 12/27/2022). In office visit requested    I discussed the assessment and treatment plan with the patient. The patient was provided an opportunity to ask questions and all were answered. The patient agreed with the plan and demonstrated an understanding of the instructions.   The patient was advised to call back or seek an in-person evaluation if the symptoms worsen or if the condition fails to improve as anticipated.  I provided 15 minutes of non-face-to-face time during this encounter.   Suanne Marker, MD 10/27/2022, 3:21 PM Certified in Neurology, Neurophysiology and Neuroimaging  [facility] [address], [address] [place], [place] [address] [phone]

## 2022-10-27 NOTE — Patient Instructions (Signed)
PARKINSONISM (subtle finding; DATscan negative) - continue carbidopa/levodopa for parkinsonism and resting tremor (try to take 2 tabs three times a day); may wean off in future, but night time doses helping with RLS symptoms - continue PT exercises at home for balance and gait; use cane or walker as needed   ESSENTIAL TREMOR - continue propranolol 80mg  daily for essential tremor and BP control - increase primidone up to 100mg  twice a day

## 2022-11-04 DIAGNOSIS — M25512 Pain in left shoulder: Secondary | ICD-10-CM | POA: Diagnosis not present

## 2022-11-04 DIAGNOSIS — M7632 Iliotibial band syndrome, left leg: Secondary | ICD-10-CM | POA: Diagnosis not present

## 2022-11-05 ENCOUNTER — Other Ambulatory Visit: Payer: Self-pay

## 2022-11-05 ENCOUNTER — Encounter: Payer: Self-pay | Admitting: Allergy & Immunology

## 2022-11-05 ENCOUNTER — Ambulatory Visit (INDEPENDENT_AMBULATORY_CARE_PROVIDER_SITE_OTHER): Payer: Medicare Other | Admitting: Allergy & Immunology

## 2022-11-05 VITALS — BP 118/74 | HR 65 | Temp 99.0°F | Resp 16 | Ht 60.0 in | Wt 148.5 lb

## 2022-11-05 DIAGNOSIS — M47812 Spondylosis without myelopathy or radiculopathy, cervical region: Secondary | ICD-10-CM

## 2022-11-05 DIAGNOSIS — G20A1 Parkinson's disease without dyskinesia, without mention of fluctuations: Secondary | ICD-10-CM

## 2022-11-05 DIAGNOSIS — T7840XD Allergy, unspecified, subsequent encounter: Secondary | ICD-10-CM

## 2022-11-05 DIAGNOSIS — T7840XA Allergy, unspecified, initial encounter: Secondary | ICD-10-CM | POA: Diagnosis not present

## 2022-11-05 DIAGNOSIS — G459 Transient cerebral ischemic attack, unspecified: Secondary | ICD-10-CM | POA: Diagnosis not present

## 2022-11-05 DIAGNOSIS — I159 Secondary hypertension, unspecified: Secondary | ICD-10-CM | POA: Diagnosis not present

## 2022-11-05 NOTE — Progress Notes (Signed)
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 Allergic reaction - Testing was negative on the skin today, but the positive control was not very reactive (meaning we cannot be sure about the validity of the test).  - We are getting blood work to confirm this. - Almond allergy is VERY rare, but we will see what the blood work shows. - We are also going to look for alpha gal, which is a red meat allergy.  - Emergency Anaphylaxis Plan provided. - EpiPen training reviewed.   2. Return in about 4 weeks (around 12/03/2022) for Chocolate covered almond challenge. You can have the follow up appointment with Dr. Dellis Anes or a Nurse Practicioner (our Nurse Practitioners are excellent and always have Physician oversight!).   This note in its entirety was forwarded to the Provider who requested this consultation.  Subjective:   Jeanette [name] is a 77 y.o. female presenting today for evaluation of  Chief Complaint  Patient presents with   Allergic Rhinitis     On mother's day she had chocolate covered almonds - 15-20 minutes after her palms, hands, arms, and back was covered with hives. Her throat was closing husband called 911 epi and oxygen was given to the patient. ( Other 2 times she had hives and GI issues)     Jeanette [name] has a history of the following: Patient Active Problem List   Diagnosis Date Noted   Lumbar stenosis with neurogenic claudication 01/04/2018   Carotid stenosis, right 05/28/2013   Neck pain on right side 05/28/2013   Cervical spondylosis without myelopathy 10/14/2012   Hypertension    Thyroid disease    Endometrial polyp    TIA  (transient ischemic attack)     History obtained from: chart review and patient.  Jeanette [name] was referred by Georgianne Fick, MD.     Jeanette [name] is a 77 y.o. female presenting for an evaluation of an almond allergy .   Asthma/Respiratory Symptom History: She had asthma in the 1970s.   Allergic Rhinitis Symptom History: She has no issues with pollen or environmental allergies.   Food Allergy Symptom History: She had almonds on Mother's Day.  They had gone out to eat at a [place] and she ate a taco salad. This did contain beef and the normal taco salad stuff. She ate two of them and one of them tasted funny. Around 15 minutes later, she had redness on the palms of her hands. She was very pruritic. Then she started having throat swelling. She had to tell her husband to call 911. They came and administered oxygen and vitals. She went to [facility] and she was monitored for four hours. Epinephrine helped.   These were chocolate covered almonds that she buys in the grocery store. She had been eating them for years without a problem. She is unsure of the funny tasting one - maybe this was the trigger that caused issues. She has not eaten them since that time and she has eaten other tree nuts and they have not bothered her. She has an EpiPen now.   She eats red meat rarely. This is no more than once per week. She mostly eats fish and  salads and chicken.  Of note, she has reacted to cashews. This was within the last year. She had itching over her entire body. But it was not to the same extent as the almonds. This is the only time that her throat has swollen shut.   Two years ago, she started having itching on her palms and her back. She was visiting her son at the time. She had some stomach pain and it just resolved on its own. She did have some diarrhea and things improved.  She always has the GI symptoms with these foods. The skin manifestations are not consistent.   Otherwise, there  is no history of other atopic diseases, including asthma, drug allergies, environmental allergies, stinging insect allergies, or contact dermatitis. There is no significant infectious history. Vaccinations are up to date.    Past Medical History: Patient Active Problem List   Diagnosis Date Noted   Lumbar stenosis with neurogenic claudication 01/04/2018   Carotid stenosis, right 05/28/2013   Neck pain on right side 05/28/2013   Cervical spondylosis without myelopathy 10/14/2012   Hypertension    Thyroid disease    Endometrial polyp    TIA (transient ischemic attack)     Medication List:  Allergies as of 11/05/2022       Reactions   Compazine [prochlorperazine Edisylate] Anaphylaxis, Swelling   Mouth and tongue swell, throat swells closed        Medication List        Accurate as of November 05, 2022 11:59 PM. If you have any questions, ask your nurse or doctor.          acetaminophen 500 MG tablet Commonly known as: TYLENOL Take 1,000 mg by mouth daily as needed for moderate pain or headache.   albuterol 108 (90 Base) MCG/ACT inhaler Commonly known as: VENTOLIN HFA Inhale 2 puffs into the lungs every 6 (six) hours as needed for wheezing.   carbidopa-levodopa 25-100 MG tablet Commonly known as: SINEMET IR Take 2 tablets by mouth 3 (three) times daily.   EPINEPHrine 0.3 mg/0.3 mL Soaj injection Commonly known as: EPI-PEN Inject 0.3 mg into the muscle as needed for anaphylaxis.   losartan 100 MG tablet Commonly known as: COZAAR Take 100 mg by mouth in the morning.   methocarbamol 500 MG tablet Commonly known as: ROBAXIN Take 1 tablet (500 mg total) by mouth every 6 (six) hours as needed for muscle spasms.   primidone 50 MG tablet Commonly known as: MYSOLINE Take 1-2 tablets (50-100 mg total) by mouth in the morning and at bedtime.   propranolol ER 80 MG 24 hr capsule Commonly known as: INDERAL LA Take 1 capsule (80 mg total) by mouth daily.   Synthroid 88  MCG tablet Generic drug: levothyroxine Take 88 mcg by mouth daily before breakfast.   venlafaxine XR 37.5 MG 24 hr capsule Commonly known as: EFFEXOR-XR Take 37.5 mg by mouth every evening.   Vitamin D3 125 MCG (5000 UT) Caps Take 5,000 Units by mouth daily.        Birth History: non-contributory  Developmental History: non-contributory  Past Surgical History: Past Surgical History:  Procedure Laterality Date   ANTERIOR CERVICAL DECOMP/DISCECTOMY FUSION N/A 10/14/2012   Procedure: ANTERIOR CERVICAL DECOMPRESSION/DISCECTOMY FUSION 1 LEVEL/HARDWARE REMOVAL;  Surgeon: Barnett Abu, MD;  Location: [facility];  Service: Neurosurgery;  Laterality: N/A;  C7-T1 Anterior cervical decompression/diskectomy/fusion/removal of old synthes plate   BACK SURGERY  01   BACK SURGERY  2019   DILATION AND  CURETTAGE OF UTERUS     HYSTEROSCOPY  2004, 2007   endometrial polyp   KNEE SURGERY Right    77 yrs old   NASAL SINUS SURGERY Right 08/2016   polyp removed   NECK SURGERY  07,03   cerv,Fusion   ROTATOR CUFF REPAIR Right 2015   TONSILLECTOMY       Family History: Family History  Problem Relation Age of Onset   Leukemia Mother    Ovarian cancer Mother    Tremor Mother    Polycythemia Mother    Hypertension Father    Heart disease Father    Parkinson's disease Maternal Aunt    Stroke Maternal Aunt    Parkinson's disease Maternal Uncle    Multiple sclerosis Maternal Uncle    Colon cancer Neg Hx      Social History: Amorie lives at home with her family.  She lives in a house that is 78 years old.  There is hardwood throughout the home.  They have central heating and cooling.  There are 2 dogs inside of the home.  There are dust mite covers on the bedding.  There is no tobacco exposure.  She does not live near an interstate or industrial area.  There are no fumes, chemicals, or dust.   Review of Systems  Constitutional: Negative.  Negative for chills, fever, malaise/fatigue and  weight loss.  HENT: Negative.  Negative for congestion, ear discharge, ear pain and sinus pain.   Eyes:  Negative for pain, discharge and redness.  Respiratory:  Negative for cough, sputum production, shortness of breath and wheezing.   Cardiovascular: Negative.  Negative for chest pain and palpitations.  Gastrointestinal:  Positive for abdominal pain. Negative for constipation, diarrhea, heartburn, nausea and vomiting.  Skin:  Positive for itching and rash.  Neurological:  Negative for dizziness and headaches.  Endo/Heme/Allergies:  Negative for environmental allergies. Does not bruise/bleed easily.       Objective:   Blood pressure 118/74, pulse 65, temperature 99 F (37.2 C), resp. rate 16, height 5' (1.524 m), weight 148 lb 8 oz (67.4 kg), SpO2 95 %. Body mass index is 29 kg/m.     Physical Exam Vitals reviewed.  Constitutional:      Appearance: She is well-developed.     Comments: Tremor throughout the visit. Friendly.   HENT:     Head: Normocephalic and atraumatic.     Right Ear: Tympanic membrane, ear canal and external ear normal. No drainage, swelling or tenderness. Tympanic membrane is not injected, scarred, erythematous, retracted or bulging.     Left Ear: Tympanic membrane, ear canal and external ear normal. No drainage, swelling or tenderness. Tympanic membrane is not injected, scarred, erythematous, retracted or bulging.     Nose: No nasal deformity, septal deviation, mucosal edema or rhinorrhea.     Right Sinus: No maxillary sinus tenderness or frontal sinus tenderness.     Left Sinus: No maxillary sinus tenderness or frontal sinus tenderness.     Mouth/Throat:     Mouth: Mucous membranes are not pale and not dry.     Pharynx: Uvula midline.  Eyes:     General:        Right eye: No discharge.        Left eye: No discharge.     Conjunctiva/sclera: Conjunctivae normal.     Right eye: Right conjunctiva is not injected. No chemosis.    Left eye: Left conjunctiva  is not injected. No chemosis.    Pupils:  Pupils are equal, round, and reactive to light.  Cardiovascular:     Rate and Rhythm: Normal rate and regular rhythm.     Heart sounds: Normal heart sounds.  Pulmonary:     Effort: Pulmonary effort is normal. No tachypnea, accessory muscle usage or respiratory distress.     Breath sounds: Normal breath sounds. No wheezing, rhonchi or rales.  Chest:     Chest wall: No tenderness.  Abdominal:     Tenderness: There is no abdominal tenderness. There is no guarding or rebound.  Lymphadenopathy:     Head:     Right side of head: No submandibular, tonsillar or occipital adenopathy.     Left side of head: No submandibular, tonsillar or occipital adenopathy.     Cervical: No cervical adenopathy.  Skin:    Coloration: Skin is not pale.     Findings: No abrasion, erythema, petechiae or rash. Rash is not papular, urticarial or vesicular.  Neurological:     Mental Status: She is alert.  Psychiatric:        Behavior: Behavior is cooperative.      Diagnostic studies:   Allergy Studies:     Food Adult Perc - 11/05/22 1400     Time Antigen Placed 1407    Allergen Manufacturer Waynette Buttery    Location Arm    Number of allergen test 10     Control-buffer 50% Glycerol Negative    Control-Histamine Negative    1. Peanut Negative    10. Cashew Negative    11. Walnut Food Negative    12. Almond Negative    13. Hazelnut Negative    14. Pecan Food Negative    15. Pistachio Negative    16. [place] Nut Negative    17. Coconut Negative    66. Chocolate/Cacao Bean Negative    Comments ATTEMPTED TWO HISTAMINES             Allergy testing results were read and interpreted by myself, documented by clinical staff.         Malachi Bonds, MD [facility]

## 2022-11-05 NOTE — Patient Instructions (Addendum)
1. Allergic reaction - Testing was negative on the skin today, but the positive control was not very reactive (meaning we cannot be sure about the validity of the test).  - We are getting blood work to confirm this. - Almond allergy is VERY rare, but we will see what the blood work shows. - We are also going to look for alpha gal, which is a red meat allergy.  - Emergency Anaphylaxis Plan provided. - EpiPen training reviewed.   2. Return in about 4 weeks (around 12/03/2022) for Chocolate covered almond challenge. You can have the follow up appointment with Dr. Dellis Anes or a Nurse Practicioner (our Nurse Practitioners are excellent and always have Physician oversight!).    Please inform us of any Emergency Department visits, hospitalizations, or changes in symptoms. Call us before going to the ED for breathing or allergy symptoms since we might be able to fit you in for a sick visit. Feel free to contact us anytime with any questions, problems, or concerns.  It was a pleasure to meet you today!  Websites that have reliable patient information: 1. American Academy of Asthma, Allergy, and Immunology: www.aaaai.org 2. Food Allergy Research and Education (FARE): foodallergy.org 3. Mothers of Asthmatics: http://www.asthmacommunitynetwork.org 4. American College of Allergy, Asthma, and Immunology: www.acaai.org   COVID-19 Vaccine Information can be found at: [email or web address] For questions related to vaccine distribution or appointments, please email [email or web address] or call [phone].   We realize that you might be concerned about having an allergic reaction to the COVID19 vaccines. To help with that concern, WE ARE OFFERING THE COVID19 VACCINES IN OUR OFFICE! Ask the front desk for dates!     "Like" [place] on Facebook and Instagram for our latest updates!      A healthy democracy works best when [employer] participate! Make  sure you are registered to vote! If you have moved or changed any of your contact information, you will need to get this updated before voting!  In some cases, you MAY be able to register to vote online: [email or web address]

## 2022-11-07 ENCOUNTER — Encounter: Payer: Self-pay | Admitting: Allergy & Immunology

## 2022-11-09 LAB — ALPHA-GAL PANEL
Allergen Lamb IgE: 2.27 kU/L — AB
Beef IgE: 6.33 kU/L — AB
IgE (Immunoglobulin E), Serum: 147 IU/mL (ref 6–495)
O215-IgE Alpha-Gal: 26.2 kU/L — AB
Pork IgE: 2.75 kU/L — AB

## 2022-11-09 LAB — ALLERGEN CHOCOLATE: Chocolate/Cacao IgE: <0.1 kU/L

## 2022-11-09 LAB — TRYPTASE: Tryptase: 5.5 ug/L (ref 2.2–13.2)

## 2022-11-17 DIAGNOSIS — M25512 Pain in left shoulder: Secondary | ICD-10-CM | POA: Diagnosis not present

## 2022-11-17 DIAGNOSIS — M25552 Pain in left hip: Secondary | ICD-10-CM | POA: Diagnosis not present

## 2022-11-17 DIAGNOSIS — M6281 Muscle weakness (generalized): Secondary | ICD-10-CM | POA: Diagnosis not present

## 2022-11-19 DIAGNOSIS — M25512 Pain in left shoulder: Secondary | ICD-10-CM | POA: Diagnosis not present

## 2022-11-19 DIAGNOSIS — M6281 Muscle weakness (generalized): Secondary | ICD-10-CM | POA: Diagnosis not present

## 2022-11-19 DIAGNOSIS — M25552 Pain in left hip: Secondary | ICD-10-CM | POA: Diagnosis not present

## 2022-11-24 DIAGNOSIS — M25552 Pain in left hip: Secondary | ICD-10-CM | POA: Diagnosis not present

## 2022-11-24 DIAGNOSIS — M25512 Pain in left shoulder: Secondary | ICD-10-CM | POA: Diagnosis not present

## 2022-11-24 DIAGNOSIS — M6281 Muscle weakness (generalized): Secondary | ICD-10-CM | POA: Diagnosis not present

## 2022-11-26 DIAGNOSIS — M25512 Pain in left shoulder: Secondary | ICD-10-CM | POA: Diagnosis not present

## 2022-11-26 DIAGNOSIS — M25552 Pain in left hip: Secondary | ICD-10-CM | POA: Diagnosis not present

## 2022-11-26 DIAGNOSIS — M6281 Muscle weakness (generalized): Secondary | ICD-10-CM | POA: Diagnosis not present

## 2022-12-02 DIAGNOSIS — M6281 Muscle weakness (generalized): Secondary | ICD-10-CM | POA: Diagnosis not present

## 2022-12-02 DIAGNOSIS — M25512 Pain in left shoulder: Secondary | ICD-10-CM | POA: Diagnosis not present

## 2022-12-02 DIAGNOSIS — M25552 Pain in left hip: Secondary | ICD-10-CM | POA: Diagnosis not present

## 2022-12-04 DIAGNOSIS — M25512 Pain in left shoulder: Secondary | ICD-10-CM | POA: Diagnosis not present

## 2022-12-04 DIAGNOSIS — M25552 Pain in left hip: Secondary | ICD-10-CM | POA: Diagnosis not present

## 2022-12-04 DIAGNOSIS — M6281 Muscle weakness (generalized): Secondary | ICD-10-CM | POA: Diagnosis not present

## 2022-12-08 DIAGNOSIS — M25512 Pain in left shoulder: Secondary | ICD-10-CM | POA: Diagnosis not present

## 2022-12-08 DIAGNOSIS — M25552 Pain in left hip: Secondary | ICD-10-CM | POA: Diagnosis not present

## 2022-12-08 DIAGNOSIS — M6281 Muscle weakness (generalized): Secondary | ICD-10-CM | POA: Diagnosis not present

## 2022-12-10 DIAGNOSIS — M6281 Muscle weakness (generalized): Secondary | ICD-10-CM | POA: Diagnosis not present

## 2022-12-10 DIAGNOSIS — M25552 Pain in left hip: Secondary | ICD-10-CM | POA: Diagnosis not present

## 2022-12-10 DIAGNOSIS — M25512 Pain in left shoulder: Secondary | ICD-10-CM | POA: Diagnosis not present

## 2022-12-16 DIAGNOSIS — M25512 Pain in left shoulder: Secondary | ICD-10-CM | POA: Diagnosis not present

## 2022-12-22 ENCOUNTER — Other Ambulatory Visit: Payer: Self-pay

## 2022-12-22 ENCOUNTER — Encounter: Payer: Self-pay | Admitting: Allergy & Immunology

## 2022-12-22 ENCOUNTER — Ambulatory Visit (INDEPENDENT_AMBULATORY_CARE_PROVIDER_SITE_OTHER): Payer: Medicare Other | Admitting: Allergy & Immunology

## 2022-12-22 VITALS — BP 130/80 | HR 63 | Temp 98.2°F | Resp 16 | Ht 60.0 in | Wt 147.9 lb

## 2022-12-22 DIAGNOSIS — J4520 Mild intermittent asthma, uncomplicated: Secondary | ICD-10-CM | POA: Diagnosis not present

## 2022-12-22 DIAGNOSIS — T7840XD Allergy, unspecified, subsequent encounter: Secondary | ICD-10-CM

## 2022-12-22 MED ORDER — ALBUTEROL SULFATE HFA 108 (90 BASE) MCG/ACT IN AERS
2.0000 | INHALATION_SPRAY | RESPIRATORY_TRACT | 1 refills | Status: DC | PRN
Start: 2022-12-22 — End: 2023-12-14

## 2022-12-22 NOTE — Progress Notes (Signed)
FOLLOW UP  Date of Service/Encounter:  12/22/22   Assessment:   Allergic reaction - with negative testing to tree nuts (confirming with blood work)  Print production planner gal syndrome - with high    Complex past medical history including Parkinson's, hyperlipidemia, spinal stenosis, and squamous cell carcinoma    Plan/Recommendations:   1. Allergic reaction - We are getting that blood work to look for the nut allergy.  - I am confused by the tolerance of the hamburgers and your very high numbers on the lab results.  - I guess it is OK to continue with eating the hamburgers since you have done fine.  - EpiPen is up to date.  - Almond allergy is VERY rare, but we will see what the blood work shows.    2. Intermittent asthma, uncomplicated - Lung testing looked fairly decent today, but not entirely normal.  - We did not give albuterol today to see if the spirometry reverses, but we can consider that in the future if needed.  - Continue with albuterol as needed for now.    3. Return in about 6 months (around 06/24/2023). You can have the follow up appointment with Dr. Dellis Anes or a Nurse Practicioner (our Nurse Practitioners are excellent and always have Physician oversight!).    Subjective:   Jeanette [name] is a 77 y.o. female presenting today for follow up of  Chief Complaint  Patient presents with   Allergic Rhinitis     No issues    Jeanette [name] has a history of the following: Patient Active Problem List   Diagnosis Date Noted   Lumbar stenosis with neurogenic claudication 01/04/2018   Carotid stenosis, right 05/28/2013   Neck pain on right side 05/28/2013   Cervical spondylosis without myelopathy 10/14/2012   Hypertension    Thyroid disease    Endometrial polyp    TIA (transient ischemic attack)     History obtained from: chart review and patient.  Jeanette [name] is a 77 y.o. female presenting for a follow up visit.  We last saw her in June or 2024 for her first evaluation due  to her anaphylactic reaction.  We did testing to tree nuts which was completely negative.  We reviewed her EpiPen use.  We obtained a nut panel as well as chocolate.  We also did an alpha gal panel which was very elevated with an IgE of 26.20 to alpha gal.  Since the last visit, she has done well.   She does not eat a lot of red meat and she has been eating some hamburgers without a problem. She is not avoiding red meat. She does have an EpiPen that was relatively affordable at $80. She was fine with the price. She did eat a beef taco on the day that she reacted, but she has eaten this before and after without a problem.   She had been eating raw peanuts without a problem. She bought some raw peanuts to feed to a chipmunk. She refers to him as Chippy. She has two dogs that are just as spoiled as the chipmunk. In any case, she has been avoiding almonds without any reactions at all. We talked to [facility] and the blood was never collected because I ordered the test after her original labs were already collected. She is fine with giving more blood today.   Asthma/Respiratory Symptom History: She had asthma fairly bad when she was younger. She had worsening SOB when the weather was so warm. She is  wondering if we can have an albuterol.  She really has not had any problems at all for years, but this summer her symptoms worsened. It has calmed down a lot since the weather got a bit cooler.   Otherwise, there have been no changes to her past medical history, surgical history, family history, or social history.    Review of systems otherwise negative other than that mentioned in the HPI.    Objective:   Blood pressure 130/80, pulse 63, temperature 98.2 F (36.8 C), resp. rate 16, height 5' (1.524 m), weight 147 lb 14.4 oz (67.1 kg), SpO2 95%. Body mass index is 28.88 kg/m.    Physical Exam Vitals reviewed.  Constitutional:      Appearance: She is well-developed.     Comments: Tremor throughout  the visit. Friendly.   HENT:     Head: Normocephalic and atraumatic.     Right Ear: Tympanic membrane, ear canal and external ear normal. No drainage, swelling or tenderness. Tympanic membrane is not injected, scarred, erythematous, retracted or bulging.     Left Ear: Tympanic membrane, ear canal and external ear normal. No drainage, swelling or tenderness. Tympanic membrane is not injected, scarred, erythematous, retracted or bulging.     Nose: No nasal deformity, septal deviation, mucosal edema or rhinorrhea.     Right Sinus: No maxillary sinus tenderness or frontal sinus tenderness.     Left Sinus: No maxillary sinus tenderness or frontal sinus tenderness.     Mouth/Throat:     Mouth: Mucous membranes are not pale and not dry.     Pharynx: Uvula midline.  Eyes:     General:        Right eye: No discharge.        Left eye: No discharge.     Conjunctiva/sclera: Conjunctivae normal.     Right eye: Right conjunctiva is not injected. No chemosis.    Left eye: Left conjunctiva is not injected. No chemosis.    Pupils: Pupils are equal, round, and reactive to light.  Cardiovascular:     Rate and Rhythm: Normal rate and regular rhythm.     Heart sounds: Normal heart sounds.  Pulmonary:     Effort: Pulmonary effort is normal. No tachypnea, accessory muscle usage or respiratory distress.     Breath sounds: Normal breath sounds. No wheezing, rhonchi or rales.  Chest:     Chest wall: No tenderness.  Lymphadenopathy:     Head:     Right side of head: No submandibular, tonsillar or occipital adenopathy.     Left side of head: No submandibular, tonsillar or occipital adenopathy.     Cervical: No cervical adenopathy.  Skin:    Coloration: Skin is not pale.     Findings: No abrasion, erythema, petechiae or rash. Rash is not papular, urticarial or vesicular.  Neurological:     Mental Status: She is alert.  Psychiatric:        Behavior: Behavior is cooperative.      Diagnostic studies:     Spirometry: results abnormal (FEV1: 1.09/63%, FVC: 1.48/66%, FEV1/FVC: 74%).    Spirometry consistent with possible restrictive disease.   Allergy Studies: none        Malachi Bonds, MD  [facility]

## 2022-12-22 NOTE — Patient Instructions (Addendum)
1. Allergic reaction - We are getting that blood work to look for the nut allergy.  - I am confused by the tolerance of the hamburgers and your very high numbers on the lab results.  - I guess it is OK to continue with eating the hamburgers since you have done fine.  - EpiPen is up to date.  - Almond allergy is VERY rare, but we will see what the blood work shows.    2. Intermittent asthma, uncomplicated - Lung testing looked fairly decent today, but not entirely normal.  - We did not give albuterol today to see if the spirometry reverses, but we can consider that in the future if needed.  - Continue with albuterol as needed for now.    3. Return in about 6 months (around 06/24/2023). You can have the follow up appointment with Dr. Dellis Anes or a Nurse Practicioner (our Nurse Practitioners are excellent and always have Physician oversight!).    Please inform us of any Emergency Department visits, hospitalizations, or changes in symptoms. Call us before going to the ED for breathing or allergy symptoms since we might be able to fit you in for a sick visit. Feel free to contact us anytime with any questions, problems, or concerns.  It was a pleasure to see you again today!  Websites that have reliable patient information: 1. American Academy of Asthma, Allergy, and Immunology: www.aaaai.org 2. Food Allergy Research and Education (FARE): foodallergy.org 3. Mothers of Asthmatics: http://www.asthmacommunitynetwork.org 4. American College of Allergy, Asthma, and Immunology: www.acaai.org   COVID-19 Vaccine Information can be found at: [email or web address].nl For questions related to vaccine distribution or appointments, please email [email or web address] or call [phone].   We realize that you might be concerned about having an allergic reaction to the COVID19 vaccines. To help with that concern, WE ARE OFFERING THE COVID19 VACCINES IN  OUR OFFICE! Ask the front desk for dates!     "Like" [place] on Facebook and Instagram for our latest updates!      A healthy democracy works best when [employer] participate! Make sure you are registered to vote! If you have moved or changed any of your contact information, you will need to get this updated before voting!  In some cases, you MAY be able to register to vote online: [email or web address]

## 2023-01-05 ENCOUNTER — Encounter: Payer: Self-pay | Admitting: Diagnostic Neuroimaging

## 2023-01-05 ENCOUNTER — Ambulatory Visit (INDEPENDENT_AMBULATORY_CARE_PROVIDER_SITE_OTHER): Payer: Medicare Other | Admitting: Diagnostic Neuroimaging

## 2023-01-05 VITALS — BP 119/66 | HR 61 | Ht 60.0 in | Wt 149.0 lb

## 2023-01-05 DIAGNOSIS — G250 Essential tremor: Secondary | ICD-10-CM

## 2023-01-05 DIAGNOSIS — G2581 Restless legs syndrome: Secondary | ICD-10-CM | POA: Diagnosis not present

## 2023-01-05 DIAGNOSIS — I10 Essential (primary) hypertension: Secondary | ICD-10-CM | POA: Diagnosis not present

## 2023-01-05 DIAGNOSIS — E039 Hypothyroidism, unspecified: Secondary | ICD-10-CM | POA: Diagnosis not present

## 2023-01-05 DIAGNOSIS — Z0000 Encounter for general adult medical examination without abnormal findings: Secondary | ICD-10-CM | POA: Diagnosis not present

## 2023-01-05 DIAGNOSIS — J4520 Mild intermittent asthma, uncomplicated: Secondary | ICD-10-CM | POA: Diagnosis not present

## 2023-01-05 DIAGNOSIS — E782 Mixed hyperlipidemia: Secondary | ICD-10-CM | POA: Diagnosis not present

## 2023-01-05 MED ORDER — PRIMIDONE 50 MG PO TABS
150.0000 mg | ORAL_TABLET | Freq: Two times a day (BID) | ORAL | 5 refills | Status: DC
Start: 2023-01-05 — End: 2023-08-02

## 2023-01-05 MED ORDER — PROPRANOLOL HCL ER 80 MG PO CP24: 80.0000 mg | ORAL_CAPSULE | Freq: Every day | ORAL | 4 refills | Status: DC

## 2023-01-05 MED ORDER — CARBIDOPA-LEVODOPA 25-100 MG PO TABS: 2.0000 | ORAL_TABLET | Freq: Every evening | ORAL | 4 refills | Status: DC

## 2023-01-05 NOTE — Progress Notes (Signed)
History of Present Illness:  UPDATE (01/05/23, VRP): Since last visit, doing well. Symptoms are stable. Severity is mild-moderate. No alleviating or aggravating factors. Tolerating meds.    UPDATE (10/27/22, VRP): Since last visit, doing better on carb/levo. However more postural / action tremor in last few months. Balance is stable.   UPDATE (12/30/21, VRP): Since last visit, doing well. Had T12-L1 surgery in June 2023.  Parkinson symptoms are stable. No alleviating or aggravating factors. Tolerating meds.    UPDATE (07/01/21, VRP): Since last visit, doing well, except at she reduced carb/levo on her own (inadvertantly) and now tremors worse.   UPDATE (12/31/20, VRP): Since last visit, doing about the same. Resting tremor and balance stable. Postural tremor more bothersome. Taking 1-2 tabs carb/levo; three times a day; nausea limits dosing.   UPDATE (12/26/19, VRP): Since last visit, doing well. Symptoms are slightly progressed. Severity mild-mod. No alleviating or aggravating factors. Tolerating meds. Taking carb/levo 3-4 tabs per day. Tremor slightly increased. Balance stable.  UPDATE (12/20/18, VRP): Since last visit, doing well. Symptoms are stable. Severity is mild. No alleviating or aggravating factors. Tolerating meds. Some afternoon tremors are noticeable. Has had some falls.   UPDATE (03/21/18, VRP): Since last visit, doing more head tremor issues in last 3-4 months. Symptoms are progressive and mild. No alleviating or aggravating factors. Tolerating meds.    UPDATE (04/05/17, VRP): Since last visit, doing well. Tolerating meds. No alleviating or aggravating factors. Carb/levo is helping. Patient taking twice a day.   UPDATE 09/29/16: Since last visit, doing well. Carb/levo helping tremor control. Mainly taking twice a day. No other new symptoms.  UPDATE 06/23/16: Since last visit no change in sxs. MRI reviewed. Ready to start carb/levo.   PRIOR HPI (03/11/16): 77 year old female here  for evaluation of tremor. Patient is right handed and has hypertension. For past 8-10 years, patient has had intermittent postural and action tremor. This is progressively worsened over time. More recently she has noticed subtle resting tremor. Patient is having more difficulty with holding objects, drinking from a cup, handwriting. Symptoms fluctuate from day to day. She has a glass of wine sometimes this calms down some of her tremor. Patient has been on propranolol for blood pressure control, and was advised to take an extra tablet of this to help with tremor control. However patient has not tried taking the extra propranolol. Patient has significant family history for tremor. Patient's mother had tremor but not specifically diagnosed. Patient's mother also had polycythemia vera and later leukemia. Patient's son has tremor starting at age 32 years old. Patient's maternal aunts 2 and maternal uncle 1 at Parkinson's disease. Several first cousins on her mother's side also have Parkinson's disease. A different maternal uncle had multiple sclerosis. Patient also has noted some hoarse voice, intermittent choking sensation, mild drooling from the right side of her mouth. No change in smell or taste. No significant anxiety or constipation. No leg tremor. She has some mild balance and walking difficulty.  Patient has had multiple cervical spine surgeries in 2003, 2007 in 2014. 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 She is also had lumbar spine surgeries in 2001 and 2014. Patient had some complication of surgery including hoarse voice.  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 Conventional angiogram was performed which showed tortuous right ICA without evidence of dissection.      Observations/Objective:  GENERAL EXAM/CONSTITUTIONAL: Vitals:  Vitals:   01/05/23 1529  BP: 119/66  Pulse: 61   Weight: 149 lb (67.6 kg)  Height: 5' (1.524 m)   Body mass index is 29.1 kg/m. Wt Readings from Last 3 Encounters:  01/05/23 149 lb (67.6 kg)  12/22/22 147 lb 14.4 oz (67.1 kg)  11/05/22 148 lb 8 oz (67.4 kg)   Patient is in no distress; well developed, nourished and groomed; neck is supple  CARDIOVASCULAR: Examination of carotid arteries is normal; no carotid bruits Regular rate and rhythm, no murmurs Examination of peripheral vascular system by observation and palpation is normal  EYES: Ophthalmoscopic exam of optic discs and posterior segments is normal; no papilledema or hemorrhages No results found.  MUSCULOSKELETAL: Gait, strength, tone, movements noted in Neurologic exam below  NEUROLOGIC: MENTAL STATUS:      No data to display         awake, alert, oriented to person, place and time recent and remote memory intact normal attention and concentration language fluent, comprehension intact, naming intact fund of knowledge appropriate  CRANIAL NERVE:  2nd - no papilledema on fundoscopic exam 2nd, 3rd, 4th, 6th - pupils equal and reactive to light, visual fields full to confrontation, extraocular muscles intact, no nystagmus 5th - facial sensation symmetric 7th - facial strength symmetric 8th - hearing intact 9th - palate elevates symmetrically, uvula midline 11th - shoulder shrug symmetric 12th - tongue protrusion midline VOICE TREMOR; HEAD TREMOR  MOTOR:  MILD POSTURAL AND ACTION TREMOR IN BUE NO COGWHEELING; NO BRADYKINESIA normal bulk and tone, full strength in the BUE, BLE  SENSORY:  normal and symmetric to light touch, temperature, vibration  COORDINATION:  finger-nose-finger, fine finger movements normal  REFLEXES:  deep tendon reflexes TRACE and symmetric  GAIT/STATION:  narrow based gait; GOOD STRIDE AND ARM SWING    01/17/21 DATscan Normal Ioflupane scan. No reduced radiotracer activity in basal ganglia to suggest Parkinson's syndrome  pathology.   Assessment and Plan:  Dx:  No diagnosis found.     PLAN:  RLS  - continue carb/levo 2 tabs in the evening for RLS   ESSENTIAL TREMOR - continue propranolol 80mg  daily for essential tremor and BP control - continue primidone up to 100mg  twice a day   ? 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    Dispense:  180 tablet    Refill:  4   primidone (MYSOLINE) 50 MG tablet    Sig: Take 3 tablets (150 mg total) by mouth in the morning and at bedtime.    Dispense:  180 tablet    Refill:  5   propranolol ER (INDERAL LA) 80 MG 24 hr capsule    Sig: Take 1 capsule (80 mg total) by mouth daily.    Dispense:  90 capsule    Refill:  4    Follow Up Instructions:  - Return in about 1 year (around 01/05/2024).     Suanne Marker, MD 01/05/2023, 3:42 PM Certified in Neurology, Neurophysiology and Neuroimaging  [facility] [address] [place], [place] [address] [phone]

## 2023-01-10 ENCOUNTER — Encounter (HOSPITAL_BASED_OUTPATIENT_CLINIC_OR_DEPARTMENT_OTHER): Payer: Self-pay | Admitting: *Deleted

## 2023-01-10 ENCOUNTER — Other Ambulatory Visit: Payer: Self-pay

## 2023-01-10 ENCOUNTER — Emergency Department (HOSPITAL_BASED_OUTPATIENT_CLINIC_OR_DEPARTMENT_OTHER): Payer: Medicare Other

## 2023-01-10 ENCOUNTER — Emergency Department (HOSPITAL_BASED_OUTPATIENT_CLINIC_OR_DEPARTMENT_OTHER)
Admission: EM | Admit: 2023-01-10 | Discharge: 2023-01-10 | Disposition: A | Discharge status: Home / Self Care | Payer: Medicare Other | Attending: Emergency Medicine | Admitting: Emergency Medicine

## 2023-01-10 DIAGNOSIS — W19XXXA Unspecified fall, initial encounter: Secondary | ICD-10-CM | POA: Insufficient documentation

## 2023-01-10 DIAGNOSIS — M62838 Other muscle spasm: Secondary | ICD-10-CM | POA: Diagnosis not present

## 2023-01-10 DIAGNOSIS — M47812 Spondylosis without myelopathy or radiculopathy, cervical region: Secondary | ICD-10-CM | POA: Insufficient documentation

## 2023-01-10 DIAGNOSIS — Z981 Arthrodesis status: Secondary | ICD-10-CM | POA: Insufficient documentation

## 2023-01-10 DIAGNOSIS — S81011A Laceration without foreign body, right knee, initial encounter: Secondary | ICD-10-CM | POA: Diagnosis not present

## 2023-01-10 DIAGNOSIS — S0990XA Unspecified injury of head, initial encounter: Secondary | ICD-10-CM | POA: Insufficient documentation

## 2023-01-10 DIAGNOSIS — R519 Headache, unspecified: Secondary | ICD-10-CM | POA: Diagnosis not present

## 2023-01-10 MED ORDER — LIDOCAINE 5 % EX PTCH: 1.0000 | MEDICATED_PATCH | CUTANEOUS | 0 refills | Status: DC

## 2023-01-10 MED ORDER — CYCLOBENZAPRINE HCL 5 MG PO TABS: 5.0000 mg | ORAL_TABLET | Freq: Two times a day (BID) | ORAL | 0 refills | Status: DC | PRN

## 2023-01-10 MED ORDER — CYCLOBENZAPRINE HCL 5 MG PO TABS
5.0000 mg | ORAL_TABLET | Freq: Once | ORAL | Status: AC
  Administered 2023-01-10: 5 mg via ORAL
  Filled 2023-01-10: qty 1

## 2023-01-10 MED ORDER — LIDOCAINE 5 % EX PTCH
1.0000 | MEDICATED_PATCH | CUTANEOUS | Status: DC
Start: 2023-01-10 — End: 2023-01-11
  Administered 2023-01-10: 1 via TRANSDERMAL
  Filled 2023-01-10: qty 1

## 2023-01-10 NOTE — ED Notes (Signed)
To CT scan

## 2023-01-10 NOTE — ED Triage Notes (Signed)
Pt states that she tripped and fell this morning at church. Denies loc. States she landed more on her left side but the right side of her neck is hurting her the most. Pain with movement. Not sure if she hit her head or not. Pt is not on blood thinners. Took tylenol just pta and had had ice on her neck.

## 2023-01-10 NOTE — ED Provider Notes (Signed)
[place] [facility] AT [facility] Provider Note   CSN: [id number] Arrival date & time: 01/10/23  1929     History  Chief Complaint  Patient presents with   Lenord Fellers is a 77 y.o. female.  Patient here with right-sided neck neck spasm after fall earlier today.  She fell while at church today.  Landed on her left side but now having some spasms on the right side of her neck.  Did not lose consciousness.  She thinks he might of hit her head.  She has a skin tear to her right knee that she is taking care of.  Is not having any major pain in her knees.  Denies any chest pain or shortness of breath.  Not on blood thinners.  Nothing makes it worse or better.  Denies any numbness tingling weakness.  The history is provided by the patient.       Home Medications Prior to Admission medications   Medication Sig Start Date End Date Taking? Authorizing Provider  cyclobenzaprine (FLEXERIL) 5 MG tablet Take 1 tablet (5 mg total) by mouth 2 (two) times daily as needed for up to 15 doses for muscle spasms. 01/10/23  Yes Shirl Weir, DO  lidocaine (LIDODERM) 5 % Place 1 patch onto the skin daily. Remove & Discard patch within 12 hours or as directed by MD 01/10/23  Yes Cederick Broadnax, DO  acetaminophen (TYLENOL) 500 MG tablet Take 1,000 mg by mouth daily as needed for moderate pain or headache.    [provider]  albuterol (VENTOLIN HFA) 108 (90 Base) MCG/ACT inhaler Inhale 2 puffs into the lungs every 4 (four) hours as needed. 12/22/22   Alfonse Spruce, MD  Calcium Carb-Cholecalciferol (CALCIUM 500 + D) 500-3.125 MG-MCG TABS Take 5,000 mg by mouth 1 day or 1 dose. 07/16/16   [provider]  carbidopa-levodopa (SINEMET IR) 25-100 MG tablet Take 2 tablets by mouth every evening. 01/05/23   Penumalli, Glenford Bayley, MD  Cholecalciferol (VITAMIN D3) 5000 units CAPS Take 5,000 Units by mouth daily.    [provider]  EPINEPHrine 0.3 mg/0.3 mL  IJ SOAJ injection Inject 0.3 mg into the muscle as needed for anaphylaxis. 09/20/22   Kommor, Madison, MD  losartan (COZAAR) 100 MG tablet Take 100 mg by mouth in the morning. 03/10/16   [provider]  primidone (MYSOLINE) 50 MG tablet Take 3 tablets (150 mg total) by mouth in the morning and at bedtime. 01/05/23   Penumalli, Glenford Bayley, MD  propranolol ER (INDERAL LA) 80 MG 24 hr capsule Take 1 capsule (80 mg total) by mouth daily. 01/05/23   Penumalli, Glenford Bayley, MD  SYNTHROID 88 MCG tablet Take 88 mcg by mouth daily before breakfast. 03/03/16   [provider]  venlafaxine XR (EFFEXOR-XR) 37.5 MG 24 hr capsule Take 37.5 mg by mouth every evening.  03/29/16   [provider]      Allergies    Compazine [prochlorperazine edisylate] and Prochlorperazine    Review of Systems   Review of Systems  Physical Exam Updated Vital Signs Pulse 64   Temp 98.3 F (36.8 C) (Oral)   Resp 20   Ht 5\' 8"  (1.727 m)   Wt 67.6 kg   SpO2 96%   BMI 22.66 kg/m  Physical Exam Vitals and nursing note reviewed.  Constitutional:      General: She is not in acute distress.    Appearance: She is well-developed. She  is not ill-appearing.  HENT:     Head: Normocephalic and atraumatic.     Nose: Nose normal.     Mouth/Throat:     Mouth: Mucous membranes are moist.  Eyes:     Extraocular Movements: Extraocular movements intact.     Conjunctiva/sclera: Conjunctivae normal.     Pupils: Pupils are equal, round, and reactive to light.  Neck:     Comments: No midline spinal tenderness, tenderness to the paraspinal cervical muscles on the right Cardiovascular:     Rate and Rhythm: Normal rate and regular rhythm.     Pulses: Normal pulses.     Heart sounds: No murmur heard. Pulmonary:     Effort: Pulmonary effort is normal. No respiratory distress.     Breath sounds: Normal breath sounds.  Abdominal:     Palpations: Abdomen is soft.     Tenderness: There is no abdominal tenderness.   Musculoskeletal:        General: No swelling or tenderness. Normal range of motion.     Cervical back: Neck supple.     Comments: No midline spinal tenderness, no tenderness to the extremities  Skin:    General: Skin is warm and dry.     Capillary Refill: Capillary refill takes less than 2 seconds.     Comments: Skin tear to left knee/thigh  Neurological:     General: No focal deficit present.     Mental Status: She is alert and oriented to person, place, and time.     Cranial Nerves: No cranial nerve deficit.     Sensory: No sensory deficit.     Motor: No weakness.     Coordination: Coordination normal.     Comments: 5+ out of 5 strength throughout, normal sensation, no drift, normal finger-nose-finger, normal speech  Psychiatric:        Mood and Affect: Mood normal.     ED Results / Procedures / Treatments   Labs (all labs ordered are listed, but only abnormal results are displayed) Labs Reviewed - No data to display  EKG None  Radiology CT Head Wo Contrast  Result Date: 01/10/2023 CLINICAL DATA:  Polytrauma, blunt.  Fall with right neck pain. EXAM: CT HEAD WITHOUT CONTRAST CT CERVICAL SPINE WITHOUT CONTRAST TECHNIQUE: Multidetector CT imaging of the head and cervical spine was performed following the standard protocol without intravenous contrast. Multiplanar CT image reconstructions of the cervical spine were also generated. RADIATION DOSE REDUCTION: This exam was performed according to the departmental dose-optimization program which includes automated exposure control, adjustment of the mA and/or kV according to patient size and/or use of iterative reconstruction technique. COMPARISON:  None Available. FINDINGS: CT HEAD FINDINGS Brain: No acute intracranial hemorrhage, midline shift or mass effect. No extra-axial fluid collection. Gray-white matter differentiation is within normal limits. No hydrocephalus. Vascular: No hyperdense vessel or unexpected calcification. Skull:  Normal. Negative for fracture or focal lesion. Sinuses/Orbits: There is a complete opacification of the right maxillary sinus with mucoperiosteal elevation. No acute orbital abnormality. Other: None. CT CERVICAL SPINE FINDINGS Alignment: Normal. Skull base and vertebrae: No acute fracture. There is anterior fusion of the C4 through T1 vertebral bodies without evidence of hardware loosening. 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 Soft tissues and spinal canal: No prevertebral fluid or swelling. No visible canal hematoma. Disc levels: Multilevel facet arthropathy and degenerative disc disease. Upper chest: No acute abnormality. Other: None. IMPRESSION: 1. No acute intracranial process. 2. Multilevel degenerative changes  and anterior cervical spinal fusion hardware from C4-T1. Electronically Signed   By: Thornell Sartorius M.D.   On: 01/10/2023 20:55   CT Cervical Spine Wo Contrast  Result Date: 01/10/2023 CLINICAL DATA:  Polytrauma, blunt.  Fall with right neck pain. EXAM: CT HEAD WITHOUT CONTRAST CT CERVICAL SPINE WITHOUT CONTRAST TECHNIQUE: Multidetector CT imaging of the head and cervical spine was performed following the standard protocol without intravenous contrast. Multiplanar CT image reconstructions of the cervical spine were also generated. RADIATION DOSE REDUCTION: This exam was performed according to the departmental dose-optimization program which includes automated exposure control, adjustment of the mA and/or kV according to patient size and/or use of iterative reconstruction technique. COMPARISON:  None Available. FINDINGS: CT HEAD FINDINGS Brain: No acute intracranial hemorrhage, midline shift or mass effect. No extra-axial fluid collection. Gray-white matter differentiation is within normal limits. No hydrocephalus. Vascular: No hyperdense vessel or unexpected calcification. Skull: Normal. Negative for fracture or focal lesion. Sinuses/Orbits: There is a complete  opacification of the right maxillary sinus with mucoperiosteal elevation. No acute orbital abnormality. Other: None. CT CERVICAL SPINE FINDINGS Alignment: Normal. Skull base and vertebrae: No acute fracture. There is anterior fusion of the C4 through T1 vertebral bodies without evidence of hardware loosening. 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 Soft tissues and spinal canal: No prevertebral fluid or swelling. No visible canal hematoma. Disc levels: Multilevel facet arthropathy and degenerative disc disease. Upper chest: No acute abnormality. Other: None. IMPRESSION: 1. No acute intracranial process. 2. Multilevel degenerative changes and anterior cervical spinal fusion hardware from C4-T1. Electronically Signed   By: Thornell Sartorius M.D.   On: 01/10/2023 20:55    Procedures Procedures    Medications Ordered in ED Medications  lidocaine (LIDODERM) 5 % 1 patch (1 patch Transdermal Patch Applied 01/10/23 2040)  cyclobenzaprine (FLEXERIL) tablet 5 mg (5 mg Oral Given 01/10/23 2040)    ED Course/ Medical Decision Making/ A&P                                 Medical Decision Making Amount and/or Complexity of Data Reviewed Radiology: ordered.  Risk Prescription drug management.   Theora Gianotti Vogelgesang is here with neck pain after fall earlier this afternoon.  Skin tear to the left knee.  Not on blood thinners.  No major medical problems  Started to develop some right-sided neck spasms here several hours after fall.  Did not lose consciousness.  Overall tender to the paraspinal muscles on the right.  Normal neurological exam.  She is not having any knee tenderness.  She did not want any x-ray which I think is reasonable.  Will get a head and neck CT which were unremarkable per radiology report.  Overall suspect muscle spasm.  She is neurovascular neuromuscular intact.  Hardware from previous fusions intact.  She is not having any radicular symptoms or cord compression  symptoms.  Recommend lidocaine patch, Flexeril which was provided in the ED.  Recommend Tylenol.  Recommend rest.  She understands return precautions.  Recommend close follow-up with primary care doctor for further pain management and reevaluation.  Patient discharged in good condition.  This chart was dictated using voice recognition software.  Despite best efforts to proofread,  errors can occur which can change the documentation meaning.           Final Clinical Impression(s) / ED Diagnoses Final diagnoses:  Neck muscle  spasm    Rx / DC Orders ED Discharge Orders          Ordered    cyclobenzaprine (FLEXERIL) 5 MG tablet  2 times daily PRN        01/10/23 2045    lidocaine (LIDODERM) 5 %  Every 24 hours        01/10/23 2045              Virgina Norfolk, DO 01/10/23 2115

## 2023-01-10 NOTE — ED Notes (Signed)
Pt has a skin tear to her left knee. Wound cleaned with wound spray and bacitracin ointment placed and dressing placed. Skin tear to left hand cleaned and a bandaid appplied. Pt tolerated well.

## 2023-01-10 NOTE — Discharge Instructions (Signed)
Take Flexeril as needed for muscle spasm.  This medication is sedating so please be careful with its use.  No driving or dangerous activities.  Recommend lidocaine patches either prescription or over-the-counter.  Recommend Tylenol as well for pain.  Recommend ice.  Follow-up with your primary care doctor for further pain management.  Return if symptoms worsen.

## 2023-01-12 DIAGNOSIS — G250 Essential tremor: Secondary | ICD-10-CM | POA: Diagnosis not present

## 2023-01-12 DIAGNOSIS — Z23 Encounter for immunization: Secondary | ICD-10-CM | POA: Diagnosis not present

## 2023-01-12 DIAGNOSIS — J4520 Mild intermittent asthma, uncomplicated: Secondary | ICD-10-CM | POA: Diagnosis not present

## 2023-01-12 DIAGNOSIS — N182 Chronic kidney disease, stage 2 (mild): Secondary | ICD-10-CM | POA: Diagnosis not present

## 2023-01-12 DIAGNOSIS — E039 Hypothyroidism, unspecified: Secondary | ICD-10-CM | POA: Diagnosis not present

## 2023-01-12 DIAGNOSIS — E782 Mixed hyperlipidemia: Secondary | ICD-10-CM | POA: Diagnosis not present

## 2023-01-12 DIAGNOSIS — I10 Essential (primary) hypertension: Secondary | ICD-10-CM | POA: Diagnosis not present

## 2023-01-19 DIAGNOSIS — L718 Other rosacea: Secondary | ICD-10-CM | POA: Diagnosis not present

## 2023-01-19 DIAGNOSIS — L570 Actinic keratosis: Secondary | ICD-10-CM | POA: Diagnosis not present

## 2023-01-19 DIAGNOSIS — C44622 Squamous cell carcinoma of skin of right upper limb, including shoulder: Secondary | ICD-10-CM | POA: Diagnosis not present

## 2023-01-19 DIAGNOSIS — L820 Inflamed seborrheic keratosis: Secondary | ICD-10-CM | POA: Diagnosis not present

## 2023-01-19 DIAGNOSIS — L918 Other hypertrophic disorders of the skin: Secondary | ICD-10-CM | POA: Diagnosis not present

## 2023-01-19 DIAGNOSIS — L720 Epidermal cyst: Secondary | ICD-10-CM | POA: Diagnosis not present

## 2023-01-19 DIAGNOSIS — D485 Neoplasm of uncertain behavior of skin: Secondary | ICD-10-CM | POA: Diagnosis not present

## 2023-01-19 DIAGNOSIS — Z85828 Personal history of other malignant neoplasm of skin: Secondary | ICD-10-CM | POA: Diagnosis not present

## 2023-01-19 DIAGNOSIS — L300 Nummular dermatitis: Secondary | ICD-10-CM | POA: Diagnosis not present

## 2023-01-19 DIAGNOSIS — D225 Melanocytic nevi of trunk: Secondary | ICD-10-CM | POA: Diagnosis not present

## 2023-01-19 DIAGNOSIS — D2239 Melanocytic nevi of other parts of face: Secondary | ICD-10-CM | POA: Diagnosis not present

## 2023-01-19 DIAGNOSIS — L821 Other seborrheic keratosis: Secondary | ICD-10-CM | POA: Diagnosis not present

## 2023-01-25 ENCOUNTER — Telehealth: Payer: Self-pay

## 2023-01-25 NOTE — Telephone Encounter (Signed)
Transition Care Management Follow-up Telephone Call Date of discharge and from where: 01/10/2023 [facility] How have you been since you were released from the hospital? Patient stated she is feeling better. Any questions or concerns? No  Items Reviewed: Did the pt receive and understand the discharge instructions provided? Yes  Medications obtained and verified? Yes  Other? No  Any new allergies since your discharge? No  Dietary orders reviewed? Yes Do you have support at home? Yes   Follow up appointments reviewed:  PCP Hospital f/u appt confirmed? No  Scheduled to see  on  @ . Specialist Hospital f/u appt confirmed? Yes  Scheduled to see Hetty Blend, FNP on 02/08/2023 @ [facility]. Are transportation arrangements needed? No  If their condition worsens, is the pt aware to call PCP or go to the Emergency Dept.? Yes Was the patient provided with contact information for the PCP's office or ED? Yes Was to pt encouraged to call back with questions or concerns? Yes  Tyrus Wilms [facility]  [facility] Population Health Community Resource Care Guide   [email or web address]  ?? [phone]   Website: [email or web address]

## 2023-02-05 DIAGNOSIS — M542 Cervicalgia: Secondary | ICD-10-CM | POA: Diagnosis not present

## 2023-02-05 DIAGNOSIS — M25512 Pain in left shoulder: Secondary | ICD-10-CM | POA: Diagnosis not present

## 2023-02-08 ENCOUNTER — Encounter: Payer: Self-pay | Admitting: Family Medicine

## 2023-02-08 ENCOUNTER — Ambulatory Visit (INDEPENDENT_AMBULATORY_CARE_PROVIDER_SITE_OTHER): Payer: Medicare Other | Admitting: Family Medicine

## 2023-02-08 VITALS — BP 116/60 | HR 70 | Temp 98.2°F | Resp 12 | Wt 148.8 lb

## 2023-02-08 DIAGNOSIS — Z91018 Allergy to other foods: Secondary | ICD-10-CM

## 2023-02-08 NOTE — Progress Notes (Signed)
[address] [place] [address] Dept: [phone]  FOLLOW UP NOTE  Patient ID: Jeanette [name], female    DOB: 09-Dec-1945  Age: 77 y.o. MRN: [id number] Date of Office Visit: 02/08/2023  Assessment  Chief Complaint: Food/Drug Challenge (Mixed tree nuts. Patient is currently on prednisone. )  HPI Jeanette [name] is a 77 year old female who presents to the clinic for follow-up visit.  She was last seen in this clinic on 12/22/2022 by Dr. 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  Patient reports that she began a 10 day course of prednisone for a neck injury on Saturday. She will reschedule for a time when she is not on a steroid and is feeling well.   Drug Allergies:  Allergies  Allergen Reactions   Compazine [Prochlorperazine Edisylate] Anaphylaxis and Swelling    Mouth and tongue swell, throat swells closed   Prochlorperazine Other (See Comments)    Physical Exam: BP 116/60   Pulse 70   Temp 98.2 F (36.8 C) (Temporal)   Resp 12   Wt 148 lb 12.8 oz (67.5 kg)   SpO2 96%   BMI 22.62 kg/m      Assessment and Plan: 1. Food allergy     Patient Instructions  Food allergy  Continue to avoid tree nuts. Return to the clinic for tree nut challenge when you are feeling well and are not taking prednisone or antihistamines  In case of an allergic reaction, take Benadryl 50 mg every 4 hours, and if life-threatening symptoms occur, inject with EpiPen 0.3 mg.   Follow up in about 4 weeks or sooner if needed.   Return in about 3 weeks (around 03/01/2023), or if symptoms worsen or fail to improve.    Thank you for the opportunity to care for this patient.  Please do not hesitate to contact me with questions.  Thermon Leyland, FNP [facility]

## 2023-02-08 NOTE — Patient Instructions (Addendum)
Food allergy  Continue to avoid tree nuts. Return to the clinic for tree nut challenge when you are feeling well and are not taking prednisone or antihistamines  In case of an allergic reaction, take Benadryl 50 mg every 4 hours, and if life-threatening symptoms occur, inject with EpiPen 0.3 mg.   Follow up in about 4 weeks or sooner if needed.

## 2023-02-18 DIAGNOSIS — J320 Chronic maxillary sinusitis: Secondary | ICD-10-CM | POA: Diagnosis not present

## 2023-02-18 DIAGNOSIS — J40 Bronchitis, not specified as acute or chronic: Secondary | ICD-10-CM | POA: Diagnosis not present

## 2023-03-02 ENCOUNTER — Ambulatory Visit: Payer: Medicare Other | Admitting: Diagnostic Neuroimaging

## 2023-03-08 DIAGNOSIS — M25512 Pain in left shoulder: Secondary | ICD-10-CM | POA: Diagnosis not present

## 2023-03-11 DIAGNOSIS — M25512 Pain in left shoulder: Secondary | ICD-10-CM | POA: Diagnosis not present

## 2023-03-22 DIAGNOSIS — Z1231 Encounter for screening mammogram for malignant neoplasm of breast: Secondary | ICD-10-CM | POA: Diagnosis not present

## 2023-03-26 DIAGNOSIS — M19012 Primary osteoarthritis, left shoulder: Secondary | ICD-10-CM | POA: Diagnosis not present

## 2023-04-05 DIAGNOSIS — M25512 Pain in left shoulder: Secondary | ICD-10-CM | POA: Diagnosis not present

## 2023-04-12 DIAGNOSIS — Z01818 Encounter for other preprocedural examination: Secondary | ICD-10-CM | POA: Diagnosis not present

## 2023-04-12 DIAGNOSIS — M722 Plantar fascial fibromatosis: Secondary | ICD-10-CM | POA: Diagnosis not present

## 2023-04-16 DIAGNOSIS — N182 Chronic kidney disease, stage 2 (mild): Secondary | ICD-10-CM | POA: Diagnosis not present

## 2023-04-16 DIAGNOSIS — I10 Essential (primary) hypertension: Secondary | ICD-10-CM | POA: Diagnosis not present

## 2023-04-16 DIAGNOSIS — E039 Hypothyroidism, unspecified: Secondary | ICD-10-CM | POA: Diagnosis not present

## 2023-04-16 DIAGNOSIS — E782 Mixed hyperlipidemia: Secondary | ICD-10-CM | POA: Diagnosis not present

## 2023-04-16 DIAGNOSIS — J4520 Mild intermittent asthma, uncomplicated: Secondary | ICD-10-CM | POA: Diagnosis not present

## 2023-04-16 DIAGNOSIS — Z01818 Encounter for other preprocedural examination: Secondary | ICD-10-CM | POA: Diagnosis not present

## 2023-04-16 DIAGNOSIS — G250 Essential tremor: Secondary | ICD-10-CM | POA: Diagnosis not present

## 2023-04-20 DIAGNOSIS — Z85828 Personal history of other malignant neoplasm of skin: Secondary | ICD-10-CM | POA: Diagnosis not present

## 2023-04-20 DIAGNOSIS — L570 Actinic keratosis: Secondary | ICD-10-CM | POA: Diagnosis not present

## 2023-04-20 DIAGNOSIS — D485 Neoplasm of uncertain behavior of skin: Secondary | ICD-10-CM | POA: Diagnosis not present

## 2023-04-20 DIAGNOSIS — L720 Epidermal cyst: Secondary | ICD-10-CM | POA: Diagnosis not present

## 2023-04-20 DIAGNOSIS — L821 Other seborrheic keratosis: Secondary | ICD-10-CM | POA: Diagnosis not present

## 2023-04-20 DIAGNOSIS — C44319 Basal cell carcinoma of skin of other parts of face: Secondary | ICD-10-CM | POA: Diagnosis not present

## 2023-04-20 DIAGNOSIS — L718 Other rosacea: Secondary | ICD-10-CM | POA: Diagnosis not present

## 2023-05-03 DIAGNOSIS — C44319 Basal cell carcinoma of skin of other parts of face: Secondary | ICD-10-CM | POA: Diagnosis not present

## 2023-05-03 DIAGNOSIS — Z85828 Personal history of other malignant neoplasm of skin: Secondary | ICD-10-CM | POA: Diagnosis not present

## 2023-06-03 DIAGNOSIS — G8918 Other acute postprocedural pain: Secondary | ICD-10-CM | POA: Diagnosis not present

## 2023-06-03 DIAGNOSIS — M19012 Primary osteoarthritis, left shoulder: Secondary | ICD-10-CM | POA: Diagnosis not present

## 2023-06-03 DIAGNOSIS — M25712 Osteophyte, left shoulder: Secondary | ICD-10-CM | POA: Diagnosis not present

## 2023-06-07 DIAGNOSIS — M25512 Pain in left shoulder: Secondary | ICD-10-CM | POA: Diagnosis not present

## 2023-06-10 DIAGNOSIS — M25512 Pain in left shoulder: Secondary | ICD-10-CM | POA: Diagnosis not present

## 2023-06-14 DIAGNOSIS — M25512 Pain in left shoulder: Secondary | ICD-10-CM | POA: Diagnosis not present

## 2023-06-17 DIAGNOSIS — M25512 Pain in left shoulder: Secondary | ICD-10-CM | POA: Diagnosis not present

## 2023-06-18 DIAGNOSIS — M19012 Primary osteoarthritis, left shoulder: Secondary | ICD-10-CM | POA: Diagnosis not present

## 2023-06-21 DIAGNOSIS — M25512 Pain in left shoulder: Secondary | ICD-10-CM | POA: Diagnosis not present

## 2023-06-22 ENCOUNTER — Ambulatory Visit: Payer: Medicare Other | Admitting: Allergy & Immunology

## 2023-06-22 DIAGNOSIS — C44722 Squamous cell carcinoma of skin of right lower limb, including hip: Secondary | ICD-10-CM | POA: Diagnosis not present

## 2023-06-22 DIAGNOSIS — Z85828 Personal history of other malignant neoplasm of skin: Secondary | ICD-10-CM | POA: Diagnosis not present

## 2023-06-22 DIAGNOSIS — D485 Neoplasm of uncertain behavior of skin: Secondary | ICD-10-CM | POA: Diagnosis not present

## 2023-06-24 DIAGNOSIS — M25512 Pain in left shoulder: Secondary | ICD-10-CM | POA: Diagnosis not present

## 2023-06-28 DIAGNOSIS — M25512 Pain in left shoulder: Secondary | ICD-10-CM | POA: Diagnosis not present

## 2023-07-01 DIAGNOSIS — M25512 Pain in left shoulder: Secondary | ICD-10-CM | POA: Diagnosis not present

## 2023-07-05 DIAGNOSIS — M25512 Pain in left shoulder: Secondary | ICD-10-CM | POA: Diagnosis not present

## 2023-07-08 DIAGNOSIS — M25512 Pain in left shoulder: Secondary | ICD-10-CM | POA: Diagnosis not present

## 2023-07-12 DIAGNOSIS — M25512 Pain in left shoulder: Secondary | ICD-10-CM | POA: Diagnosis not present

## 2023-07-15 DIAGNOSIS — M25512 Pain in left shoulder: Secondary | ICD-10-CM | POA: Diagnosis not present

## 2023-07-19 DIAGNOSIS — M76821 Posterior tibial tendinitis, right leg: Secondary | ICD-10-CM | POA: Diagnosis not present

## 2023-07-21 DIAGNOSIS — M25512 Pain in left shoulder: Secondary | ICD-10-CM | POA: Diagnosis not present

## 2023-07-23 DIAGNOSIS — M25512 Pain in left shoulder: Secondary | ICD-10-CM | POA: Diagnosis not present

## 2023-07-27 DIAGNOSIS — M25512 Pain in left shoulder: Secondary | ICD-10-CM | POA: Diagnosis not present

## 2023-07-27 DIAGNOSIS — N182 Chronic kidney disease, stage 2 (mild): Secondary | ICD-10-CM | POA: Diagnosis not present

## 2023-07-27 DIAGNOSIS — E782 Mixed hyperlipidemia: Secondary | ICD-10-CM | POA: Diagnosis not present

## 2023-07-30 DIAGNOSIS — M25512 Pain in left shoulder: Secondary | ICD-10-CM | POA: Diagnosis not present

## 2023-08-02 ENCOUNTER — Other Ambulatory Visit: Payer: Self-pay | Admitting: Diagnostic Neuroimaging

## 2023-08-03 DIAGNOSIS — E039 Hypothyroidism, unspecified: Secondary | ICD-10-CM | POA: Diagnosis not present

## 2023-08-03 DIAGNOSIS — N182 Chronic kidney disease, stage 2 (mild): Secondary | ICD-10-CM | POA: Diagnosis not present

## 2023-08-03 DIAGNOSIS — M25512 Pain in left shoulder: Secondary | ICD-10-CM | POA: Diagnosis not present

## 2023-08-03 DIAGNOSIS — I10 Essential (primary) hypertension: Secondary | ICD-10-CM | POA: Diagnosis not present

## 2023-08-03 DIAGNOSIS — J4520 Mild intermittent asthma, uncomplicated: Secondary | ICD-10-CM | POA: Diagnosis not present

## 2023-08-03 DIAGNOSIS — Z23 Encounter for immunization: Secondary | ICD-10-CM | POA: Diagnosis not present

## 2023-08-03 DIAGNOSIS — E782 Mixed hyperlipidemia: Secondary | ICD-10-CM | POA: Diagnosis not present

## 2023-08-03 DIAGNOSIS — G250 Essential tremor: Secondary | ICD-10-CM | POA: Diagnosis not present

## 2023-08-05 DIAGNOSIS — M25512 Pain in left shoulder: Secondary | ICD-10-CM | POA: Diagnosis not present

## 2023-08-10 DIAGNOSIS — M25612 Stiffness of left shoulder, not elsewhere classified: Secondary | ICD-10-CM | POA: Diagnosis not present

## 2023-08-10 DIAGNOSIS — M6281 Muscle weakness (generalized): Secondary | ICD-10-CM | POA: Diagnosis not present

## 2023-08-10 DIAGNOSIS — Z96612 Presence of left artificial shoulder joint: Secondary | ICD-10-CM | POA: Diagnosis not present

## 2023-08-10 DIAGNOSIS — M25512 Pain in left shoulder: Secondary | ICD-10-CM | POA: Diagnosis not present

## 2023-08-13 DIAGNOSIS — M25512 Pain in left shoulder: Secondary | ICD-10-CM | POA: Diagnosis not present

## 2023-08-13 DIAGNOSIS — M6281 Muscle weakness (generalized): Secondary | ICD-10-CM | POA: Diagnosis not present

## 2023-08-13 DIAGNOSIS — M25612 Stiffness of left shoulder, not elsewhere classified: Secondary | ICD-10-CM | POA: Diagnosis not present

## 2023-08-13 DIAGNOSIS — Z96612 Presence of left artificial shoulder joint: Secondary | ICD-10-CM | POA: Diagnosis not present

## 2023-08-16 DIAGNOSIS — M76821 Posterior tibial tendinitis, right leg: Secondary | ICD-10-CM | POA: Diagnosis not present

## 2023-08-16 DIAGNOSIS — M19012 Primary osteoarthritis, left shoulder: Secondary | ICD-10-CM | POA: Diagnosis not present

## 2023-09-13 DIAGNOSIS — M76821 Posterior tibial tendinitis, right leg: Secondary | ICD-10-CM | POA: Diagnosis not present

## 2023-09-14 DIAGNOSIS — M25571 Pain in right ankle and joints of right foot: Secondary | ICD-10-CM | POA: Diagnosis not present

## 2023-09-22 DIAGNOSIS — M76821 Posterior tibial tendinitis, right leg: Secondary | ICD-10-CM | POA: Diagnosis not present

## 2023-10-01 DIAGNOSIS — Q742 Other congenital malformations of lower limb(s), including pelvic girdle: Secondary | ICD-10-CM | POA: Diagnosis not present

## 2023-10-01 DIAGNOSIS — M2141 Flat foot [pes planus] (acquired), right foot: Secondary | ICD-10-CM | POA: Diagnosis not present

## 2023-10-01 DIAGNOSIS — M76821 Posterior tibial tendinitis, right leg: Secondary | ICD-10-CM | POA: Diagnosis not present

## 2023-10-01 DIAGNOSIS — M79671 Pain in right foot: Secondary | ICD-10-CM | POA: Diagnosis not present

## 2023-11-19 DIAGNOSIS — M2141 Flat foot [pes planus] (acquired), right foot: Secondary | ICD-10-CM | POA: Diagnosis not present

## 2023-11-19 DIAGNOSIS — Q742 Other congenital malformations of lower limb(s), including pelvic girdle: Secondary | ICD-10-CM | POA: Diagnosis not present

## 2023-11-19 DIAGNOSIS — M76821 Posterior tibial tendinitis, right leg: Secondary | ICD-10-CM | POA: Diagnosis not present

## 2023-12-13 NOTE — Patient Instructions (Incomplete)
 1. Allergic reaction - We are getting that blood work to look for the nut allergy .  - I am confused by the tolerance of the hamburgers and your very high numbers on the lab results.  - I guess it is OK to continue with eating the hamburgers since you have done fine.  - EpiPen  refill sent    2. Intermittent asthma, uncomplicated - Continue with albuterol  as needed for now.    3.Schedule a follow up appointment in months or sooner if needed  Websites that have reliable patient information: 1. American Academy of Asthma, Allergy , and Immunology: www.aaaai.org 2. Food Allergy  Research and Education (FARE): foodallergy.org 3. Mothers of Asthmatics: http://www.asthmacommunitynetwork.org 4. [employer] of Allergy , Asthma, and Immunology: www.acaai.org

## 2023-12-14 ENCOUNTER — Other Ambulatory Visit: Payer: Self-pay | Admitting: Diagnostic Neuroimaging

## 2023-12-14 ENCOUNTER — Ambulatory Visit (INDEPENDENT_AMBULATORY_CARE_PROVIDER_SITE_OTHER): Admitting: Family

## 2023-12-14 ENCOUNTER — Encounter: Payer: Self-pay | Admitting: Family

## 2023-12-14 VITALS — BP 120/76 | HR 58 | Temp 97.6°F | Wt 150.8 lb

## 2023-12-14 DIAGNOSIS — T7840XD Allergy, unspecified, subsequent encounter: Secondary | ICD-10-CM

## 2023-12-14 DIAGNOSIS — J4520 Mild intermittent asthma, uncomplicated: Secondary | ICD-10-CM

## 2023-12-14 DIAGNOSIS — Z91018 Allergy to other foods: Secondary | ICD-10-CM

## 2023-12-14 MED ORDER — EPINEPHRINE 0.3 MG/0.3ML IJ SOAJ: 0.3000 mg | INTRAMUSCULAR | 1 refills | Status: AC | PRN

## 2023-12-14 MED ORDER — ALBUTEROL SULFATE HFA 108 (90 BASE) MCG/ACT IN AERS: INHALATION_SPRAY | RESPIRATORY_TRACT | 1 refills | Status: AC

## 2023-12-14 NOTE — Progress Notes (Signed)
 [address] [place] [address] Dept: [phone]  FOLLOW UP NOTE  Patient ID: Jeanette [name], female    DOB: 26-Jul-1945  Age: 78 y.o. MRN: [id number] Date of Office Visit: 12/14/2023  Assessment  Chief Complaint: Urticaria, Pruritus, and Other (States she consumed a BLT few hour later she stared itching broke out in hives and later that night had explosive diarrhea all night/States that this has 3 times previously with no known cause./)  HPI Jeanette [name] is a 78 year old female who presents today for follow-up of food allergy , allergic reaction/alpha gal syndrome, complex medical history including Parkinson's, hyperlipidemia, spinal stenosis, and squamous cell.  She denies any new diagnosis since her last office visit.  She thinks around January 28 she had a left shoulder replacement.  She reports that she has periodically been having allergy  reactions.  Last week while she was in [place] around 6 or 6:30 PM she ate a bacon lettuce tomato sandwich and went home.  Around 10 PM the palms of her hands started itching, then her ears, and back.  She noticed whelps.  She took 2 Benadryl  and the itching eased off.  Around 3:30 in the morning she had really bad diarrhea until 5:00 in the morning and also tightness in her chest.  She did not use her epinephrine  autoinjector device.  She reports that she thought she only used this if her throat was closing.  She did call 911.  Then again around November, before Christmas, she thinks that she was eating a chocolate covered almond and ended up in the hospital.  She could not breathe.  She reports that this is where she got the EpiPen  she has now.  She had 2 walnuts with no problems last night.  She does eat peanut/peanut products without any problems.  She has had 2 other reactions, but cannot remember them because they have been over a year ago.  Discussed that her previous lab work for alpha gal was elevated in 2024 and I recommended that she avoid  all red meat.  She reports that she is going out with a friend for lunch tomorrow and is planning on eating ribs.  Discussed that ribs is a part of red mammalian meat that she needs to avoid.  Since her last office visit she reports that she has had to take 2 ticks off of her this summer around June.  She reports that there has been times where she has been able to eat red meat without any problems.  Asthma is reported as controlled with albuterol  as needed.  She reports that she has not used albuterol  in a long time.  She was diagnosed with asthma in the 1970s when she was pregnant.  She denies cough, wheeze, tightness in chest, shortness of breath, and nocturnal awakenings due to breathing problems.  Since her last office visit she has not required any systemic steroids or made any trips to the emergency room or urgent care due to breathing problems.   Drug Allergies:  Allergies  Allergen Reactions   Compazine [Prochlorperazine Edisylate] Anaphylaxis and Swelling    Mouth and tongue swell, throat swells closed   Prochlorperazine Other (See Comments)    Review of Systems: Negative except as per HPI  Physical Exam: BP 120/76   Pulse (!) 58   Temp 97.6 F (36.4 C)   Wt 150 lb 12.8 oz (68.4 kg)   SpO2 97%   BMI 22.93 kg/m    Physical Exam Constitutional:  Appearance: Normal appearance.  HENT:     Head: Normocephalic and atraumatic.     Right Ear: Tympanic membrane, ear canal and external ear normal.     Left Ear: Tympanic membrane, ear canal and external ear normal.     Nose: Nose normal.     Mouth/Throat:     Mouth: Mucous membranes are moist.     Pharynx: Oropharynx is clear.  Eyes:     Conjunctiva/sclera: Conjunctivae normal.  Cardiovascular:     Rate and Rhythm: Regular rhythm.     Heart sounds: Normal heart sounds.  Pulmonary:     Effort: Pulmonary effort is normal.     Breath sounds: Normal breath sounds.     Comments: Lungs clear to auscultation Musculoskeletal:      Cervical back: Neck supple.  Skin:    General: Skin is warm.  Neurological:     Mental Status: She is alert and oriented to person, place, and time.  Psychiatric:        Mood and Affect: Mood normal.        Behavior: Behavior normal.        Thought Content: Thought content normal.        Judgment: Judgment normal.     Diagnostics: FVC 1.27 L (57%), FEV1 0.96 L (56%), FEV1/FVC 0.76.  Spirometry indicates possible restrictive defect.  4 puffs of Xopenex given.  Postbronchodilator response shows FVC 1.38 L (62%), FEV1 1.01 L (59%), FEV1/FVC 0.73.  There is only a 5% change in FEV1.  Spirometry indicates possible restrictive defect  Assessment and Plan: 1. Allergic reaction, subsequent encounter   2. Food allergy    3. Mild intermittent asthma, uncomplicated     Meds ordered this encounter  Medications   EPINEPHrine  0.3 mg/0.3 mL IJ SOAJ injection    Sig: Inject 0.3 mg into the muscle as needed for anaphylaxis.    Dispense:  2 each    Refill:  1   albuterol  (VENTOLIN  HFA) 108 (90 Base) MCG/ACT inhaler    Sig: Inhale 2 puffs every 4-6 hours as needed for cough, wheeze, tightness in chest, or shortness of breath.    Dispense:  18 g    Refill:  1    Patient Instructions  1. Allergic reaction - Start avoiding ALL RED meat ( beef, pork, lamb) and tree nuts. Ok to continue to eat peanuts as she has no problems with this - Emergency action plan given and reviewed when to use epinephrine  versus antihistamine (Zyrtec) - Demonstrated how to use epinephrine  autoinjector device - We will get lab work for alpha gal nuts.  We will call you with results once they are back    - If your symptoms re-occur, begin a journal of events that occurred for up to 6 hours before your symptoms began including foods and beverages consumed, soaps or perfumes you had contact with, and medications.   2. Intermittent asthma, uncomplicated - Continue with albuterol  as needed for now.    3.Schedule a  follow up appointment in 2-3 months or sooner if needed  Websites that have reliable patient information: 1. American Academy of Asthma, Allergy , and Immunology: www.aaaai.org 2. Food Allergy  Research and Education (FARE): foodallergy.org 3. Mothers of Asthmatics: http://www.asthmacommunitynetwork.org 4. American College of Allergy , Asthma, and Immunology: www.acaai.org          Return in about 2 months (around 02/13/2024), or if symptoms worsen or fail to improve.    Thank you for the opportunity to care for this patient.  Please do not hesitate to contact me with questions.  Wanda Craze, FNP [facility]  

## 2023-12-20 LAB — ALPHA-GAL PANEL
Allergen Lamb IgE: 1.68 kU/L — AB
Beef IgE: 4.23 kU/L — AB
IgE (Immunoglobulin E), Serum: 55 [IU]/mL (ref 6–495)
O215-IgE Alpha-Gal: 9.96 kU/L — AB
Pork IgE: 1.33 kU/L — AB

## 2023-12-20 LAB — IGE NUT PROF. W/COMPONENT RFLX
F017-IgE Hazelnut (Filbert): <0.1 kU/L
F018-IgE Brazil Nut: <0.1 kU/L
F020-IgE Almond: <0.1 kU/L
F202-IgE Cashew Nut: <0.1 kU/L
F203-IgE Pistachio Nut: <0.1 kU/L
F256-IgE Walnut: <0.1 kU/L
Macadamia Nut, IgE: <0.1 kU/L
Peanut, IgE: <0.1 kU/L
Pecan Nut IgE: <0.1 kU/L

## 2023-12-20 NOTE — Telephone Encounter (Signed)
 [id number] has called to check on the status of this refill request for pt.

## 2023-12-22 ENCOUNTER — Ambulatory Visit: Payer: Self-pay | Admitting: Family

## 2023-12-22 NOTE — Progress Notes (Signed)
 Please let Frazier know that her lab work looking for alpha gal allergy  is still positive. Have her symptoms gotten better since avoiding all red meat?  Her lab work to tree nuts were negative. I would like for her to continue to avoid tree nuts,but schedule an appointment for skin testing to tree nuts.She will need to be off all antihistamines 3 days prior to this appointment.  Ok to continue to eat peanuts since she eats these without any problems.

## 2023-12-23 DIAGNOSIS — Q742 Other congenital malformations of lower limb(s), including pelvic girdle: Secondary | ICD-10-CM | POA: Diagnosis not present

## 2023-12-23 DIAGNOSIS — M76821 Posterior tibial tendinitis, right leg: Secondary | ICD-10-CM | POA: Diagnosis not present

## 2023-12-29 NOTE — Progress Notes (Signed)
 Thank you for the update. She needs to continue to avoid all read meat and tree nuts

## 2023-12-30 DIAGNOSIS — G250 Essential tremor: Secondary | ICD-10-CM | POA: Diagnosis not present

## 2023-12-30 DIAGNOSIS — Z01818 Encounter for other preprocedural examination: Secondary | ICD-10-CM | POA: Diagnosis not present

## 2023-12-30 DIAGNOSIS — E039 Hypothyroidism, unspecified: Secondary | ICD-10-CM | POA: Diagnosis not present

## 2023-12-30 DIAGNOSIS — I10 Essential (primary) hypertension: Secondary | ICD-10-CM | POA: Diagnosis not present

## 2023-12-30 DIAGNOSIS — J4520 Mild intermittent asthma, uncomplicated: Secondary | ICD-10-CM | POA: Diagnosis not present

## 2023-12-30 DIAGNOSIS — E782 Mixed hyperlipidemia: Secondary | ICD-10-CM | POA: Diagnosis not present

## 2023-12-30 DIAGNOSIS — N182 Chronic kidney disease, stage 2 (mild): Secondary | ICD-10-CM | POA: Diagnosis not present

## 2024-01-17 ENCOUNTER — Ambulatory Visit: Payer: Medicare Other | Admitting: Diagnostic Neuroimaging

## 2024-02-09 ENCOUNTER — Encounter (HOSPITAL_BASED_OUTPATIENT_CLINIC_OR_DEPARTMENT_OTHER): Payer: Self-pay | Admitting: Orthopaedic Surgery

## 2024-02-09 ENCOUNTER — Other Ambulatory Visit: Payer: Self-pay

## 2024-02-10 NOTE — H&P (Addendum)
 ORTHOPAEDIC SURGERY H&P  Subjective:  The patient presents for Surgery in the form of right posterior tibialis tendon debridement with possible repair and with excision of accessory navicular, possible tendon transfer, possible GR/TAL.   Past Medical History:  Diagnosis Date   Angio-edema    Arthritis    Asthma    hx   Hypertension    Hypothyroidism    Neuromuscular disorder (HCC)    tremors in hands-mod-severe   Parkinsonism (HCC)    Pneumonia    hx   PONV (postoperative nausea and vomiting)    has scop patch to apply  10/13/12   Stroke [facility]) 2011   tia's   Thyroid  disease    TIA (transient ischemic attack)    Urticaria     Past Surgical History:  Procedure Laterality Date   ANTERIOR CERVICAL DECOMP/DISCECTOMY FUSION N/A 10/14/2012   Procedure: ANTERIOR CERVICAL DECOMPRESSION/DISCECTOMY FUSION 1 LEVEL/HARDWARE REMOVAL;  Surgeon: Victory Gens, MD;  Location: [facility];  Service: Neurosurgery;  Laterality: N/A;  C7-T1 Anterior cervical decompression/diskectomy/fusion/removal of old synthes plate   BACK SURGERY  01   BACK SURGERY  2019   DILATION AND CURETTAGE OF UTERUS     HYSTEROSCOPY  2004, 2007   endometrial polyp   KNEE SURGERY Right    78 yrs old   NASAL SINUS SURGERY Right 08/2016   polyp removed   NECK SURGERY  07,03   cerv,Fusion   ROTATOR CUFF REPAIR Right 2015   TONSILLECTOMY       (Not in an outpatient encounter)    Allergies  Allergen Reactions   Alpha-Gal Anaphylaxis    Throat swells   Compazine [Prochlorperazine Edisylate] Anaphylaxis and Swelling    Mouth and tongue swell, throat swells closed   Prochlorperazine Other (See Comments)    Social History   Socioeconomic History   Marital status: Married    Spouse name: Signe   Number of children: 2   Years of education: 14   Highest education level: Not on file  Occupational History    Comment: retired Tree surgeon  Tobacco Use   Smoking status: Never   Smokeless tobacco: Never  Vaping Use    Vaping status: Never Used  Substance and Sexual Activity   Alcohol use: Yes    Alcohol/week: 1.0 standard drink of alcohol    Types: 1 Glasses of wine per week    Comment: wine 1-2 week   Drug use: No   Sexual activity: Yes    Birth control/protection: Post-menopausal    Comment: 1st intercourse 66 yo-1 partner  Other Topics Concern   Not on file  Social History Narrative   Lives with husband   Caffeine - coffee/tea, 1-2 cups daily   Social Drivers of Corporate investment banker Strain: Not on file  Food Insecurity: Not on file  Transportation Needs: Not on file  Physical Activity: Not on file  Stress: Not on file  Social Connections: Not on file  Intimate Partner Violence: Not on file     History reviewed. No pertinent family history.   Review of Systems Pertinent items are noted in HPI.  Objective: Vital signs in last 24 hours:    02/09/2024   12:43 PM 12/14/2023    9:44 AM 02/08/2023    1:39 PM  Vitals with BMI  Height 5' 0    Weight 151 lbs 150 lbs 13 oz 148 lbs 13 oz  BMI 29.49    Systolic  120 116  Diastolic  76 60  Pulse  58 70      EXAM: General: Well nourished, well developed. Awake, alert and oriented to time, place, person. Normal mood and affect. No apparent distress. Breathing room air.  Operative Lower Extremity: Alignment - Neutral Deformity - None Skin intact Tenderness to palpation - PT tendon insertion 5/5 TA, PT, GS, Per, EHL, FHL Sensation intact to light touch throughout Palpable DP and PT pulses Special testing: + Silfverskiold  The contralateral foot/ankle was examined for comparison and noted to be neurovascularly intact with no localized deformity, swelling, or tenderness.  Imaging Review All images taken were independently reviewed by me.  Assessment/Plan: The clinical and radiographic findings were reviewed and discussed at length with the patient.  The patient presents for Surgery in the form of right posterior  tibialis tendon debridement with possible repair and with excision of accessory navicular, possible tendon transfer, possible GR/TAL.  We spoke at length about the natural course of these findings. We discussed nonoperative and operative treatment options in detail.  The risks and benefits were presented and reviewed. The risks due to suture/hardware failure/irritation (or if removing hardware inability to remove part/all of hardware, recurrent instability), new/persistent/recurrent infection, stiffness, nerve/vessel/tendon injury, nonunion/malunion of any fracture, wound healing issues, allograft usage, development of arthritis, failure of this surgery, possibility of external fixation in certain situations, possibility of delayed definitive surgery, need for further surgery, prolonged wound care including further soft tissue coverage procedures, thromboembolic events, anesthesia/medical complications/events perioperatively and beyond, amputation, death among others were discussed. The patient acknowledged the explanation and agreed to proceed with the plan.  Lillia Mountain  Orthopaedic Surgery [facility]

## 2024-02-10 NOTE — Discharge Instructions (Addendum)
 Lillia Mountain, MD [facility]  Please read the following information regarding your care after surgery.  Medications  You only need a prescription for the narcotic pain medicine (ex. oxycodone , Percocet, Norco).  All of the other medicines listed below are available over the counter. ? acetaminophen  (Tylenol ) 500 mg every 4-6 hours as you need for minor to moderate pain  ? To help prevent blood clots, take aspirin  (81 mg) twice daily for 28 days after surgery.  You should also get up every hour while you are awake to move around.  Weight Bearing ? OK to walk on the operative leg only AFTER the nerve block has completely worn off and you have full sensation in your leg. Please use your boot and walker at all times.  Cast / Splint / Dressing ? Keep your dressing clean and dry.  Don't put anything (coat hanger, pencil, etc) down inside of it.  If it gets wet, please notify the office immediately.  Do NOT wrap your ace wrap tight. It is ok for it to be slightly loose.  Swelling IMPORTANT: It is normal for you to have swelling where you had surgery. To reduce swelling and pain, keep at least 3 pillows under your leg so that your toes are above your nose and your heel is above the level of your hip.  It may be necessary to keep your foot or leg elevated for several weeks.  This is critical to helping your incisions heal and your pain to feel better.  Follow Up Call my office at [phone] when you are discharged from the hospital or surgery center to schedule an appointment to be seen within 7-10 days after surgery.  Call my office at [phone] if you develop a fever >101.5 F, nausea, vomiting, bleeding from the surgical site or severe pain.     Post Anesthesia Home Care Instructions  Activity: Get plenty of rest for the remainder of the day. A responsible individual must stay with you for 24 hours following the procedure.  For the next 24 hours, DO NOT: -Drive a car -Social worker -Drink alcoholic beverages -Take any medication unless instructed by your physician -Make any legal decisions or sign important papers.  Meals: Start with liquid foods such as gelatin or soup. Progress to regular foods as tolerated. Avoid greasy, spicy, heavy foods. If nausea and/or vomiting occur, drink only clear liquids until the nausea and/or vomiting subsides. Call your physician if vomiting continues.  Special Instructions/Symptoms: Your throat may feel dry or sore from the anesthesia or the breathing tube placed in your throat during surgery. If this causes discomfort, gargle with warm salt water. The discomfort should disappear within 24 hours.  May have Tylenol  after 1:10pm if needed.     Regional Anesthesia Blocks  1. You may not be able to move or feel the blocked extremity after a regional anesthetic block. This may last may last from 3-48 hours after placement, but it will go away. The length of time depends on the medication injected and your individual response to the medication. As the nerves start to wake up, you may experience tingling as the movement and feeling returns to your extremity. If the numbness and inability to move your extremity has not gone away after 48 hours, please call your surgeon.   2. The extremity that is blocked will need to be protected until the numbness is gone and the strength has returned. Because you cannot feel it, you will need to take extra  care to avoid injury. Because it may be weak, you may have difficulty moving it or using it. You may not know what position it is in without looking at it while the block is in effect.  3. For blocks in the legs and feet, returning to weight bearing and walking needs to be done carefully. You will need to wait until the numbness is entirely gone and the strength has returned. You should be able to move your leg and foot normally before you try and bear weight or walk. You will need someone to be with  you when you first try to ensure you do not fall and possibly risk injury.  4. Bruising and tenderness at the needle site are common side effects and will resolve in a few days.  5. Persistent numbness or new problems with movement should be communicated to the surgeon or the [facility] [phone] [facility] [phone]).

## 2024-02-13 NOTE — Patient Instructions (Incomplete)
 1. Allergic reaction - Continue avoiding ALL RED meat ( beef, pork, lamb) and tree nuts. Ok to continue to eat peanuts as she has no problems with this - Follow emergency action plan  - skin testing to tree nuts (cashew, walnut, almond, hazelnut, pecan, pistachio, [place] nut, and coconut are negative with adequate controls.  Lab work to tree nuts was also negative in August 2025.  If interested recommend scheduling an in office oral food challenge to mixed tree nut butter.  You will need to be off all antihistamines 3 days prior to this appointment and in good health (no recent vaccines or antibiotics in the past 7 days).  This appointment will last approximately 2 to 4 hours.  We have mixed tree nut butter here so you do not need to bring anything.  If not interested in doing this challenge you need to continue to avoid tree nuts and have access to your epinephrine  autoinjector device at all times  - If your symptoms re-occur, begin a journal of events that occurred for up to 6 hours before your symptoms began including foods and beverages consumed, soaps or perfumes you had contact with, and medications.   2. Intermittent asthma, uncomplicated - Continue with albuterol  as needed for now.    3.Schedule: Oral food challenge to mixed tree nut butter  Websites that have reliable patient information: 1. American Academy of Asthma, Allergy , and Immunology: www.aaaai.org 2. Food Allergy  Research and Education (FARE): foodallergy.org 3. Mothers of Asthmatics: http://www.asthmacommunitynetwork.org 4. [employer] of Allergy , Asthma, and Immunology: www.acaai.org

## 2024-02-14 ENCOUNTER — Encounter: Payer: Self-pay | Admitting: Family

## 2024-02-14 ENCOUNTER — Ambulatory Visit: Admitting: Family

## 2024-02-14 DIAGNOSIS — Z91018 Allergy to other foods: Secondary | ICD-10-CM | POA: Diagnosis not present

## 2024-02-14 DIAGNOSIS — T7840XD Allergy, unspecified, subsequent encounter: Secondary | ICD-10-CM | POA: Diagnosis not present

## 2024-02-14 NOTE — Progress Notes (Signed)
 Date of Service/Encounter:  02/14/24  Allergy  testing appointment   Initial visit on 12/14/23, seen for allergic reaction, food allergy , and mild intermittent asthma.  Please see that note for additional details. She reports that she has done well since avoiding all red meat. She also reports that she eats peanut products without any problems.  She does mention that a couple weeks ago she did eat 1 or 2 walnuts without any problems.  Discussed  the importance of  not trying tree nuts at home and the risk of anaphylaxis and death.  She verbalizes understanding.  Today reports for allergy  diagnostic testing:    Food Adult Perc - 02/14/24 1400     Time Antigen Placed 1412    Allergen Manufacturer Jestine    Location Back    Number of allergen test 10     Control-buffer 50% Glycerol Negative    Control-Histamine 3+    10. Cashew Negative    11. Walnut Food Negative    12. Almond Negative    13. Hazelnut Negative    14. Pecan Food Negative    15. Pistachio Negative    16. [place] Nut Negative    17. Coconut Negative           DIAGNOSTICS:  Skin Testing: Select foods. Adequate positive and negative controls Results discussed with patient/family.  Food Adult Perc - 02/14/24 1400     Time Antigen Placed 1412    Allergen Manufacturer Jestine    Location Back    Number of allergen test 10     Control-buffer 50% Glycerol Negative    Control-Histamine 3+    10. Cashew Negative    11. Walnut Food Negative    12. Almond Negative    13. Hazelnut Negative    14. Pecan Food Negative    15. Pistachio Negative    16. [place] Nut Negative    17. Coconut Negative          Allergy  testing results were read and interpreted by myself, documented by clinical staff.  Patient provided with copy of allergy  testing along with avoidance measures when indicated.   1. Allergic reaction - Continue avoiding ALL RED meat ( beef, pork, lamb) and tree nuts. Ok to continue to eat peanuts as she  has no problems with this - Follow emergency action plan  - skin testing to tree nuts (cashew, walnut, almond, hazelnut, pecan, pistachio, [place] nut, and coconut are negative with adequate controls.  Lab work to tree nuts was also negative in August 2025.  If interested recommend scheduling an in office oral food challenge to mixed tree nut butter.  You will need to be off all antihistamines 3 days prior to this appointment and in good health (no recent vaccines or antibiotics in the past 7 days).  This appointment will last approximately 2 to 4 hours.  We have mixed tree nut butter here so you do not need to bring anything.  If not interested in doing this challenge you need to continue to avoid tree nuts and have access to your epinephrine  autoinjector device at all times  - If your symptoms re-occur, begin a journal of events that occurred for up to 6 hours before your symptoms began including foods and beverages consumed, soaps or perfumes you had contact with, and medications.   2. Intermittent asthma, uncomplicated - Continue with albuterol  as needed for now.    3.Schedule: Oral food challenge to mixed tree nut butter  Websites that have  reliable patient information: 1. American Academy of Asthma, Allergy , and Immunology: www.aaaai.org 2. Food Allergy  Research and Education (FARE): foodallergy.org 3. Mothers of Asthmatics: http://www.asthmacommunitynetwork.org 4. [employer] of Allergy , Asthma, and Immunology: www.acaai.org     Wanda Craze, FNP [facility] [place] 

## 2024-02-15 ENCOUNTER — Encounter (HOSPITAL_BASED_OUTPATIENT_CLINIC_OR_DEPARTMENT_OTHER)
Admission: RE | Admit: 2024-02-15 | Discharge: 2024-02-15 | Disposition: A | Discharge status: Home / Self Care | Source: Ambulatory Visit | Attending: Orthopaedic Surgery | Admitting: Orthopaedic Surgery

## 2024-02-15 DIAGNOSIS — M1990 Unspecified osteoarthritis, unspecified site: Secondary | ICD-10-CM | POA: Diagnosis not present

## 2024-02-15 DIAGNOSIS — Q742 Other congenital malformations of lower limb(s), including pelvic girdle: Secondary | ICD-10-CM | POA: Diagnosis not present

## 2024-02-15 DIAGNOSIS — M76821 Posterior tibial tendinitis, right leg: Secondary | ICD-10-CM | POA: Diagnosis not present

## 2024-02-15 DIAGNOSIS — I10 Essential (primary) hypertension: Secondary | ICD-10-CM | POA: Diagnosis not present

## 2024-02-15 DIAGNOSIS — J45909 Unspecified asthma, uncomplicated: Secondary | ICD-10-CM | POA: Diagnosis not present

## 2024-02-16 ENCOUNTER — Other Ambulatory Visit: Payer: Self-pay | Admitting: Diagnostic Neuroimaging

## 2024-02-16 ENCOUNTER — Other Ambulatory Visit: Payer: Self-pay

## 2024-02-16 ENCOUNTER — Encounter (HOSPITAL_BASED_OUTPATIENT_CLINIC_OR_DEPARTMENT_OTHER)
Admission: RE | Disposition: A | Discharge status: Home / Self Care | Payer: Self-pay | Source: Home / Self Care | Attending: Orthopaedic Surgery

## 2024-02-16 ENCOUNTER — Ambulatory Visit (HOSPITAL_BASED_OUTPATIENT_CLINIC_OR_DEPARTMENT_OTHER)
Admission: RE | Admit: 2024-02-16 | Discharge: 2024-02-16 | Disposition: A | Discharge status: Home / Self Care | Attending: Orthopaedic Surgery | Admitting: Orthopaedic Surgery

## 2024-02-16 ENCOUNTER — Ambulatory Visit (HOSPITAL_BASED_OUTPATIENT_CLINIC_OR_DEPARTMENT_OTHER): Payer: Self-pay | Admitting: Anesthesiology

## 2024-02-16 ENCOUNTER — Ambulatory Visit (HOSPITAL_BASED_OUTPATIENT_CLINIC_OR_DEPARTMENT_OTHER)

## 2024-02-16 ENCOUNTER — Encounter (HOSPITAL_BASED_OUTPATIENT_CLINIC_OR_DEPARTMENT_OTHER): Payer: Self-pay | Admitting: Orthopaedic Surgery

## 2024-02-16 DIAGNOSIS — G8918 Other acute postprocedural pain: Secondary | ICD-10-CM | POA: Diagnosis not present

## 2024-02-16 DIAGNOSIS — M898X7 Other specified disorders of bone, ankle and foot: Secondary | ICD-10-CM | POA: Diagnosis not present

## 2024-02-16 DIAGNOSIS — M76821 Posterior tibial tendinitis, right leg: Secondary | ICD-10-CM

## 2024-02-16 DIAGNOSIS — I10 Essential (primary) hypertension: Secondary | ICD-10-CM | POA: Diagnosis not present

## 2024-02-16 DIAGNOSIS — J45909 Unspecified asthma, uncomplicated: Secondary | ICD-10-CM | POA: Diagnosis not present

## 2024-02-16 DIAGNOSIS — M1990 Unspecified osteoarthritis, unspecified site: Secondary | ICD-10-CM | POA: Insufficient documentation

## 2024-02-16 DIAGNOSIS — I159 Secondary hypertension, unspecified: Secondary | ICD-10-CM

## 2024-02-16 DIAGNOSIS — M7671 Peroneal tendinitis, right leg: Secondary | ICD-10-CM | POA: Diagnosis not present

## 2024-02-16 DIAGNOSIS — Q742 Other congenital malformations of lower limb(s), including pelvic girdle: Secondary | ICD-10-CM | POA: Diagnosis not present

## 2024-02-16 HISTORY — PX: EXCISION OF ACCESSORY NAVICULAR WITH ADVANCEMENT OF POSTERIOR TIBIAL TENDON: SHX6272

## 2024-02-16 SURGERY — ADVANCEMENT, TENDON, POSTERIOR TIBIAL, WITH ACCESSORY NAVICULAR BONE EXCISION
Anesthesia: General | Site: Ankle | Laterality: Right

## 2024-02-16 MED ORDER — PROPOFOL 10 MG/ML IV BOLUS
INTRAVENOUS | Status: DC | PRN
  Administered 2024-02-16: 30 mg via INTRAVENOUS
  Administered 2024-02-16: 100 mg via INTRAVENOUS

## 2024-02-16 MED ORDER — VANCOMYCIN HCL 500 MG IV SOLR
INTRAVENOUS | Status: DC | PRN
  Administered 2024-02-16: 500 mg via TOPICAL

## 2024-02-16 MED ORDER — POVIDONE-IODINE 10 % EX SOLN
CUTANEOUS | Status: DC | PRN
  Administered 2024-02-16: 1 via TOPICAL

## 2024-02-16 MED ORDER — BUPIVACAINE-EPINEPHRINE (PF) 0.5% -1:200000 IJ SOLN
INTRAMUSCULAR | Status: DC | PRN
  Administered 2024-02-16: 30 mL via PERINEURAL

## 2024-02-16 MED ORDER — KETOROLAC TROMETHAMINE 30 MG/ML IJ SOLN
INTRAMUSCULAR | Status: AC
  Filled 2024-02-16: qty 1

## 2024-02-16 MED ORDER — PROPOFOL 500 MG/50ML IV EMUL
INTRAVENOUS | Status: DC | PRN
Start: 2024-02-16 — End: 2024-02-16
  Administered 2024-02-16: 150 ug/kg/min via INTRAVENOUS

## 2024-02-16 MED ORDER — BUPIVACAINE-EPINEPHRINE 0.5% -1:200000 IJ SOLN
INTRAMUSCULAR | Status: DC | PRN
  Administered 2024-02-16: 10 mL

## 2024-02-16 MED ORDER — VANCOMYCIN HCL 500 MG IV SOLR
INTRAVENOUS | Status: AC
  Filled 2024-02-16: qty 40

## 2024-02-16 MED ORDER — CEFAZOLIN SODIUM-DEXTROSE 2-4 GM/100ML-% IV SOLN
2.0000 g | INTRAVENOUS | Status: AC
Start: 2024-02-16 — End: 2024-02-16
  Administered 2024-02-16: 2 g via INTRAVENOUS

## 2024-02-16 MED ORDER — FENTANYL CITRATE (PF) 100 MCG/2ML IJ SOLN
50.0000 ug | Freq: Once | INTRAMUSCULAR | Status: AC
  Administered 2024-02-16: 50 ug via INTRAVENOUS

## 2024-02-16 MED ORDER — DEXAMETHASONE SODIUM PHOSPHATE 4 MG/ML IJ SOLN
INTRAMUSCULAR | Status: DC | PRN
  Administered 2024-02-16: 4 mg via INTRAVENOUS

## 2024-02-16 MED ORDER — ONDANSETRON HCL 4 MG/2ML IJ SOLN
INTRAMUSCULAR | Status: AC
  Filled 2024-02-16: qty 2

## 2024-02-16 MED ORDER — FENTANYL CITRATE (PF) 100 MCG/2ML IJ SOLN
INTRAMUSCULAR | Status: AC
  Filled 2024-02-16: qty 2

## 2024-02-16 MED ORDER — FENTANYL CITRATE (PF) 100 MCG/2ML IJ SOLN
INTRAMUSCULAR | Status: DC | PRN
  Administered 2024-02-16 (×2): 25 ug via INTRAVENOUS

## 2024-02-16 MED ORDER — ACETAMINOPHEN 500 MG PO TABS
1000.0000 mg | ORAL_TABLET | Freq: Once | ORAL | Status: AC
  Administered 2024-02-16: 1000 mg via ORAL

## 2024-02-16 MED ORDER — EPHEDRINE SULFATE (PRESSORS) 50 MG/ML IJ SOLN
INTRAMUSCULAR | Status: DC | PRN
  Administered 2024-02-16 (×2): 5 mg via INTRAVENOUS

## 2024-02-16 MED ORDER — LIDOCAINE 2% (20 MG/ML) 5 ML SYRINGE
INTRAMUSCULAR | Status: AC
  Filled 2024-02-16: qty 5

## 2024-02-16 MED ORDER — CEFAZOLIN SODIUM-DEXTROSE 2-4 GM/100ML-% IV SOLN
INTRAVENOUS | Status: AC
  Filled 2024-02-16: qty 100

## 2024-02-16 MED ORDER — LACTATED RINGERS IV SOLN: INTRAVENOUS | Status: DC

## 2024-02-16 MED ORDER — EPHEDRINE 5 MG/ML INJ
INTRAVENOUS | Status: AC
  Filled 2024-02-16: qty 10

## 2024-02-16 MED ORDER — ACETAMINOPHEN 500 MG PO TABS
ORAL_TABLET | ORAL | Status: AC
  Filled 2024-02-16: qty 2

## 2024-02-16 MED ORDER — CHLORHEXIDINE GLUCONATE 4 % EX SOLN: 60.0000 mL | Freq: Once | CUTANEOUS | Status: DC

## 2024-02-16 MED ORDER — FENTANYL CITRATE (PF) 100 MCG/2ML IJ SOLN: 25.0000 ug | INTRAMUSCULAR | Status: DC | PRN

## 2024-02-16 MED ORDER — LIDOCAINE HCL (CARDIAC) PF 100 MG/5ML IV SOSY
PREFILLED_SYRINGE | INTRAVENOUS | Status: DC | PRN
  Administered 2024-02-16: 40 mg via INTRAVENOUS

## 2024-02-16 MED ORDER — 0.9 % SODIUM CHLORIDE (POUR BTL) OPTIME
TOPICAL | Status: DC | PRN
  Administered 2024-02-16: 1000 mL

## 2024-02-16 MED ORDER — ONDANSETRON HCL 4 MG/2ML IJ SOLN
INTRAMUSCULAR | Status: DC | PRN
  Administered 2024-02-16: 4 mg via INTRAVENOUS

## 2024-02-16 MED ORDER — DEXAMETHASONE SODIUM PHOSPHATE 10 MG/ML IJ SOLN
INTRAMUSCULAR | Status: AC
Start: 2024-02-16 — End: 2024-02-16
  Filled 2024-02-16: qty 1

## 2024-02-16 MED ORDER — MIDAZOLAM HCL 2 MG/2ML IJ SOLN
INTRAMUSCULAR | Status: AC
  Filled 2024-02-16: qty 2

## 2024-02-16 SURGICAL SUPPLY — 51 items
ANCH SUT JUGGERKNOT 2X1.45 BL (Anchor) ×1 IMPLANT
BLADE AVERAGE 25X9 (BLADE) IMPLANT
BLADE MICRO SAGITTAL (BLADE) IMPLANT
BLADE SURG 15 STRL LF DISP TIS (BLADE) ×4 IMPLANT
BNDG COMPR ESMARK 6X3 LF (GAUZE/BANDAGES/DRESSINGS) IMPLANT
BNDG ELASTIC 6X10 VLCR STRL LF (GAUZE/BANDAGES/DRESSINGS) ×2 IMPLANT
BNDG GAUZE DERMACEA FLUFF 4 (GAUZE/BANDAGES/DRESSINGS) ×2 IMPLANT
CANISTER SUCT 1200ML W/VALVE (MISCELLANEOUS) ×2 IMPLANT
CHLORAPREP W/TINT 26 (MISCELLANEOUS) ×2 IMPLANT
COVER BACK TABLE 60X90IN (DRAPES) ×2 IMPLANT
CUFF TRNQT CYL 34X4.125X (TOURNIQUET CUFF) ×2 IMPLANT
DRAPE EXTREMITY T 121X128X90 (DISPOSABLE) ×2 IMPLANT
DRAPE IMP U-DRAPE 54X76 (DRAPES) ×2 IMPLANT
DRAPE OEC MINIVIEW 54X84 (DRAPES) IMPLANT
DRAPE U-SHAPE 47X51 STRL (DRAPES) ×2 IMPLANT
DRSG MEPITEL 4X7.2 (GAUZE/BANDAGES/DRESSINGS) ×2 IMPLANT
ELECTRODE REM PT RTRN 9FT ADLT (ELECTROSURGICAL) ×2 IMPLANT
GAUZE PAD ABD 8X10 STRL (GAUZE/BANDAGES/DRESSINGS) ×10 IMPLANT
GAUZE SPONGE 4X4 12PLY STRL (GAUZE/BANDAGES/DRESSINGS) ×2 IMPLANT
GLOVE BIOGEL PI IND STRL 8 (GLOVE) ×2 IMPLANT
GLOVE SURG SS PI 7.5 STRL IVOR (GLOVE) ×4 IMPLANT
GOWN STRL REUS W/ TWL LRG LVL3 (GOWN DISPOSABLE) ×4 IMPLANT
JUGGERKNOT 1.4 SHORT SNG LOAD (BIT) ×1 IMPLANT
NDL HYPO 25X1 1.5 SAFETY (NEEDLE) IMPLANT
NDL SUT 6 .5 CRC .975X.05 MAYO (NEEDLE) IMPLANT
NEEDLE HYPO 25X1 1.5 SAFETY (NEEDLE) IMPLANT
PACK BASIN DAY SURGERY FS (CUSTOM PROCEDURE TRAY) ×2 IMPLANT
PADDING CAST ABS COTTON 4X4 ST (CAST SUPPLIES) IMPLANT
PADDING CAST SYNTHETIC 4X4 STR (CAST SUPPLIES) ×6 IMPLANT
PADDING CAST SYNTHETIC 6X4 NS (CAST SUPPLIES) ×6 IMPLANT
PENCIL SMOKE EVACUATOR (MISCELLANEOUS) ×2 IMPLANT
SANITIZER HAND ALTRA PUMP 550 (MISCELLANEOUS) ×2 IMPLANT
SHEET MEDIUM DRAPE 40X70 STRL (DRAPES) ×2 IMPLANT
SLEEVE SCD COMPRESS KNEE MED (STOCKING) ×2 IMPLANT
SPLINT FIBERGLASS 4X30 (CAST SUPPLIES) ×2 IMPLANT
SPONGE T-LAP 18X18 ~~LOC~~+RFID (SPONGE) ×2 IMPLANT
SUCTION TUBE FRAZIER 10FR DISP (SUCTIONS) IMPLANT
SUT ETHILON 2 0 FS 18 (SUTURE) IMPLANT
SUT MNCRL AB 3-0 PS2 18 (SUTURE) IMPLANT
SUT VIC AB 0 CT1 27XBRD ANBCTR (SUTURE) ×2 IMPLANT
SUT VIC AB 1 CT1 27XBRD ANBCTR (SUTURE) IMPLANT
SUT VIC AB 2-0 CT1 TAPERPNT 27 (SUTURE) IMPLANT
SUT VIC AB 3-0 SH 27X BRD (SUTURE) ×2 IMPLANT
SUTURE FIBERWR #2 38 T-5 BLUE (SUTURE) IMPLANT
SUTURE TAPE 1.3 FIBERLOP 20 ST (SUTURE) IMPLANT
SYR BULB EAR ULCER 3OZ GRN STR (SYRINGE) ×2 IMPLANT
SYR CONTROL 10ML LL (SYRINGE) IMPLANT
TOWEL GREEN STERILE FF (TOWEL DISPOSABLE) ×4 IMPLANT
TUBE CONNECTING 20X1/4 (TUBING) IMPLANT
UNDERPAD 30X36 HEAVY ABSORB (UNDERPADS AND DIAPERS) ×2 IMPLANT
YANKAUER SUCT BULB TIP NO VENT (SUCTIONS) IMPLANT

## 2024-02-16 NOTE — Anesthesia Procedure Notes (Signed)
 Anesthesia Regional Block: Popliteal block   Pre-Anesthetic Checklist: , timeout performed,  Correct Patient, Correct Site, Correct Laterality,  Correct Procedure, Correct Position, site marked,  Risks and benefits discussed,  Pre-op evaluation,  At surgeon's request and post-op pain management  Laterality: Right  Prep: Maximum Sterile Barrier Precautions used, chloraprep       Needles:  Injection technique: Single-shot  Needle Type: Echogenic Stimulator Needle     Needle Length: 9cm  Needle Gauge: 21     Additional Needles:   Procedures:,,,, ultrasound used (permanent image in chart),,    Narrative:  Start time: 02/16/2024 7:34 AM End time: 02/16/2024 7:44 AM Injection made incrementally with aspirations every 5 mL.  Performed by: Personally  Anesthesiologist: Epifanio Fallow, MD

## 2024-02-16 NOTE — Transfer of Care (Signed)
 Immediate Anesthesia Transfer of Care Note  Patient: Jeanette [name]  Procedure(s) Performed: Right posterior tibialis tendon debridement and repair with accessory navicular bone excision (Right: Ankle)  Patient Location: PACU  Anesthesia Type:General  Level of Consciousness: awake and patient cooperative  Airway & Oxygen Therapy: Patient Spontanous Breathing and Patient connected to nasal cannula oxygen  Post-op Assessment: Report given to RN and Post -op Vital signs reviewed and stable  Post vital signs: Reviewed and stable  Last Vitals:  Vitals Value Taken Time  BP    Temp    Pulse 59 02/16/24 10:03  Resp 16 02/16/24 10:03  SpO2 96 % 02/16/24 10:03  Vitals shown include unfiled device data.  Last Pain:  Vitals:   02/16/24 0704  TempSrc: Temporal  PainSc: 2       Patients Stated Pain Goal: 3 (02/16/24 0704)  Complications: No notable events documented.

## 2024-02-16 NOTE — Progress Notes (Signed)
Assisted Dr. Edmond Fitzgerald with right, popliteal, ultrasound guided block. Side rails up, monitors on throughout procedure. See vital signs in flow sheet. Tolerated Procedure well. 

## 2024-02-16 NOTE — Op Note (Signed)
 02/16/2024  1:02 PM   PATIENT: Jeanette [name]  78 y.o. 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   INDICATION FOR PROCEDURE: The patient presented with above diagnosis.  We discussed the diagnosis, alternative treatment options, risks and benefits of the above surgical intervention, as well as alternative non-operative treatments. All questions/concerns were addressed and the patient/family demonstrated appropriate understanding of the diagnosis, the procedure, the postoperative course, and overall prognosis. The patient wished to proceed with surgical intervention and signed an informed surgical consent as such, in each others presence prior to surgery.   PROCEDURE IN DETAIL: After preoperative consent was obtained and the correct operative site was identified, the patient was brought to the operating room supine on stretcher and transferred onto operating table. General anesthesia was induced. Preoperative antibiotics were administered. Surgical timeout was taken. The patient was then positioned supine with an ipsilateral hip bump. The operative lower extremity was prepped and draped in standard sterile fashion with a tourniquet around the thigh. The extremity was exsanguinated and the tourniquet was inflated to 275 mmHg.  We began by making a 4 cm curvilinear incision along the length of the posterior tibialis tendon  insertion extending to the navicular. Dissection was carried down carefully to the level of the tendon and the sheath was incised. Copious tenosynovial fluid was evacuated and the tendon debrided. A longitudinal split tear was noted at the insertion site as well as the accessory navicular in this area.   The accessory navicular bone was excised completely as verified by direct visualization and intraoperative fluoroscopy. The residual posterior tibialis tendon was then advanced and inserted into the navicular using 1.46 mm suture anchor ([employer] Jug[employer]). The longitudinal tear in the tendon was repaired.  Silfverskiold test was performed and noted to be negative. Therefore the gastrocnemius recession was deferred.   The surgical sites were thoroughly irrigated. The tourniquet was deflated and hemostasis achieved. Betadine and vancomycin powder were applied. The deep layers were closed using 2-0 vicryl. The skin was closed without tension.    The leg was cleaned with saline and sterile dressings with gauze were applied. A well padded bulky wrap was applied. The patient was awakened from anesthesia and transported to the recovery room in stable condition.    FOLLOW UP PLAN: -transfer to PACU, then home -strict NWB operative extremity until block wears off and then TDWB in CAM boot, maximum elevation -maintain dry dressings until follow up -DVT ppx: Aspirin  81 mg twice daily -follow up as outpatient within 7-10 days for wound check -sutures out in 2-3 weeks in outpatient office   RADIOGRAPHS: AP, lateral, oblique radiographs of the right foot were obtained intraoperatively. These showed interval accessory navicular excision. All hardware is appropriately positioned and of the appropriate lengths. No other acute injuries are noted.   Lillia Mountain [facility]

## 2024-02-16 NOTE — Anesthesia Procedure Notes (Signed)
 Procedure Name: LMA Insertion Date/Time: 02/16/2024 8:49 AM  Performed by: Donnell Berwyn SQUIBB, CRNAPre-anesthesia Checklist: Patient identified, Emergency Drugs available, Suction available, Patient being monitored and Timeout performed Patient Re-evaluated:Patient Re-evaluated prior to induction Oxygen Delivery Method: Circle system utilized Preoxygenation: Pre-oxygenation with 100% oxygen Induction Type: IV induction LMA: LMA inserted LMA Size: 4.0 Number of attempts: 2 Placement Confirmation: positive ETCO2 and breath sounds checked- equal and bilateral Tube secured with: Tape Dental Injury: Teeth and Oropharynx as per pre-operative assessment

## 2024-02-16 NOTE — Anesthesia Postprocedure Evaluation (Signed)
 Anesthesia Post Note  Patient: Cachet T Vessell  Procedure(s) Performed: Right posterior tibialis tendon debridement and repair with accessory navicular bone excision (Right: Ankle)     Patient location during evaluation: PACU Anesthesia Type: General and Regional Level of consciousness: awake and alert Pain management: pain level controlled Vital Signs Assessment: post-procedure vital signs reviewed and stable Respiratory status: spontaneous breathing, nonlabored ventilation and respiratory function stable Cardiovascular status: blood pressure returned to baseline and stable Postop Assessment: no apparent nausea or vomiting Anesthetic complications: no   No notable events documented.  Last Vitals:  Vitals:   02/16/24 1100 02/16/24 1200  BP: 108/62 132/77  Pulse: (!) 59 62  Resp: (!) 8 18  Temp:  (!) 36.4 C  SpO2: 95% 95%    Last Pain:  Vitals:   02/16/24 1200  TempSrc: Temporal  PainSc:                  Maelys Kinnick,W. EDMOND

## 2024-02-16 NOTE — Progress Notes (Signed)
 Orthopedic Tech Progress Note Patient Details:  Jeanette [name] 1945/11/24 [id number]  Ortho Devices Type of Ortho Device: CAM walker Ortho Device/Splint Location: RLE Ortho Device/Splint Interventions: Ordered, Other (comment)DAY SURGERY RN called requesting a medium SHORT CAM WALKER for patient, delivered to bedside    Post Interventions Patient Tolerated: Other (comment) Instructions Provided: Other (comment)  Delanna LITTIE Pac 02/16/2024, 10:36 AM

## 2024-02-16 NOTE — Anesthesia Preprocedure Evaluation (Addendum)
 Anesthesia Evaluation  Patient identified by MRN, date of birth, ID band Patient awake    Reviewed: Allergy  & Precautions, H&P , NPO status , Patient's Chart, lab work & pertinent test results  History of Anesthesia Complications (+) PONV and history of anesthetic complications  Airway Mallampati: III  TM Distance: >3 FB Neck ROM: Full    Dental no notable dental hx. (+) Teeth Intact, Dental Advisory Given   Pulmonary asthma    Pulmonary exam normal breath sounds clear to auscultation       Cardiovascular hypertension, Pt. on medications and Pt. on home beta blockers  Rhythm:Regular Rate:Normal     Neuro/Psych TIA Neuromuscular disease  negative psych ROS   GI/Hepatic negative GI ROS, Neg liver ROS,,,  Endo/Other  negative endocrine ROSHypothyroidism    Renal/GU negative Renal ROS  negative genitourinary   Musculoskeletal  (+) Arthritis , Osteoarthritis,    Abdominal   Peds  Hematology negative hematology ROS (+)   Anesthesia Other Findings   Reproductive/Obstetrics negative OB ROS                              Anesthesia Physical Anesthesia Plan  ASA: 2  Anesthesia Plan: General   Post-op Pain Management: Regional block* and Tylenol  PO (pre-op)*   Induction: Intravenous  PONV Risk Score and Plan: 4 or greater and Ondansetron , Dexamethasone , Propofol  infusion and TIVA  Airway Management Planned: Oral ETT  Additional Equipment:   Intra-op Plan:   Post-operative Plan: Extubation in OR  Informed Consent: I have reviewed the patients History and Physical, chart, labs and discussed the procedure including the risks, benefits and alternatives for the proposed anesthesia with the patient or authorized representative who has indicated his/her understanding and acceptance.     Dental advisory given  Plan Discussed with: CRNA  Anesthesia Plan Comments:           Anesthesia Quick Evaluation

## 2024-02-16 NOTE — H&P (Addendum)
 H&P Update:  -History and Physical Reviewed  -Patient has been re-examined  -No change in the plan of care. Updated consent with pt agreement to include possible gastrocnemius recession and/or tendoachilles lengthening.  -The risks and benefits were presented and reviewed. The risks due to hardware/suture failure and/or irritation, new/persistent infection, stiffness, nerve/vessel/tendon injury or rerupture of repaired tendon, nonunion/malunion, allograft usage, wound healing issues, development of arthritis, failure of this surgery, possibility of external fixation with delayed definitive surgery, need for further surgery, thromboembolic events, anesthesia/medical complications, amputation, death among others were discussed. The patient acknowledged the explanation, agreed to proceed with the plan and a consent was signed.  Lillia Mountain

## 2024-02-17 ENCOUNTER — Encounter (HOSPITAL_BASED_OUTPATIENT_CLINIC_OR_DEPARTMENT_OTHER): Payer: Self-pay | Admitting: Orthopaedic Surgery

## 2024-02-22 ENCOUNTER — Other Ambulatory Visit: Payer: Self-pay | Admitting: Diagnostic Neuroimaging

## 2024-02-22 NOTE — Telephone Encounter (Signed)
 Last filled by patient on 11/28/23 Last office visit : 01/05/23 Next office visit : 03/23/24 -continue carb/levo 2 tabs in the evening for RLS

## 2024-02-23 ENCOUNTER — Other Ambulatory Visit: Payer: Self-pay | Admitting: Diagnostic Neuroimaging

## 2024-03-06 ENCOUNTER — Telehealth: Payer: Self-pay | Admitting: Diagnostic Neuroimaging

## 2024-03-06 NOTE — Telephone Encounter (Signed)
 Pt made initial request to cx appointment with Dr Margaret and be scheduled for 1st available with NP.  Pt then asked the NP appointment be cx and asked to be put back on appointment w/ Dr Margaret.

## 2024-03-13 ENCOUNTER — Other Ambulatory Visit: Payer: Self-pay | Admitting: Diagnostic Neuroimaging

## 2024-03-13 DIAGNOSIS — M6281 Muscle weakness (generalized): Secondary | ICD-10-CM | POA: Diagnosis not present

## 2024-03-13 DIAGNOSIS — M25671 Stiffness of right ankle, not elsewhere classified: Secondary | ICD-10-CM | POA: Diagnosis not present

## 2024-03-13 DIAGNOSIS — Z9889 Other specified postprocedural states: Secondary | ICD-10-CM | POA: Diagnosis not present

## 2024-03-13 DIAGNOSIS — M25571 Pain in right ankle and joints of right foot: Secondary | ICD-10-CM | POA: Diagnosis not present

## 2024-03-13 DIAGNOSIS — R2689 Other abnormalities of gait and mobility: Secondary | ICD-10-CM | POA: Diagnosis not present

## 2024-03-16 DIAGNOSIS — R2689 Other abnormalities of gait and mobility: Secondary | ICD-10-CM | POA: Diagnosis not present

## 2024-03-16 DIAGNOSIS — M25571 Pain in right ankle and joints of right foot: Secondary | ICD-10-CM | POA: Diagnosis not present

## 2024-03-16 DIAGNOSIS — Z9889 Other specified postprocedural states: Secondary | ICD-10-CM | POA: Diagnosis not present

## 2024-03-16 DIAGNOSIS — M6281 Muscle weakness (generalized): Secondary | ICD-10-CM | POA: Diagnosis not present

## 2024-03-16 DIAGNOSIS — M25671 Stiffness of right ankle, not elsewhere classified: Secondary | ICD-10-CM | POA: Diagnosis not present

## 2024-03-16 NOTE — Telephone Encounter (Signed)
 Last filled by patient on 12/23/2023 Last office visit :  01/05/23 Next office visit : 03/23/24  Medication will be refilled at the time of the patient's appointment

## 2024-03-20 ENCOUNTER — Other Ambulatory Visit: Payer: Self-pay | Admitting: Diagnostic Neuroimaging

## 2024-03-21 DIAGNOSIS — M25671 Stiffness of right ankle, not elsewhere classified: Secondary | ICD-10-CM | POA: Diagnosis not present

## 2024-03-21 DIAGNOSIS — Z9889 Other specified postprocedural states: Secondary | ICD-10-CM | POA: Diagnosis not present

## 2024-03-21 DIAGNOSIS — M6281 Muscle weakness (generalized): Secondary | ICD-10-CM | POA: Diagnosis not present

## 2024-03-21 DIAGNOSIS — M25571 Pain in right ankle and joints of right foot: Secondary | ICD-10-CM | POA: Diagnosis not present

## 2024-03-21 DIAGNOSIS — R2689 Other abnormalities of gait and mobility: Secondary | ICD-10-CM | POA: Diagnosis not present

## 2024-03-23 ENCOUNTER — Ambulatory Visit: Admitting: Diagnostic Neuroimaging

## 2024-03-23 ENCOUNTER — Encounter: Payer: Self-pay | Admitting: Diagnostic Neuroimaging

## 2024-03-23 VITALS — BP 130/88 | HR 65 | Ht 60.0 in | Wt 147.6 lb

## 2024-03-23 DIAGNOSIS — M6281 Muscle weakness (generalized): Secondary | ICD-10-CM | POA: Diagnosis not present

## 2024-03-23 DIAGNOSIS — M25671 Stiffness of right ankle, not elsewhere classified: Secondary | ICD-10-CM | POA: Diagnosis not present

## 2024-03-23 DIAGNOSIS — R2689 Other abnormalities of gait and mobility: Secondary | ICD-10-CM | POA: Diagnosis not present

## 2024-03-23 DIAGNOSIS — Z9889 Other specified postprocedural states: Secondary | ICD-10-CM | POA: Diagnosis not present

## 2024-03-23 DIAGNOSIS — G250 Essential tremor: Secondary | ICD-10-CM | POA: Diagnosis not present

## 2024-03-23 DIAGNOSIS — M25571 Pain in right ankle and joints of right foot: Secondary | ICD-10-CM | POA: Diagnosis not present

## 2024-03-23 MED ORDER — CARBIDOPA-LEVODOPA 25-100 MG PO TABS: 2.0000 | ORAL_TABLET | Freq: Two times a day (BID) | ORAL | 12 refills | Status: AC

## 2024-03-23 MED ORDER — PRIMIDONE 50 MG PO TABS: 150.0000 mg | ORAL_TABLET | Freq: Two times a day (BID) | ORAL | 3 refills | Status: AC

## 2024-03-23 NOTE — Progress Notes (Signed)
 REFERRING PROVIDER: Verdia Lombard, MD    Chief Complaint  Patient presents with   RM 6     Patient is here with husband for parkinson's/ tremors - shaking has gotten worse in her hands. Would like to discuss medications. Carb/levo helps with restless legs. Would like to know if taking it also in the day help her symptoms    History of Present Illness:  UPDATE (03/23/24, VRP): Since last visit, more progression of essential tremor symptoms. More issues with holding utensils and activities.   UPDATE (01/05/23, VRP): Since last visit, doing well. Symptoms are stable. Severity is mild-moderate. No alleviating or aggravating factors. Tolerating meds.    UPDATE (10/27/22, VRP): Since last visit, doing better on carb/levo. However more postural / action tremor in last few months. Balance is stable.   UPDATE (12/30/21, VRP): Since last visit, doing well. Had T12-L1 surgery in June 2023.  Parkinson symptoms are stable. No alleviating or aggravating factors. Tolerating meds.    UPDATE (07/01/21, VRP): Since last visit, doing well, except at she reduced carb/levo on her own (inadvertantly) and now tremors worse.   UPDATE (12/31/20, VRP): Since last visit, doing about the same. Resting tremor and balance stable. Postural tremor more bothersome. Taking 1-2 tabs carb/levo; three times a day; nausea limits dosing.   UPDATE (12/26/19, VRP): Since last visit, doing well. Symptoms are slightly progressed. Severity mild-mod. No alleviating or aggravating factors. Tolerating meds. Taking carb/levo 3-4 tabs per day. Tremor slightly increased. Balance stable.  UPDATE (12/20/18, VRP): Since last visit, doing well. Symptoms are stable. Severity is mild. No alleviating or aggravating factors. Tolerating meds. Some afternoon tremors are noticeable. Has had some falls.   UPDATE (03/21/18, VRP): Since last visit, doing more head tremor issues in last 3-4 months. Symptoms are progressive and mild. No alleviating  or aggravating factors. Tolerating meds.    UPDATE (04/05/17, VRP): Since last visit, doing well. Tolerating meds. No alleviating or aggravating factors. Carb/levo is helping. Patient taking twice a day.   UPDATE 09/29/16: Since last visit, doing well. Carb/levo helping tremor control. Mainly taking twice a day. No other new symptoms.  UPDATE 06/23/16: Since last visit no change in sxs. MRI reviewed. Ready to start carb/levo.   PRIOR HPI (03/11/16): 78 year old female here for evaluation of tremor. Patient is right handed and has hypertension. For past 8-10 years, patient has had intermittent postural and action tremor. This is progressively worsened over time. More recently she has noticed subtle resting tremor. Patient is having more difficulty with holding objects, drinking from a cup, handwriting. Symptoms fluctuate from day to day. She has a glass of wine sometimes this calms down some of her tremor. Patient has been on propranolol  for blood pressure control, and was advised to take an extra tablet of this to help with tremor control. However patient has not tried taking the extra propranolol . Patient has significant family history for tremor. Patient's mother had tremor but not specifically diagnosed. Patient's mother also had polycythemia vera and later leukemia. Patient's son has tremor starting at age 31 years old. Patient's maternal aunts 2 and maternal uncle 1 at Parkinson's disease. Several first cousins on her mother's side also have Parkinson's disease. A different maternal uncle had multiple sclerosis. Patient also has noted some hoarse voice, intermittent choking sensation, mild drooling from the right side of her mouth. No change in smell or taste. No significant anxiety or constipation. No leg tremor. She has some mild balance and walking difficulty.  Patient  has had multiple cervical spine surgeries in 2003, 2007 in 2014. 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 She is also  had lumbar spine surgeries in 2001 and 2014. Patient had some complication of surgery including hoarse voice.  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 Conventional angiogram was performed which showed tortuous right ICA without evidence of dissection.      Observations/Objective:  GENERAL EXAM/CONSTITUTIONAL: Vitals:  Vitals:   03/23/24 1516  BP: 130/88  Pulse: 65  SpO2: 96%  Weight: 147 lb 9.6 oz (67 kg)  Height: 5' (1.524 m)   Body mass index is 28.83 kg/m. Wt Readings from Last 3 Encounters:  03/23/24 147 lb 9.6 oz (67 kg)  02/16/24 147 lb 7.8 oz (66.9 kg)  12/14/23 150 lb 12.8 oz (68.4 kg)   Patient is in no distress; well developed, nourished and groomed; neck is supple  CARDIOVASCULAR: Examination of carotid arteries is normal; no carotid bruits Regular rate and rhythm, no murmurs Examination of peripheral vascular system by observation and palpation is normal  EYES: Ophthalmoscopic exam of optic discs and posterior segments is normal; no papilledema or hemorrhages No results found.  MUSCULOSKELETAL: Gait, strength, tone, movements noted in Neurologic exam below  NEUROLOGIC: MENTAL STATUS:      No data to display         awake, alert, oriented to person, place and time recent and remote memory intact normal attention and concentration language fluent, comprehension intact, naming intact fund of knowledge appropriate  CRANIAL NERVE:  2nd - no papilledema on fundoscopic exam 2nd, 3rd, 4th, 6th - pupils equal and reactive to light, visual fields full to confrontation, extraocular muscles intact, no nystagmus 5th - facial sensation symmetric 7th - facial strength symmetric 8th - hearing intact 9th - palate elevates symmetrically, uvula midline 11th - shoulder shrug symmetric 12th - tongue protrusion midline VOICE TREMOR; HEAD TREMOR  MOTOR:  MILD  POSTURAL AND ACTION TREMOR IN BUE NO COGWHEELING; NO BRADYKINESIA normal bulk and tone, full strength in the BUE, BLE  SENSORY:  normal and symmetric to light touch, temperature, vibration  COORDINATION:  finger-nose-finger, fine finger movements normal  REFLEXES:  deep tendon reflexes TRACE and symmetric  GAIT/STATION:  narrow based gait; GOOD STRIDE AND ARM SWING    01/17/21 DATscan  Normal Ioflupane scan. No reduced radiotracer activity in basal ganglia to suggest Parkinson's syndrome pathology.   Assessment and Plan:  Dx:  1. 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    Dispense:  540 tablet    Refill:  3   carbidopa -levodopa  (SINEMET  IR) 25-100 MG tablet    Sig: Take 2 tablets by mouth 2 (two) times daily.  Dispense:  120 tablet    Refill:  12    Follow Up Instructions:  - Return in about 9 months (around 12/21/2024).     EDUARD FABIENE HANLON, MD 03/23/2024, 3:51 PM Certified in Neurology, Neurophysiology and Neuroimaging  [facility] [address] [place], [place] [address] [phone]

## 2024-03-23 NOTE — Patient Instructions (Addendum)
  ESSENTIAL TREMOR (worsening) - continue propranolol  80mg  daily for essential tremor and BP control - continue primidone  150mg  twice a day  - carb/levo 2 tabs in the evening for RLS; will increase to 2 tabs twice a day  - consider DBS vs focused ultrasound treatment evaluation at [facility] movement disorder clinic  RLS (stable) - carb/levo 2 tabs in the evening for RLS; will increase to 2 tabs twice a day

## 2024-03-24 DIAGNOSIS — Z1231 Encounter for screening mammogram for malignant neoplasm of breast: Secondary | ICD-10-CM | POA: Diagnosis not present

## 2024-03-27 ENCOUNTER — Telehealth: Payer: Self-pay | Admitting: Diagnostic Neuroimaging

## 2024-03-27 DIAGNOSIS — Z23 Encounter for immunization: Secondary | ICD-10-CM | POA: Diagnosis not present

## 2024-03-27 NOTE — Telephone Encounter (Signed)
 Referral for neurology fax to [facility]. Phone: [phone], Fax:[phone]

## 2024-03-28 DIAGNOSIS — R2689 Other abnormalities of gait and mobility: Secondary | ICD-10-CM | POA: Diagnosis not present

## 2024-03-28 DIAGNOSIS — M6281 Muscle weakness (generalized): Secondary | ICD-10-CM | POA: Diagnosis not present

## 2024-03-28 DIAGNOSIS — M25671 Stiffness of right ankle, not elsewhere classified: Secondary | ICD-10-CM | POA: Diagnosis not present

## 2024-03-28 DIAGNOSIS — M25571 Pain in right ankle and joints of right foot: Secondary | ICD-10-CM | POA: Diagnosis not present

## 2024-03-28 DIAGNOSIS — Z9889 Other specified postprocedural states: Secondary | ICD-10-CM | POA: Diagnosis not present

## 2024-03-29 DIAGNOSIS — M25671 Stiffness of right ankle, not elsewhere classified: Secondary | ICD-10-CM | POA: Diagnosis not present

## 2024-03-29 DIAGNOSIS — Z9889 Other specified postprocedural states: Secondary | ICD-10-CM | POA: Diagnosis not present

## 2024-03-29 DIAGNOSIS — R2689 Other abnormalities of gait and mobility: Secondary | ICD-10-CM | POA: Diagnosis not present

## 2024-03-29 DIAGNOSIS — M6281 Muscle weakness (generalized): Secondary | ICD-10-CM | POA: Diagnosis not present

## 2024-03-29 DIAGNOSIS — M25571 Pain in right ankle and joints of right foot: Secondary | ICD-10-CM | POA: Diagnosis not present

## 2024-04-05 DIAGNOSIS — M25671 Stiffness of right ankle, not elsewhere classified: Secondary | ICD-10-CM | POA: Diagnosis not present

## 2024-04-05 DIAGNOSIS — M6281 Muscle weakness (generalized): Secondary | ICD-10-CM | POA: Diagnosis not present

## 2024-04-05 DIAGNOSIS — R2689 Other abnormalities of gait and mobility: Secondary | ICD-10-CM | POA: Diagnosis not present

## 2024-04-05 DIAGNOSIS — M25571 Pain in right ankle and joints of right foot: Secondary | ICD-10-CM | POA: Diagnosis not present

## 2024-04-10 DIAGNOSIS — M25671 Stiffness of right ankle, not elsewhere classified: Secondary | ICD-10-CM | POA: Diagnosis not present

## 2024-04-10 DIAGNOSIS — R2689 Other abnormalities of gait and mobility: Secondary | ICD-10-CM | POA: Diagnosis not present

## 2024-04-10 DIAGNOSIS — M6281 Muscle weakness (generalized): Secondary | ICD-10-CM | POA: Diagnosis not present

## 2024-04-10 DIAGNOSIS — M25571 Pain in right ankle and joints of right foot: Secondary | ICD-10-CM | POA: Diagnosis not present

## 2024-04-12 DIAGNOSIS — M25571 Pain in right ankle and joints of right foot: Secondary | ICD-10-CM | POA: Diagnosis not present

## 2024-04-12 DIAGNOSIS — M6281 Muscle weakness (generalized): Secondary | ICD-10-CM | POA: Diagnosis not present

## 2024-04-12 DIAGNOSIS — M25671 Stiffness of right ankle, not elsewhere classified: Secondary | ICD-10-CM | POA: Diagnosis not present

## 2024-04-12 DIAGNOSIS — R2689 Other abnormalities of gait and mobility: Secondary | ICD-10-CM | POA: Diagnosis not present

## 2024-04-17 DIAGNOSIS — R2689 Other abnormalities of gait and mobility: Secondary | ICD-10-CM | POA: Diagnosis not present

## 2024-04-17 DIAGNOSIS — M25671 Stiffness of right ankle, not elsewhere classified: Secondary | ICD-10-CM | POA: Diagnosis not present

## 2024-04-17 DIAGNOSIS — M6281 Muscle weakness (generalized): Secondary | ICD-10-CM | POA: Diagnosis not present

## 2024-04-17 DIAGNOSIS — M25571 Pain in right ankle and joints of right foot: Secondary | ICD-10-CM | POA: Diagnosis not present

## 2024-04-19 DIAGNOSIS — M25671 Stiffness of right ankle, not elsewhere classified: Secondary | ICD-10-CM | POA: Diagnosis not present

## 2024-04-19 DIAGNOSIS — M25571 Pain in right ankle and joints of right foot: Secondary | ICD-10-CM | POA: Diagnosis not present

## 2024-04-19 DIAGNOSIS — M6281 Muscle weakness (generalized): Secondary | ICD-10-CM | POA: Diagnosis not present

## 2024-04-19 DIAGNOSIS — R2689 Other abnormalities of gait and mobility: Secondary | ICD-10-CM | POA: Diagnosis not present

## 2024-04-22 ENCOUNTER — Other Ambulatory Visit: Payer: Self-pay | Admitting: Diagnostic Neuroimaging

## 2024-04-24 DIAGNOSIS — G250 Essential tremor: Secondary | ICD-10-CM | POA: Diagnosis not present

## 2024-04-24 NOTE — Telephone Encounter (Signed)
 Last refilled by patient on : 12/23/23 Last office visit : 03/23/24 Next office visit : 12/26/23 Per last office visit - continue propranolol  80mg  daily for essential tremor and BP control

## 2024-09-19 ENCOUNTER — Ambulatory Visit: Admitting: Adult Health

## 2024-12-25 ENCOUNTER — Ambulatory Visit: Admitting: Diagnostic Neuroimaging
